# Patient Record
Sex: Male | Born: 1950 | Race: White | Hispanic: No | Smoking: Current some day smoker
Health system: Southern US, Community
[De-identification: ages and names within clinical notes are randomized; demographics above are authoritative.]

## PROBLEM LIST (undated history)

## (undated) DIAGNOSIS — Z8719 Personal history of other diseases of the digestive system: Secondary | ICD-10-CM

## (undated) DIAGNOSIS — F419 Anxiety disorder, unspecified: Secondary | ICD-10-CM

## (undated) DIAGNOSIS — I428 Other cardiomyopathies: Secondary | ICD-10-CM

## (undated) DIAGNOSIS — I5022 Chronic systolic (congestive) heart failure: Secondary | ICD-10-CM

## (undated) DIAGNOSIS — I10 Essential (primary) hypertension: Secondary | ICD-10-CM

## (undated) DIAGNOSIS — Z91199 Patient's noncompliance with other medical treatment and regimen due to unspecified reason: Secondary | ICD-10-CM

## (undated) DIAGNOSIS — I729 Aneurysm of unspecified site: Secondary | ICD-10-CM

## (undated) DIAGNOSIS — R748 Abnormal levels of other serum enzymes: Secondary | ICD-10-CM

## (undated) DIAGNOSIS — E785 Hyperlipidemia, unspecified: Secondary | ICD-10-CM

## (undated) DIAGNOSIS — M1990 Unspecified osteoarthritis, unspecified site: Secondary | ICD-10-CM

## (undated) DIAGNOSIS — Z9119 Patient's noncompliance with other medical treatment and regimen: Secondary | ICD-10-CM

## (undated) DIAGNOSIS — I4892 Unspecified atrial flutter: Secondary | ICD-10-CM

## (undated) DIAGNOSIS — R001 Bradycardia, unspecified: Secondary | ICD-10-CM

## (undated) DIAGNOSIS — Z720 Tobacco use: Secondary | ICD-10-CM

## (undated) DIAGNOSIS — I2510 Atherosclerotic heart disease of native coronary artery without angina pectoris: Secondary | ICD-10-CM

## (undated) DIAGNOSIS — Z87448 Personal history of other diseases of urinary system: Secondary | ICD-10-CM

## (undated) DIAGNOSIS — I480 Paroxysmal atrial fibrillation: Secondary | ICD-10-CM

## (undated) DIAGNOSIS — T81718A Complication of other artery following a procedure, not elsewhere classified, initial encounter: Secondary | ICD-10-CM

## (undated) HISTORY — DX: Anxiety disorder, unspecified: F41.9

## (undated) HISTORY — DX: Personal history of other diseases of the digestive system: Z87.19

## (undated) HISTORY — DX: Personal history of other diseases of urinary system: Z87.448

## (undated) HISTORY — PX: HERNIA REPAIR: SHX51

## (undated) HISTORY — PX: CHOLECYSTECTOMY: SHX55

## (undated) HISTORY — DX: Unspecified osteoarthritis, unspecified site: M19.90

## (undated) HISTORY — DX: Other cardiomyopathies: I42.8

---
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
## 2003-02-24 ENCOUNTER — Emergency Department (HOSPITAL_COMMUNITY): Admission: EM | Admit: 2003-02-24 | Discharge: 2003-02-24 | Payer: Self-pay | Admitting: Emergency Medicine

## 2003-02-24 ENCOUNTER — Encounter: Payer: Self-pay | Admitting: Emergency Medicine

## 2003-03-23 ENCOUNTER — Encounter (INDEPENDENT_AMBULATORY_CARE_PROVIDER_SITE_OTHER): Payer: Self-pay | Admitting: Specialist

## 2003-03-23 ENCOUNTER — Ambulatory Visit (HOSPITAL_BASED_OUTPATIENT_CLINIC_OR_DEPARTMENT_OTHER): Admission: RE | Admit: 2003-03-23 | Discharge: 2003-03-23 | Payer: Self-pay | Admitting: Urology

## 2003-03-26 ENCOUNTER — Encounter: Payer: Self-pay | Admitting: Emergency Medicine

## 2003-03-26 ENCOUNTER — Emergency Department (HOSPITAL_COMMUNITY): Admission: EM | Admit: 2003-03-26 | Discharge: 2003-03-26 | Payer: Self-pay | Admitting: Emergency Medicine

## 2003-03-27 ENCOUNTER — Observation Stay (HOSPITAL_COMMUNITY): Admission: RE | Admit: 2003-03-27 | Discharge: 2003-03-28 | Payer: Self-pay | Admitting: Urology

## 2003-03-27 ENCOUNTER — Encounter: Payer: Self-pay | Admitting: Urology

## 2009-05-22 ENCOUNTER — Ambulatory Visit (HOSPITAL_COMMUNITY): Admission: RE | Admit: 2009-05-22 | Discharge: 2009-05-22 | Payer: Self-pay | Admitting: Surgery

## 2009-05-22 ENCOUNTER — Encounter: Payer: Self-pay | Admitting: Internal Medicine

## 2009-05-23 DIAGNOSIS — M129 Arthropathy, unspecified: Secondary | ICD-10-CM | POA: Insufficient documentation

## 2009-05-23 DIAGNOSIS — F411 Generalized anxiety disorder: Secondary | ICD-10-CM | POA: Insufficient documentation

## 2009-05-23 DIAGNOSIS — Z87448 Personal history of other diseases of urinary system: Secondary | ICD-10-CM | POA: Insufficient documentation

## 2009-05-23 DIAGNOSIS — Z87898 Personal history of other specified conditions: Secondary | ICD-10-CM | POA: Insufficient documentation

## 2009-05-24 ENCOUNTER — Ambulatory Visit: Payer: Self-pay | Admitting: Internal Medicine

## 2009-05-24 DIAGNOSIS — I4892 Unspecified atrial flutter: Secondary | ICD-10-CM | POA: Insufficient documentation

## 2009-05-24 LAB — CONVERTED CEMR LAB
Free T4: 1 ng/dL (ref 0.6–1.6)
TSH: 0.98 microintl units/mL (ref 0.35–5.50)

## 2009-05-25 ENCOUNTER — Encounter: Payer: Self-pay | Admitting: Internal Medicine

## 2009-05-25 ENCOUNTER — Ambulatory Visit: Payer: Self-pay

## 2009-05-31 ENCOUNTER — Telehealth: Payer: Self-pay | Admitting: Internal Medicine

## 2009-06-06 ENCOUNTER — Encounter (INDEPENDENT_AMBULATORY_CARE_PROVIDER_SITE_OTHER): Payer: Self-pay | Admitting: *Deleted

## 2009-07-05 ENCOUNTER — Ambulatory Visit (HOSPITAL_COMMUNITY): Admission: RE | Admit: 2009-07-05 | Discharge: 2009-07-05 | Payer: Self-pay | Admitting: Surgery

## 2009-07-27 ENCOUNTER — Encounter: Payer: Self-pay | Admitting: Internal Medicine

## 2009-07-30 ENCOUNTER — Ambulatory Visit: Payer: Self-pay | Admitting: Internal Medicine

## 2010-10-15 NOTE — Assessment & Plan Note (Signed)
Summary: per check out/sf   Visit Type:  Follow-up Referring Deniah Saia:  Harriette Bouillon, MD Primary Analeya Luallen:  Dr.Fred Andrey Campanile   History of Present Illness: The patient presents today for routine electrophysiology followup. He reports doing very well since last being seen in our clinic. He underwent R inguinal hernia repair recently, without cardiac events.  The patient denies symptoms of palpitations, chest pain, shortness of breath, orthopnea, PND, lower extremity edema, dizziness, presyncope, syncope, or neurologic sequela. The patient is tolerating medications without difficulties and is otherwise without complaint today.   Current Medications (verified): 1)  Xanax 1 Mg Tabs (Alprazolam) .Marland Kitchen.. 1 By Mouth At Bedtime As Needed 2)  Cardizem Cd 180 Mg Xr24h-Cap (Diltiazem Hcl Coated Beads) .... One By Mouth Once Daily  Allergies (verified): No Known Drug Allergies  Past History:  Past Medical History: Reviewed history from 05/24/2009 and no changes required. ANXIETY (ICD-300.00) ARTHRITIS (ICD-716.90) INGUINAL HERNIA, HX OF (ICD-V13.8) ATRIAL ARRHYTHMIAS (ICD-427.89)  Past Surgical History: L inguinal hernia repair 92 R inguinal hernia repair 2004, 2010 Appendectomy  Family History: Reviewed history from 05/24/2009 and no changes required. Father died in a car wreck.  Mother is alive at age 26.  Brother is alive at age 20.  Denies FH of early CAD or sudden death.  Mother has atrial fibrillation.   Social History: Reviewed history from 05/24/2009 and no changes required. Lives in [place] with wife.  4 children (all healthy) Works at [employer] in [place]. Tobacco Use - Yes.  1/2 PPD, trying to quit Alcohol Use - 1-2 beers every few days Drug Use - no  Review of Systems       All systems are reviewed and negative except as listed in the HPI.   Vital Signs:  Patient profile:   60 year old male Height:      67 inches Weight:      146 pounds BMI:      22.95 Pulse rate:   59 / minute BP sitting:   126 / 72  (left arm) Cuff size:   regular  Vitals Entered By: Hardin Negus, RMA (July 30, 2009 3:32 PM)  Physical Exam  General:  Well developed, well nourished, in no acute distress. Head:  normocephalic and atraumatic Eyes:  PERRLA/EOM intact; conjunctiva and lids normal. Nose:  no deformity, discharge, inflammation, or lesions Mouth:  Teeth, gums and palate normal. Oral mucosa normal. Neck:  Neck supple, no JVD. No masses, thyromegaly or abnormal cervical nodes. Lungs:  Clear bilaterally to auscultation and percussion. Heart:  Non-displaced PMI, chest non-tender; regular rate and rhythm, S1, S2 without murmurs, rubs or gallops. Carotid upstroke normal, no bruit. Normal abdominal aortic size, no bruits. Femorals normal pulses, no bruits. Pedals normal pulses. No edema, no varicosities. Abdomen:  Bowel sounds positive; abdomen soft and non-tender without masses, organomegaly, or hernias noted. No hepatosplenomegaly.  Healing surgical scar Msk:  Back normal, normal gait. Muscle strength and tone normal. Pulses:  pulses normal in all 4 extremities Extremities:  No clubbing or cyanosis. Neurologic:  Alert and oriented x 3. Skin:  Intact without lesions or rashes. Cervical Nodes:  no significant adenopathy Psych:  Normal affect.   EKG  Procedure date:  07/30/2009  Findings:      sinus rhythm 60 bpm, incomplete RBBB  Impression & Recommendations:  Problem # 1:  ATRIAL FLUTTER (ICD-427.32) The patient presents for follow-up of his atrial arrhythmias.  He has done well without symptoms of further arrhythmias. His recent echo was normal.  He was previously discovered to have atrial arrhythmias including PACs, atrial flutter, and afib documented on a preoperative EKG 05/22/09.  He presents today in sinus rhythm.  We will continue rate control with diltiazem longterm.  His CHADS 2 score is 0.  I would therefore recommend ASA 325mg  daily  for stroke prevention longterm.  He will return if he develops sypmtoms of arrhythmia.  Otherwise he will follow closely with his PCP.  Patient Instructions: 1)  Return as needed

## 2010-10-15 NOTE — Letter (Signed)
Summary: Appointment - Reschedule  [facility], Main Office  [address]   [place], [place] [address]   Phone: [phone]  Fax: [phone]     June 06, 2009 MRN: [id number]   Peter Mclean [address] [place], [place]  [address]   Dear [name],   Due to a change in our office schedule, your appointment on June 29, 2009 at  4:00 pm  must be changed.  It is very important that we reach you to reschedule this appointment.   We look forward to participating in your health care needs. Please contact us at the number listed above at your earliest convenience to reschedule this appointment.  Sincerely,  Burnard Leigh [facility] Scheduling Team

## 2010-10-15 NOTE — Miscellaneous (Signed)
  Clinical Lists Changes  Observations: Added new observation of CXR RESULTS: The lungs are clear.  The heart is normal in size.  There   is no evidence for adenopathy.    IMPRESSION:   No evidence for active chest disease. (07/02/2009 11:07) Added new observation of ECHOINTERP: Left ventricle: The cavity size was normal. Wall thickness was     normal. Systolic function was normal. The estimated ejection     fraction was in the range of 55% to 60%.   (05/25/2009 11:06)      Echocardiogram  Procedure date:  05/25/2009  Findings:      Left ventricle: The cavity size was normal. Wall thickness was     normal. Systolic function was normal. The estimated ejection     fraction was in the range of 55% to 60%.    CXR  Procedure date:  07/02/2009  Findings:      The lungs are clear.  The heart is normal in size.  There   is no evidence for adenopathy.    IMPRESSION:   No evidence for active chest disease.

## 2010-10-15 NOTE — Assessment & Plan Note (Signed)
Summary: nep/afib/aflutter/jml   Visit Type:  Initial Consult Referring Zaakirah Kistner:  Harriette Bouillon, MD Primary Treg Diemer:  Dr.Fred Andrey Campanile  CC:  atrial arrhythmias.  History of Present Illness: Peter Mclean is a pleasant 60 yo WM without significant cardiac history who presents today for preoperative clearance after being discovered to have atrial arrhythmias.  He reports being in good health recently.  He walks 1/2 mile per day without symptoms of ischemia.  He plays with grandchildren and uses a Actuary without difficulty.  The patient denies any symptoms of palpitations, chest pain, shortness of breath, orthopnea, PND, lower extremity edema, dizziness, presyncope, syncope, or neurologic sequela.  He was recently seen by anesthesia in preoperative evaluation for hernia repair.  He was observed to have very frequency PACs, nonsustained atrial flutter, and atrial fibrillation on EKG.  The patient was asymptomatic with these.  He now presents for cardiology consultation.  Current Medications (verified): 1)  Xanax 1 Mg Tabs (Alprazolam) .Marland Kitchen.. 1 By Mouth Qhs  Allergies (verified): No Known Drug Allergies  Past History:  Past Medical History: ANXIETY (ICD-300.00) ARTHRITIS (ICD-716.90) INGUINAL HERNIA, HX OF (ICD-V13.8) ATRIAL ARRHYTHMIAS (ICD-427.89)  Past Surgical History: L inguinal hernia repair 92 R inguinal hernia repair 2004 Appendectomy o9  Family History: Father died in a car wreck.  Mother is alive at age 97.  Brother is alive at age 21.  Denies FH of early CAD or sudden death.  Mother has atrial fibrillation.  Social History: Lives in [place] with wife.  4 children (all healthy) Works at [employer] in [place]. Tobacco Use - Yes.  1/2 PPD, trying to quit Alcohol Use - 1-2 beers every few days Drug Use - no  Review of Systems       All systems are reviewed and negative except as listed in the HPI.   Vital Signs:  Patient profile:   60 year old  male Height:      67 inches Weight:      142 pounds BMI:     22.32 Pulse rate:   68 / minute Pulse rhythm:   regular BP sitting:   132 / 92  (left arm) Cuff size:   large  Vitals Entered By: Flonnie Overman (May 24, 2009 9:59 AM)  Physical Exam  General:  Well developed, well nourished, in no acute distress. Head:  normocephalic and atraumatic Eyes:  PERRLA/EOM intact; conjunctiva and lids normal. Nose:  no deformity, discharge, inflammation, or lesions Mouth:  Teeth, gums and palate normal. Oral mucosa normal. Neck:  Neck supple, no JVD. No masses, thyromegaly or abnormal cervical nodes. Lungs:  Clear bilaterally to auscultation and percussion. Heart:  Non-displaced PMI, chest non-tender; regular rate and rhythm, S1, S2 without murmurs, rubs or gallops. Carotid upstroke normal, no bruit. Normal abdominal aortic size, no bruits. Femorals normal pulses, no bruits. Pedals normal pulses. No edema, no varicosities. Abdomen:  Bowel sounds positive; abdomen soft and non-tender without masses, organomegaly,. No hepatosplenomegaly. Msk:  Back normal, normal gait. Muscle strength and tone normal. Pulses:  pulses normal in all 4 extremities Extremities:  No clubbing or cyanosis. Neurologic:  Alert and oriented x 3.  CNII-XII intact, strength/sensation are intact Skin:  Intact without lesions or rashes. Cervical Nodes:  no significant adenopathy Psych:  Normal affect.   EKG  Procedure date:  05/22/2009  Findings:      Multiple EKGs were obtained between 13:32 and 13:46 EKGs document sinus rhythm 67 bpm, no ischemic changes. Frequent PACs are noted as  well as nonsustained atrial flutter and atrial fibrillation  Impression & Recommendations:  Problem # 1:  ATRIAL FLUTTER (ICD-427.32) The patient presents for preoperative clearance after recently being discovered to have atrial arrhythmias including PACs, atrial flutter, and afib documented on a preoperative EKG 05/22/09.  He presents  today in sinus rhythm.  He has been asymptomatic with these atrial arrhythmias.  At this point, I would recommend rate control with diltiazem.  We will start diltiazem 180mg  daily.  We will also obtain TFTs and an echo to evaluate for reversible causes of his atrial arrhythmias.   His CHADS 2 score is 0.  I would therefore recommend ASA 325mg  daily after his inguinal hernia repair for stroke prevention longterm.  Perioperative beta blockade may be necessary. As the patient is low risk for CAD and has no symtoms of ischemia, I would not recommend further ischemic workup as long as his her EF is preserved.  Other Orders: TLB-T4 (Thyrox), Free [phone]) TLB-TSH (Thyroid Stimulating Hormone) (84443-TSH) Echocardiogram (Echo)  Patient Instructions: 1)  Your physician recommends that you schedule a follow-up appointment in: 4 weeks with Dr Johney Frame 2)  Your physician has recommended you make the following change in your medication: start Cardizem 180mg  one daily and start an Aspirin 325mg  one daily after your procedure 3)  Your physician has requested that you have an echocardiogram.  Echocardiography is a painless test that uses sound waves to create images of your heart. It provides your doctor with information about the size and shape of your heart and how well your heart's chambers and valves are working.  This procedure takes approximately one hour. There are no restrictions for this procedure. Prescriptions: CARDIZEM CD 180 MG XR24H-CAP (DILTIAZEM HCL COATED BEADS) one by mouth once daily  #30 x 11   Entered by:   Dennis Bast, RN, BSN   Authorized by:   Hillis Range, MD   Signed by:   Dennis Bast, RN, BSN on 05/24/2009   Method used:   Electronically to        [facility]* (retail)       [address] [place], [place]  [address]       Ph: [phone] or [phone]       Fax: [phone]   RxID:   [id number]

## 2010-10-15 NOTE — Progress Notes (Signed)
Summary: surgical clearance  Phone Note From Other Clinic   Caller: Nurse Summary of Call: Per Germaine with Dr Luisa Hart please fax over letter that pt is cleared for hernia surgery. fax [phone] phone. Initial call taken by: Edman Circle,  May 31, 2009 3:51 PM  Follow-up for Phone Call        Will dictate clearance letter and fax today. Follow-up by: Hillis Range, MD,  June 04, 2009 9:07 AM

## 2010-10-30 ENCOUNTER — Other Ambulatory Visit: Payer: Self-pay | Admitting: Gastroenterology

## 2010-10-30 DIAGNOSIS — R7989 Other specified abnormal findings of blood chemistry: Secondary | ICD-10-CM

## 2010-11-06 ENCOUNTER — Ambulatory Visit
Admission: RE | Admit: 2010-11-06 | Discharge: 2010-11-06 | Disposition: A | Discharge status: Home / Self Care | Payer: PRIVATE HEALTH INSURANCE | Source: Ambulatory Visit | Attending: Gastroenterology | Admitting: Gastroenterology

## 2010-11-06 DIAGNOSIS — R7989 Other specified abnormal findings of blood chemistry: Secondary | ICD-10-CM

## 2010-12-03 ENCOUNTER — Other Ambulatory Visit: Payer: Self-pay | Admitting: General Surgery

## 2010-12-03 ENCOUNTER — Encounter (HOSPITAL_COMMUNITY): Payer: PRIVATE HEALTH INSURANCE

## 2010-12-03 ENCOUNTER — Other Ambulatory Visit (HOSPITAL_COMMUNITY): Payer: Self-pay | Admitting: General Surgery

## 2010-12-03 ENCOUNTER — Ambulatory Visit (HOSPITAL_COMMUNITY)
Admission: RE | Admit: 2010-12-03 | Discharge: 2010-12-03 | Disposition: A | Discharge status: Home / Self Care | Payer: PRIVATE HEALTH INSURANCE | Source: Ambulatory Visit | Attending: General Surgery | Admitting: General Surgery

## 2010-12-03 DIAGNOSIS — Z01818 Encounter for other preprocedural examination: Secondary | ICD-10-CM

## 2010-12-03 DIAGNOSIS — Z01810 Encounter for preprocedural cardiovascular examination: Secondary | ICD-10-CM | POA: Insufficient documentation

## 2010-12-03 DIAGNOSIS — Z01812 Encounter for preprocedural laboratory examination: Secondary | ICD-10-CM | POA: Insufficient documentation

## 2010-12-03 DIAGNOSIS — I498 Other specified cardiac arrhythmias: Secondary | ICD-10-CM | POA: Insufficient documentation

## 2010-12-03 LAB — DIFFERENTIAL
Basophils Absolute: 0 10*3/uL (ref 0.0–0.1)
Basophils Relative: 0 % (ref 0–1)
Eosinophils Absolute: 0.3 10*3/uL (ref 0.0–0.7)
Eosinophils Relative: 3 % (ref 0–5)
Lymphocytes Relative: 34 % (ref 12–46)
Lymphs Abs: 3.5 10*3/uL (ref 0.7–4.0)
Monocytes Absolute: 1 10*3/uL (ref 0.1–1.0)
Monocytes Relative: 9 % (ref 3–12)
Neutro Abs: 5.6 10*3/uL (ref 1.7–7.7)
Neutrophils Relative %: 54 % (ref 43–77)

## 2010-12-03 LAB — COMPREHENSIVE METABOLIC PANEL
ALT: 24 U/L (ref 0–53)
AST: 24 U/L (ref 0–37)
Albumin: 3.9 g/dL (ref 3.5–5.2)
Alkaline Phosphatase: 89 U/L (ref 39–117)
BUN: 18 mg/dL (ref 6–23)
CO2: 28 mEq/L (ref 19–32)
Calcium: 9.4 mg/dL (ref 8.4–10.5)
Chloride: 106 mEq/L (ref 96–112)
Creatinine, Ser: 1.01 mg/dL (ref 0.4–1.5)
GFR calc Af Amer: >60 mL/min (ref >60)
GFR calc non Af Amer: >60 mL/min (ref >60)
Glucose, Bld: 90 mg/dL (ref 70–99)
Potassium: 4.1 mEq/L (ref 3.5–5.1)
Sodium: 141 mEq/L (ref 135–145)
Total Bilirubin: 0.9 mg/dL (ref 0.3–1.2)
Total Protein: 7.1 g/dL (ref 6.0–8.3)

## 2010-12-03 LAB — CBC
HCT: 46.1 % (ref 39.0–52.0)
Hemoglobin: 15.3 g/dL (ref 13.0–17.0)
MCH: 30.4 pg (ref 26.0–34.0)
MCHC: 33.2 g/dL (ref 30.0–36.0)
MCV: 91.5 fL (ref 78.0–100.0)
Platelets: 203 10*3/uL (ref 150–400)
RBC: 5.04 MIL/uL (ref 4.22–5.81)
RDW: 13.2 % (ref 11.5–15.5)
WBC: 10.4 10*3/uL (ref 4.0–10.5)

## 2010-12-03 LAB — SURGICAL PCR SCREEN
MRSA, PCR: NEGATIVE
Staphylococcus aureus: NEGATIVE

## 2010-12-11 ENCOUNTER — Other Ambulatory Visit: Payer: Self-pay | Admitting: General Surgery

## 2010-12-11 ENCOUNTER — Ambulatory Visit (HOSPITAL_COMMUNITY)
Admission: RE | Admit: 2010-12-11 | Discharge: 2010-12-11 | Disposition: A | Discharge status: Home / Self Care | Payer: PRIVATE HEALTH INSURANCE | Source: Ambulatory Visit | Attending: General Surgery | Admitting: General Surgery

## 2010-12-11 ENCOUNTER — Ambulatory Visit (HOSPITAL_COMMUNITY): Payer: PRIVATE HEALTH INSURANCE

## 2010-12-11 DIAGNOSIS — K219 Gastro-esophageal reflux disease without esophagitis: Secondary | ICD-10-CM | POA: Insufficient documentation

## 2010-12-11 DIAGNOSIS — K8010 Calculus of gallbladder with chronic cholecystitis without obstruction: Secondary | ICD-10-CM | POA: Insufficient documentation

## 2010-12-11 DIAGNOSIS — Z79899 Other long term (current) drug therapy: Secondary | ICD-10-CM | POA: Insufficient documentation

## 2010-12-11 DIAGNOSIS — I10 Essential (primary) hypertension: Secondary | ICD-10-CM | POA: Insufficient documentation

## 2010-12-12 NOTE — Op Note (Signed)
NAME:  Peter Mclean, Peter Mclean                ACCOUNT NO.:  [id number]  MEDICAL RECORD NO.:  [id number]           PATIENT TYPE:  O  LOCATION:  DAYL                         FACILITY[facility]  PHYSICIAN:  Juanetta Gosling, MDDATE OF BIRTH:  May 09, 1951  DATE OF PROCEDURE: DATE OF DISCHARGE:                              OPERATIVE REPORT   PREOPERATIVE DIAGNOSES: 1. Symptomatic cholelithiasis. 2. History of choledocholithiasis.  POSTOPERATIVE DIAGNOSES: 1. Symptomatic cholelithiasis. 2. History of choledocholithiasis. 3. Chronic cholecystitis.  PROCEDURE:  Laparoscopic cholecystectomy with intraoperative cholangiogram.  SURGEON:  Juanetta Gosling, MD.  ASSISTANT:  Angelia Mould. Derrell Lolling, M.D.  ANESTHESIA:  General.  SPECIMENS:  Gallbladder to pathology.  DISPOSITION OF SPECIMEN:  Pathology.  ESTIMATED BLOOD LOSS:  Minimal.  COMPLICATIONS:  None.  DRAINS:  None.  DISPOSITION:  The patient to recovery room in stable condition.  INDICATIONS:  The patient is a 60 year old male who in February had some epigastric and right upper quadrant pain.  He was noted to have an elevated bilirubin and alkaline phosphatase at that time.  His symptoms resolved and in the middle of February he was evaluated by Dr. Elnoria Howard and he had normal liver function tests.  When I saw him at the end of February, he had occasional right upper quadrant pain and right back pain.  He underwent evaluation including ultrasound and that showed a mixture of stones and shadowing debris with the largest measuring 9 mm, a wall of 2 mm, no sonographic Murphy sign, and common duct measuring 8 mm.  On his exam, he was soft and nontender at that point.  His LFTs were normal and were normal preoperatively.  He continued to have pain before we did his operation.  We discussed the risks and benefits of a laparoscopic cholecystectomy.  DESCRIPTION OF PROCEDURE:  After informed consent was obtained, the patient was taken to the  operating room.  He was administered 1 g of intravenous cefoxitin.  Sequential compression devices were placed on lower extremities prior to induction of anesthesia.  He was then placed under general anesthesia without complications.  His abdomen was then prepped and draped in standard sterile surgical fashion.  A surgical time-out was then performed.  I infiltrated 0.25% Marcaine below his umbilicus.  I made an incision with a #11 blade.  This was carried out down to level of his fascia.  I made an incision in his fascia, then I entered the peritoneum bluntly. A 0 Vicryl pursestring suture was placed through the fascia.  I then introduced a Hassan trocar and the abdomen was insufflated to 15 mmHg pressure.  Three further 5-mm trocars were inserted in the epigastrium and right upper quadrant under direct vision after infiltration of local anesthetic without complication.  His gallbladder was noted to be very adherent to his omentum.  This was taken down with blunt dissection and cutting scissors to remove the adhesions to his duodenum.  Eventually we were able to retract the gallbladder cephalad and lateral.  The gallbladder cone down to a small area, then appeared very bulbous again and it took about 45 minutes to dissect this area to  obtain the critical view of safety where eventually I was able to clip the artery and divide it.  It appeared that his cystic duct was fused with his common duct and had a fairly wide mouth at that area and there really was no cystic duct, it looked like the gallbladder was essentially fused down there. I dissected this very free.  I then placed a clip distally.  I made an incision into the gallbladder itself and I milked the fair amount of stones back at that point.  I cleared this to the best of my ability.  I tried to do a cholangiogram with a Cook catheter but was then able to do this.  I then placed a Reddick catheter in place and did a cholangiogram.   This cholangiogram showed that this was indeed the cystic duct, showed no filling defects in the common duct at all and flow into the duodenum and filling of both sides of the liver, indicating that there were no stones present.  I explored this distal portion of the gallbladder and I removed all of the stones that I could from this area.  It appeared to me that there were no more stones present.  I did not want to take this down any farther as where this was adherent to the common duct, so I divided this and then placed 2 Endoloops as well as a couple of clips across this area.  The Endoloops were of the PDS variety.  This was all closed upon completion.  I then removed the gallbladder from the liver bed without difficulty.  This was placed in an EndoCatch bag.  I think there was spillage of some small amount of stones as well as some white bile during this portion of the procedure as well.  These were all evacuated and copious irrigation was performed.  Hemostasis was obtained on the gallbladder.  I did place a piece of Surgicel snow on the gallbladder bed.  The gallbladder was then removed from the umbilicus and the pursestring was closed down.  There was no evidence of any entry injury.  I then evacuated all the fluid, I removed the remainder of the trocars.  I had sized the epigastric up to 10 mm during the procedure as well in order to remove the stones.  The incisions were then all closed with a 4-0 Monocryl in subcuticular fashion.  Dermabond was placed on all the incisions except for the epigastric incision which was closed with Steri-Strips and a sterile dressing.  He tolerated this well, was extubated in the operating room, and transferred to recovery room in stable condition.     Juanetta Gosling, MD     MCW/MEDQ  D:  12/11/2010  T:  12/11/2010  Job:  [id number]  cc:   Gloriajean Dell. Andrey Campanile, M.D. Fax: [phone]  Jordan Hawks. Elnoria Howard, MD Fax: [phone]  Electronically Signed by  Emelia Loron MD on 12/12/2010 07:18:20 AM

## 2010-12-19 LAB — COMPREHENSIVE METABOLIC PANEL
ALT: 30 U/L (ref 0–53)
AST: 28 U/L (ref 0–37)
Albumin: 4.2 g/dL (ref 3.5–5.2)
Alkaline Phosphatase: 89 U/L (ref 39–117)
BUN: 15 mg/dL (ref 6–23)
CO2: 28 mEq/L (ref 19–32)
Calcium: 9.4 mg/dL (ref 8.4–10.5)
Chloride: 107 mEq/L (ref 96–112)
Creatinine, Ser: 1.04 mg/dL (ref 0.4–1.5)
GFR calc Af Amer: >60 mL/min (ref >60)
GFR calc non Af Amer: >60 mL/min (ref >60)
Glucose, Bld: 92 mg/dL (ref 70–99)
Potassium: 4.3 mEq/L (ref 3.5–5.1)
Sodium: 141 mEq/L (ref 135–145)
Total Bilirubin: 1.6 mg/dL — ABNORMAL HIGH (ref 0.3–1.2)
Total Protein: 6.5 g/dL (ref 6.0–8.3)

## 2010-12-19 LAB — CBC
HCT: 47.1 % (ref 39.0–52.0)
Hemoglobin: 16.3 g/dL (ref 13.0–17.0)
MCHC: 34.5 g/dL (ref 30.0–36.0)
MCV: 92.7 fL (ref 78.0–100.0)
Platelets: 170 10*3/uL (ref 150–400)
RBC: 5.08 MIL/uL (ref 4.22–5.81)
RDW: 13.5 % (ref 11.5–15.5)
WBC: 8.7 10*3/uL (ref 4.0–10.5)

## 2010-12-19 LAB — DIFFERENTIAL
Basophils Absolute: 0 10*3/uL (ref 0.0–0.1)
Basophils Relative: 1 % (ref 0–1)
Eosinophils Absolute: 0.2 10*3/uL (ref 0.0–0.7)
Eosinophils Relative: 2 % (ref 0–5)
Lymphocytes Relative: 29 % (ref 12–46)
Lymphs Abs: 2.5 10*3/uL (ref 0.7–4.0)
Monocytes Absolute: 0.7 10*3/uL (ref 0.1–1.0)
Monocytes Relative: 9 % (ref 3–12)
Neutro Abs: 5.2 10*3/uL (ref 1.7–7.7)
Neutrophils Relative %: 60 % (ref 43–77)

## 2011-01-13 ENCOUNTER — Encounter (INDEPENDENT_AMBULATORY_CARE_PROVIDER_SITE_OTHER): Payer: Self-pay | Admitting: General Surgery

## 2011-01-31 NOTE — Discharge Summary (Signed)
NAME:  Peter Mclean, Peter Mclean                          ACCOUNT NO.:  [id number]   MEDICAL RECORD NO.:  [id number]                   PATIENT TYPE:  OBV   LOCATION:  0378                                 FACILITY:  [facility]   PHYSICIAN:  Claudette Laws, M.D.               DATE OF BIRTH:  20-Dec-1950   DATE OF ADMISSION:  03/27/2003  DATE OF DISCHARGE:  03/28/2003                                 DISCHARGE SUMMARY   HISTORY OF PRESENT ILLNESS:  This is a 60 year old man who is about four  days out from diagnostic ureteroscopy of his right kidney for lateralizing  gross hematuria.  He had a double-J stent put in for 24 hours.  It was  removed the next day.  However, over the next two to three days he had  persistent renal colic, difficulty voiding, not feeling well in general.  He  had a CT scan in the ER over the weekend the day before we saw him showing a  mild right hydronephrosis and some perinephric fluid.  He was seen by me in  the office on Monday, March 27, 2003, confirming a right hydronephrosis,  persistent perinephric fluid.  He was still in a fair amount of pain, and I  recommended that we take him back to surgery and re-insert a double J stent.  He understands and agrees with the proposed treatment.   LABORATORY DATA:  His white cell count was 15,900, while in the emergency  room.  Hemoglobin 13.8, hematocrit 40.  Electrolytes showed a creatinine of  1.3.   HOSPITAL COURSE:  The patient was seen by me in the afternoon on March 27, 2003.  He was taken right to surgery later on that afternoon.  He had a  blood clot right at the right ureteral orifice of which we removed.  We then  performed a retrograde pyelogram showing dilated calyces and what appeared  to be a blood clot in the right renal pelvis.  There was no obvious  extravasation.  I then was able to pass up a glide wire and then a 6 [place]  26 cm double J stent was placed under fluoroscopic control, also a Foley  catheter was  placed postoperatively.  The patient woke up feeling fine, much  better.  He was observed overnight.  By the next morning he had a  temperature of 99.2, no pain.  We removed the Foley catheter, and sent him  home with plans now to keep the double J stent in for at least two to three  weeks if possible.  I am anticipating that the apparent right renal pelvic  blood clot will lyse on its own.   IMPRESSION:  1. Right hydronephrosis.  2. Lateralizing hematuria, right kidney, unknown etiology.  3. Status post diagnostic ureteroscopy and double-J stent.  4. Renal colic.   OPERATION:  Cystoscopy, right retrograde pyelogram, and insertion of  a 6  French 26 cm double-J stent on March 27, 2003.   COMPLICATIONS:  None.   CONDITION ON DISCHARGE:  Improved.   DISCHARGE MEDICATIONS:  1. Cipro XR 500 mg one a day #7 for antibiotic coverage.  2. Tylox #25 p.r.n. pain.  3. Peridium 200 mg p.r.n. burning or bladder spasm.   DIET:  Regular diet, force fluids.   ACTIVITY:  As tolerated.  He would like to return to work tomorrow.   FOLLOWUP:  Otherwise, return to see me in two weeks for followup.                                               Claudette Laws, M.D.    RFS/MEDQ  D:  03/28/2003  T:  03/28/2003  Job:  [id number]

## 2011-01-31 NOTE — Op Note (Signed)
NAME:  Peter Mclean, Peter Mclean                          ACCOUNT NO.:  [id number]   MEDICAL RECORD NO.:  [id number]                   PATIENT TYPE:  OBV   LOCATION:  0378                                 FACILITY:  [facility]   PHYSICIAN:  Claudette Laws, M.D.               DATE OF BIRTH:  Jan 01, 1951   DATE OF PROCEDURE:  03/27/2003  DATE OF DISCHARGE:                                 OPERATIVE REPORT   PREOPERATIVE DIAGNOSES:  1. Right hydronephrosis with renal colic.  2. Status post ureteroscopy with a double-J stent for 24 hours.   POSTOPERATIVE DIAGNOSES:  1. Right hydronephrosis with renal colic.  2. Status post ureteroscopy with a double-J stent for 24 hours.   OPERATION:  1. Cystoscopy.  2. Right retrograde pyelogram.  3. Insertion of a 6 French 26 cm double-J stent.   SURGEON:  Claudette Laws, M.D.   HISTORY:  This is a 60 year old man with lateralizing hematuria from the  right kidney.  He underwent diagnostic ureteroscopy four days ago with  insertion of a 6 French 26 cm double-J stent with the string attached.  He  could not tolerate the stent very well and 24 hours later, I removed it.  Since then, he has been having right flank pain, nausea, an inability to  take in any fluids, some difficulty urinating.  He was in the ER over the  weekend.  A CAT scan showed a mild right hydronephrosis and some perinephric  fluid.  At the time of his ureteroscopy, he did have some  pyelovenous/pyelolymphatic back flow.  He was having a lot of pain.  I  repeated the ultrasound today.  Again, he had a mild hydronephrosis and some  perinephric fluid again.  We discussed treatment options, and I thought we  ought to go right ahead and put up a double-J stent, retrograde him.  He  understands and agrees to the proposed surgery.  He also understands that if  for some reason I am unable to put in a double-J stent, we may need to  consider a percutaneous nephrostomy.   DESCRIPTION OF PROCEDURE:   The patient was prepped and draped in the dorsal  lithotomy position under general intubated anesthesia.  Cystoscopy was  performed with a 22 French cystoscope.  He had a normal anterior urethra,  hemorrhagic prostate.  The bladder itself showed a blood clot right over the  right ureteral orifice.  This was removed with grasping forceps.  He had  some edema, erythema of the right ureteral orifice which was somewhat  patulous.   I then placed up a 6 [place] open-ended ureteral catheter over a Glidewire  without any trouble.  This seemed to go up to the kidney nicely.  We then  obtained a retrograde pyelogram which showed dilated calices.  It was  difficult to outline the renal pelvis.  Conceivably, he may have a blood  clot in the renal pelvis.   We then backed out the open-ended catheter and placed a 6 French 26 cm  double-J stent under fluoroscopic control.  The distal end seemed to curl up  in the renal pelvis.  The proximal end was curled up in the pelvis.  The  distal end  was curled up in the bladder.  The bladder was emptied.  A B&O suppository  was placed per rectum for anesthetic purposes.  A #18 French Foley catheter  was then placed to straight drainage.  He was taken back to the recovery  room in satisfactory condition.                                               Claudette Laws, M.D.    RFS/MEDQ  D:  03/27/2003  T:  03/27/2003  Job:  [id number]

## 2011-01-31 NOTE — Letter (Signed)
June 04, 2009    Maisie Fus A. Cornett, M.D.  [address] [place], [address]  [place], [place] [address]   RE:  Peter Mclean, Peter Mclean  MRN:  [id number]  /  DOB:  07-Jun-1951   Dear Elijah Birk,   It was my pleasure to see your patient, Peter Mclean, in cardiology  consultation.  As you recall, Peter Mclean is a pleasant 60 year old  gentleman without significant cardiac history.  He is noted to have an  inguinal hernia and was evaluated by you with plans for surgical repair.  Upon preoperative consultation by Anesthesia, the patient was noted to  have very frequent premature atrial contractions, nonsustained atrial  flutter, and atrial fibrillation documented on EKG.  He was completely  asymptomatic with these.  He lives a very active lifestyle without any  symptoms of ischemia.  He has a relatively low risk profile for coronary  artery disease.  Upon evaluation in my office.  The patient was found to  be in sinus rhythm.  I did review prior electrocardiograms which  documented frequent premature atrial contractions as well as  nonsustained atrial flutter and atrial fibrillation.  As he is low risk  for stroke, I would recommend anticoagulation with aspirin 325 mg daily  long-term after his inguinal hernia repair.  I have taken the liberty of  starting the patient on diltiazem 180 mg daily.  He may require  perioperative beta blockade should he have perioperative atrial  arrhythmias.  I have obtained a thyroid profile which was normal as well  as an echocardiogram on May 25, 2009, which revealed no evidence  of structural heart disease.   At this point, I would recommend that he proceed to inguinal hernia  repair if surgically necessary without further cardiac intervention.   Please feel free to contact me if I can be of further assistance.    Sincerely,      Hillis Range, MD  Electronically Signed    JA/MedQ  DD: 06/04/2009  DT: 06/04/2009  Job #: [id number]   CC:    Gloriajean Dell. Andrey Campanile, M.D.

## 2011-01-31 NOTE — Op Note (Signed)
NAME:  Peter Mclean, CODISPOTI                          ACCOUNT NO.:  [id number]   MEDICAL RECORD NO.:  [id number]                   PATIENT TYPE:  AMB   LOCATION:  [facility]                                 FACILITY:  [facility]   PHYSICIAN:  Claudette Laws, M.D.               DATE OF BIRTH:  13-Apr-1951   DATE OF PROCEDURE:  03/23/2003  DATE OF DISCHARGE:                                 OPERATIVE REPORT   PREOPERATIVE DIAGNOSIS:  Lateralizing hematuria, right renal collecting  system.   POSTOPERATIVE DIAGNOSIS:  Lateralizing hematuria, right renal collecting  system.   OPERATION/PROCEDURE:  1. Cystoscopy with right retrograde pyelogram.  2. Diagnostic flexible ureteroscopy.  3. Right kidney.   SURGEON:  Claudette Laws, M.D.   DESCRIPTION OF PROCEDURE:  The patient was prepped and draped in the dorsal  lithotomy position under LMA anesthesia.  Rigid cystoscopy was performed.  He had a normal anterior urethra, normal small prostate.  The bladder itself  looked grossly normal.  No tumors, no calculi.  Normal ureteral orifices.  After several minutes of careful inspection, again I could see blood efflux  from the right ureteral orifice.  Nothing obvious from the left ureteral  orifice.   Initially I passed up a double-lumen ureteral catheter and we passed up  2.038 guide wires under fluoroscopic control.  Then backed out the  cystoscope and used the short rigid ureteroscope and rigid ureteroscopy was  performed of  a distal ureter to about the level of iliac vessels but the  ureter itself looked fine.  No obvious tumors, no calculi.   We then backloaded the Olympus flexible ureteroscope over one of the working  wires and under fluoroscopic control, advanced the ureteroscope up into the  renal pelvis.  At this point, for orientation purposes I did a gentle  retrograde pyelogram.  He did have some pyelolymphatic backflow.  I then  attempted to endoscope him.  We did not see anything obvious in  the renal  pelvis.  I was able to see into the upper pole calyx but there was nothing  obvious that I could see.  However, I never was able to see into the lower  infundibulum.  After several minutes, I thought I had done as much as I  could so we backed out the flexible cystoscope and then under direct vision,  visualized the rest of the ureter which looked fine.   I then put up a double-J stent for overnight purposes.  A 6-French, 26 cm  double-J stent with a string was then passed up over the wire working wire  and again it was positioned in the kidney.  Distal end was curled up in the  bladder and the string was brought out through the urethra.   The patient was then given a B&O suppository per rectum for anesthetic  purposes as well as 10 mL of Xylocaine jelly per  urethra.  He was then taken  back to the recovery room in satisfactory condition.   The plan now is to keep his double-J stent in overnight and remove it in the  morning.   At this point I am not sure exactly why he has persistent gross hematuria  from the right upper collecting system.  He may have a hemangioma which I  could not identify or possibly a small papillary tumor in one of the  calyceal systems which I did not visualize.                                               Claudette Laws, M.D.    RFS/MEDQ  D:  03/23/2003  T:  03/23/2003  Job:  [id number]

## 2011-02-23 ENCOUNTER — Emergency Department (HOSPITAL_COMMUNITY): Payer: PRIVATE HEALTH INSURANCE

## 2011-02-23 ENCOUNTER — Emergency Department (HOSPITAL_COMMUNITY)
Admission: EM | Admit: 2011-02-23 | Discharge: 2011-02-24 | Disposition: A | Discharge status: Home / Self Care | Payer: PRIVATE HEALTH INSURANCE | Attending: Emergency Medicine | Admitting: Emergency Medicine

## 2011-02-23 DIAGNOSIS — R079 Chest pain, unspecified: Secondary | ICD-10-CM | POA: Insufficient documentation

## 2011-02-23 DIAGNOSIS — Z9089 Acquired absence of other organs: Secondary | ICD-10-CM | POA: Insufficient documentation

## 2011-02-23 DIAGNOSIS — F17200 Nicotine dependence, unspecified, uncomplicated: Secondary | ICD-10-CM | POA: Insufficient documentation

## 2011-02-23 DIAGNOSIS — R0609 Other forms of dyspnea: Secondary | ICD-10-CM | POA: Insufficient documentation

## 2011-02-23 DIAGNOSIS — R0989 Other specified symptoms and signs involving the circulatory and respiratory systems: Secondary | ICD-10-CM | POA: Insufficient documentation

## 2011-02-23 LAB — TROPONIN I: Troponin I: <0.3 ng/mL (ref <0.30)

## 2011-02-23 LAB — BASIC METABOLIC PANEL
BUN: 17 mg/dL (ref 6–23)
CO2: 24 mEq/L (ref 19–32)
Calcium: 9.2 mg/dL (ref 8.4–10.5)
Chloride: 101 mEq/L (ref 96–112)
Creatinine, Ser: 0.72 mg/dL (ref 0.4–1.5)
GFR calc Af Amer: >60 mL/min (ref >60)
GFR calc non Af Amer: >60 mL/min (ref >60)
Glucose, Bld: 96 mg/dL (ref 70–99)
Potassium: 3.3 mEq/L — ABNORMAL LOW (ref 3.5–5.1)
Sodium: 136 mEq/L (ref 135–145)

## 2011-02-23 LAB — CK TOTAL AND CKMB (NOT AT ARMC)
CK, MB: 1.7 ng/mL (ref 0.3–4.0)
Relative Index: INVALID (ref 0.0–2.5)
Total CK: 75 U/L (ref 7–232)

## 2011-02-23 LAB — PRO B NATRIURETIC PEPTIDE: Pro B Natriuretic peptide (BNP): 48.7 pg/mL (ref 0–125)

## 2011-02-23 LAB — D-DIMER, QUANTITATIVE: D-Dimer, Quant: 0.3 ug/mL-FEU (ref 0.00–0.48)

## 2011-04-09 ENCOUNTER — Emergency Department: Payer: Self-pay | Admitting: Internal Medicine

## 2013-10-12 ENCOUNTER — Emergency Department (HOSPITAL_COMMUNITY): Payer: PRIVATE HEALTH INSURANCE

## 2013-10-12 ENCOUNTER — Encounter (HOSPITAL_COMMUNITY): Payer: Self-pay | Admitting: Emergency Medicine

## 2013-10-12 ENCOUNTER — Inpatient Hospital Stay (HOSPITAL_COMMUNITY)
Admission: EM | Admit: 2013-10-12 | Discharge: 2013-10-19 | DRG: 286 | Disposition: A | Discharge status: Home / Self Care | Payer: PRIVATE HEALTH INSURANCE | Attending: Cardiovascular Disease | Admitting: Cardiovascular Disease

## 2013-10-12 DIAGNOSIS — F411 Generalized anxiety disorder: Secondary | ICD-10-CM | POA: Diagnosis present

## 2013-10-12 DIAGNOSIS — I2510 Atherosclerotic heart disease of native coronary artery without angina pectoris: Secondary | ICD-10-CM | POA: Diagnosis present

## 2013-10-12 DIAGNOSIS — R0781 Pleurodynia: Secondary | ICD-10-CM

## 2013-10-12 DIAGNOSIS — G43909 Migraine, unspecified, not intractable, without status migrainosus: Secondary | ICD-10-CM | POA: Diagnosis present

## 2013-10-12 DIAGNOSIS — I10 Essential (primary) hypertension: Secondary | ICD-10-CM

## 2013-10-12 DIAGNOSIS — I509 Heart failure, unspecified: Secondary | ICD-10-CM

## 2013-10-12 DIAGNOSIS — I4819 Other persistent atrial fibrillation: Secondary | ICD-10-CM | POA: Diagnosis present

## 2013-10-12 DIAGNOSIS — F17200 Nicotine dependence, unspecified, uncomplicated: Secondary | ICD-10-CM | POA: Diagnosis present

## 2013-10-12 DIAGNOSIS — I4892 Unspecified atrial flutter: Secondary | ICD-10-CM

## 2013-10-12 DIAGNOSIS — Z720 Tobacco use: Secondary | ICD-10-CM | POA: Diagnosis present

## 2013-10-12 DIAGNOSIS — I5021 Acute systolic (congestive) heart failure: Secondary | ICD-10-CM

## 2013-10-12 DIAGNOSIS — I5023 Acute on chronic systolic (congestive) heart failure: Secondary | ICD-10-CM | POA: Diagnosis present

## 2013-10-12 DIAGNOSIS — E785 Hyperlipidemia, unspecified: Secondary | ICD-10-CM | POA: Diagnosis present

## 2013-10-12 DIAGNOSIS — IMO0002 Reserved for concepts with insufficient information to code with codable children: Secondary | ICD-10-CM | POA: Diagnosis present

## 2013-10-12 DIAGNOSIS — I4891 Unspecified atrial fibrillation: Principal | ICD-10-CM

## 2013-10-12 DIAGNOSIS — Z9119 Patient's noncompliance with other medical treatment and regimen: Secondary | ICD-10-CM

## 2013-10-12 DIAGNOSIS — I428 Other cardiomyopathies: Secondary | ICD-10-CM

## 2013-10-12 DIAGNOSIS — Z91199 Patient's noncompliance with other medical treatment and regimen due to unspecified reason: Secondary | ICD-10-CM

## 2013-10-12 HISTORY — DX: Hyperlipidemia, unspecified: E78.5

## 2013-10-12 HISTORY — DX: Patient's noncompliance with other medical treatment and regimen due to unspecified reason: Z91.199

## 2013-10-12 HISTORY — DX: Patient's noncompliance with other medical treatment and regimen: Z91.19

## 2013-10-12 HISTORY — DX: Tobacco use: Z72.0

## 2013-10-12 HISTORY — DX: Essential (primary) hypertension: I10

## 2013-10-12 LAB — TROPONIN I
Troponin I: <0.3 ng/mL (ref <0.30)
Troponin I: <0.3 ng/mL (ref <0.30)

## 2013-10-12 LAB — CBC WITH DIFFERENTIAL/PLATELET
Basophils Absolute: 0 10*3/uL (ref 0.0–0.1)
Basophils Relative: 0 % (ref 0–1)
Eosinophils Absolute: 0.1 10*3/uL (ref 0.0–0.7)
Eosinophils Relative: 1 % (ref 0–5)
HCT: 46.4 % (ref 39.0–52.0)
Hemoglobin: 15.9 g/dL (ref 13.0–17.0)
Lymphocytes Relative: 27 % (ref 12–46)
Lymphs Abs: 2.8 10*3/uL (ref 0.7–4.0)
MCH: 31 pg (ref 26.0–34.0)
MCHC: 34.3 g/dL (ref 30.0–36.0)
MCV: 90.4 fL (ref 78.0–100.0)
Monocytes Absolute: 0.8 10*3/uL (ref 0.1–1.0)
Monocytes Relative: 8 % (ref 3–12)
Neutro Abs: 6.5 10*3/uL (ref 1.7–7.7)
Neutrophils Relative %: 64 % (ref 43–77)
Platelets: 180 10*3/uL (ref 150–400)
RBC: 5.13 MIL/uL (ref 4.22–5.81)
RDW: 13.6 % (ref 11.5–15.5)
WBC: 10.2 10*3/uL (ref 4.0–10.5)

## 2013-10-12 LAB — PROTIME-INR
INR: 1.1 (ref 0.00–1.49)
Prothrombin Time: 14 seconds (ref 11.6–15.2)

## 2013-10-12 LAB — BASIC METABOLIC PANEL
BUN: 16 mg/dL (ref 6–23)
CO2: 23 mEq/L (ref 19–32)
Calcium: 9.2 mg/dL (ref 8.4–10.5)
Chloride: 106 mEq/L (ref 96–112)
Creatinine, Ser: 0.92 mg/dL (ref 0.50–1.35)
GFR calc Af Amer: >90 mL/min (ref >90)
GFR calc non Af Amer: 89 mL/min — ABNORMAL LOW (ref >90)
Glucose, Bld: 114 mg/dL — ABNORMAL HIGH (ref 70–99)
Potassium: 4.1 mEq/L (ref 3.7–5.3)
Sodium: 143 mEq/L (ref 137–147)

## 2013-10-12 LAB — APTT: aPTT: 34 seconds (ref 24–37)

## 2013-10-12 LAB — PRO B NATRIURETIC PEPTIDE: Pro B Natriuretic peptide (BNP): 3172 pg/mL — ABNORMAL HIGH (ref 0–125)

## 2013-10-12 LAB — POCT I-STAT TROPONIN I: Troponin i, poc: 0.02 ng/mL (ref 0.00–0.08)

## 2013-10-12 LAB — MRSA PCR SCREENING: MRSA by PCR: NEGATIVE

## 2013-10-12 MED ORDER — ALPRAZOLAM 0.25 MG PO TABS
1.0000 mg | ORAL_TABLET | Freq: Once | ORAL | Status: AC
  Administered 2013-10-12: 1 mg via ORAL
  Filled 2013-10-12: qty 4

## 2013-10-12 MED ORDER — IPRATROPIUM BROMIDE 0.02 % IN SOLN
0.5000 mg | Freq: Four times a day (QID) | RESPIRATORY_TRACT | Status: DC | PRN
  Filled 2013-10-12: qty 2.5

## 2013-10-12 MED ORDER — LEVALBUTEROL HCL 0.63 MG/3ML IN NEBU
0.6300 mg | INHALATION_SOLUTION | Freq: Four times a day (QID) | RESPIRATORY_TRACT | Status: DC | PRN
  Filled 2013-10-12: qty 3

## 2013-10-12 MED ORDER — SODIUM CHLORIDE 0.9 % IJ SOLN
3.0000 mL | Freq: Two times a day (BID) | INTRAMUSCULAR | Status: DC
  Administered 2013-10-12: 3 mL via INTRAVENOUS

## 2013-10-12 MED ORDER — SODIUM CHLORIDE 0.9 % IJ SOLN: 3.0000 mL | INTRAMUSCULAR | Status: DC | PRN

## 2013-10-12 MED ORDER — SODIUM CHLORIDE 0.9 % IV BOLUS (SEPSIS)
500.0000 mL | Freq: Once | INTRAVENOUS | Status: AC
  Administered 2013-10-12: 500 mL via INTRAVENOUS

## 2013-10-12 MED ORDER — APIXABAN 5 MG PO TABS
5.0000 mg | ORAL_TABLET | Freq: Two times a day (BID) | ORAL | Status: DC
  Administered 2013-10-13 – 2013-10-19 (×12): 5 mg via ORAL
  Filled 2013-10-12 (×16): qty 1

## 2013-10-12 MED ORDER — ZOLPIDEM TARTRATE 5 MG PO TABS
5.0000 mg | ORAL_TABLET | Freq: Every evening | ORAL | Status: DC | PRN
  Administered 2013-10-12: 5 mg via ORAL
  Filled 2013-10-12: qty 1

## 2013-10-12 MED ORDER — APIXABAN 5 MG PO TABS
5.0000 mg | ORAL_TABLET | Freq: Two times a day (BID) | ORAL | Status: DC
  Administered 2013-10-12: 5 mg via ORAL
  Filled 2013-10-12 (×3): qty 1

## 2013-10-12 MED ORDER — ACETAMINOPHEN 325 MG PO TABS
650.0000 mg | ORAL_TABLET | ORAL | Status: DC | PRN
Start: 2013-10-12 — End: 2013-10-19
  Administered 2013-10-12 – 2013-10-13 (×2): 650 mg via ORAL
  Filled 2013-10-12 (×2): qty 2

## 2013-10-12 MED ORDER — IPRATROPIUM-ALBUTEROL 0.5-2.5 (3) MG/3ML IN SOLN
3.0000 mL | RESPIRATORY_TRACT | Status: DC | PRN
  Filled 2013-10-12: qty 3

## 2013-10-12 MED ORDER — FUROSEMIDE 10 MG/ML IJ SOLN
40.0000 mg | Freq: Two times a day (BID) | INTRAMUSCULAR | Status: DC
  Administered 2013-10-12 – 2013-10-13 (×2): 40 mg via INTRAVENOUS
  Filled 2013-10-12 (×6): qty 4

## 2013-10-12 MED ORDER — IOHEXOL 350 MG/ML SOLN
100.0000 mL | Freq: Once | INTRAVENOUS | Status: AC | PRN
  Administered 2013-10-12: 100 mL via INTRAVENOUS

## 2013-10-12 MED ORDER — SODIUM CHLORIDE 0.9 % IV SOLN: 250.0000 mL | INTRAVENOUS | Status: DC | PRN

## 2013-10-12 MED ORDER — ALPRAZOLAM 0.25 MG PO TABS
1.0000 mg | ORAL_TABLET | Freq: Three times a day (TID) | ORAL | Status: DC | PRN
  Administered 2013-10-13 – 2013-10-17 (×2): 1 mg via ORAL
  Filled 2013-10-12: qty 2
  Filled 2013-10-12: qty 4

## 2013-10-12 MED ORDER — DILTIAZEM HCL 100 MG IV SOLR
5.0000 mg/h | INTRAVENOUS | Status: DC
  Administered 2013-10-12: 10 mg/h via INTRAVENOUS
  Administered 2013-10-13: 5 mg/h via INTRAVENOUS
  Filled 2013-10-12 (×2): qty 100

## 2013-10-12 MED ORDER — ASPIRIN EC 81 MG PO TBEC
81.0000 mg | DELAYED_RELEASE_TABLET | Freq: Every day | ORAL | Status: DC
  Administered 2013-10-12 – 2013-10-14 (×3): 81 mg via ORAL
  Filled 2013-10-12 (×4): qty 1

## 2013-10-12 MED ORDER — DILTIAZEM HCL 100 MG IV SOLR
5.0000 mg/h | INTRAVENOUS | Status: DC
  Administered 2013-10-12: 5 mg/h via INTRAVENOUS

## 2013-10-12 MED ORDER — CYCLOBENZAPRINE HCL 5 MG PO TABS
5.0000 mg | ORAL_TABLET | Freq: Every evening | ORAL | Status: DC
  Administered 2013-10-14 – 2013-10-18 (×5): 5 mg via ORAL
  Filled 2013-10-12 (×8): qty 1

## 2013-10-12 MED ORDER — ALBUTEROL SULFATE HFA 108 (90 BASE) MCG/ACT IN AERS: 2.0000 | INHALATION_SPRAY | Freq: Four times a day (QID) | RESPIRATORY_TRACT | Status: DC | PRN

## 2013-10-12 MED ORDER — HYDROCOD POLST-CHLORPHEN POLST 10-8 MG/5ML PO LQCR: 5.0000 mL | Freq: Two times a day (BID) | ORAL | Status: DC | PRN

## 2013-10-12 MED ORDER — TRIAMCINOLONE ACETONIDE 55 MCG/ACT NA AERO
2.0000 | INHALATION_SPRAY | Freq: Every day | NASAL | Status: DC
  Administered 2013-10-16 – 2013-10-18 (×3): 2 via NASAL
  Filled 2013-10-12: qty 16.5
  Filled 2013-10-12: qty 0

## 2013-10-12 MED ORDER — ONDANSETRON HCL 4 MG/2ML IJ SOLN: 4.0000 mg | Freq: Four times a day (QID) | INTRAMUSCULAR | Status: DC | PRN

## 2013-10-12 MED ORDER — DILTIAZEM HCL 25 MG/5ML IV SOLN
20.0000 mg | Freq: Once | INTRAVENOUS | Status: AC
  Administered 2013-10-12: 20 mg via INTRAVENOUS
  Filled 2013-10-12: qty 5

## 2013-10-12 MED ORDER — DILTIAZEM HCL 25 MG/5ML IV SOLN
20.0000 mg | Freq: Once | INTRAVENOUS | Status: AC
  Administered 2013-10-12: 20 mg via INTRAVENOUS

## 2013-10-12 NOTE — ED Notes (Signed)
Pt c/o increased sob, O2 sats at 90-92% on room air. Pt RR >30. Pt placed on 1.5 liters/min of oxygen with an nasal canula.

## 2013-10-12 NOTE — ED Notes (Signed)
HR 110's, changed rate to cardizem 72mL/hr. Will continue to monitor

## 2013-10-12 NOTE — ED Notes (Signed)
Pt ambulated to restroom. 

## 2013-10-12 NOTE — ED Provider Notes (Signed)
CSN: [id number]     Arrival date & time 10/12/13  1003 History   First MD Initiated Contact with Patient 10/12/13 1010     Chief Complaint  Patient presents with  . Chest Pain   (Consider location/radiation/quality/duration/timing/severity/associated sxs/prior Treatment) Patient is a 63 y.o. male presenting with palpitations. The history is provided by the patient. No language interpreter was used.  Palpitations Palpitations quality:  Fast Onset quality:  Sudden Duration:  3 days Timing:  Constant Progression:  Unchanged Chronicity:  New Context comment:  5 weeks of URI symptoms Relieved by:  Nothing Worsened by:  Nothing tried Ineffective treatments:  None tried Associated symptoms: chest pain, cough, malaise/fatigue, orthopnea and shortness of breath   Associated symptoms: no back pain, no diaphoresis, no dizziness, no hemoptysis, no lower extremity edema, no nausea, no near-syncope, no numbness, no syncope, no vomiting and no weakness   Chest pain:    Quality:  Dull (pleuritic)   Severity:  Mild   Duration:  3 days   Timing:  Intermittent   Progression:  Waxing and waning Cough:    Cough characteristics:  Productive   Sputum characteristics:  Clear   Severity:  Moderate   Duration:  5 weeks   Timing:  Constant   Progression:  Unchanged   Chronicity:  New Risk factors: heart disease   Risk factors: no hx of atrial fibrillation (denies) and no hx of PE   Risk factors comment:  HTN   Past Medical History  Diagnosis Date  . Hypertension   . Coronary artery disease    Past Surgical History  Procedure Laterality Date  . Hernia repair  1992 & 2011  . Cholecystectomy     Family History  Problem Relation Age of Onset  . Hyperlipidemia Mother   . Hypertension Mother    History  Substance Use Topics  . Smoking status: Current Every Day Smoker -- 0.50 packs/day  . Smokeless tobacco: Not on file  . Alcohol Use: Yes     Comment: 1-2 A DAY    Review of Systems   Constitutional: Positive for malaise/fatigue. Negative for fever, diaphoresis, activity change, appetite change and fatigue.  HENT: Negative for congestion, facial swelling, rhinorrhea and trouble swallowing.   Eyes: Negative for photophobia and pain.  Respiratory: Positive for cough and shortness of breath. Negative for hemoptysis and chest tightness.   Cardiovascular: Positive for chest pain, palpitations and orthopnea. Negative for leg swelling, syncope and near-syncope.  Gastrointestinal: Negative for nausea, vomiting, abdominal pain, diarrhea and constipation.  Endocrine: Negative for polydipsia and polyuria.  Genitourinary: Negative for dysuria, urgency, decreased urine volume and difficulty urinating.  Musculoskeletal: Negative for back pain and gait problem.  Skin: Negative for color change, rash and wound.  Allergic/Immunologic: Negative for immunocompromised state.  Neurological: Negative for dizziness, facial asymmetry, speech difficulty, weakness, numbness and headaches.  Psychiatric/Behavioral: Negative for confusion, decreased concentration and agitation.    Allergies  Review of patient's allergies indicates no known allergies.  Home Medications   Current Outpatient Rx  Name  Route  Sig  Dispense  Refill  . albuterol (PROVENTIL HFA;VENTOLIN HFA) 108 (90 BASE) MCG/ACT inhaler   Inhalation   Inhale 2 puffs into the lungs every 6 (six) hours as needed for wheezing or shortness of breath.         . ALPRAZolam (XANAX) 1 MG tablet   Oral   Take 1 mg by mouth See admin instructions. One tablet at lunch, then one tablet in the evening.  May take an extra tab in the evening as needed for anxiety.         Marland Kitchen aspirin 325 MG tablet   Oral   Take 325 mg by mouth daily.         . cyclobenzaprine (FLEXERIL) 5 MG tablet   Oral   Take 5 mg by mouth every evening.         . Pseudoeph-Doxylamine-DM-APAP (NYQUIL PO)   Oral   Take 2 tablets by mouth at bedtime as needed (for  cough).         . triamcinolone (NASACORT) 55 MCG/ACT AERO nasal inhaler   Each Nare   Place 2 sprays into both nostrils daily.          BP 121/99  Pulse 47  Temp(Src) 97.6 F (36.4 C) (Oral)  Resp 23  SpO2 98% Physical Exam  Constitutional: He is oriented to person, place, and time. He appears well-developed and well-nourished. No distress.  HENT:  Head: Normocephalic and atraumatic.  Mouth/Throat: No oropharyngeal exudate.  Eyes: Pupils are equal, round, and reactive to light.  Neck: Normal range of motion. Neck supple.  Cardiovascular: An irregularly irregular rhythm present. Tachycardia present.  Exam reveals no gallop and no friction rub.   No murmur heard. Pulmonary/Chest: Effort normal and breath sounds normal. No respiratory distress. He has no wheezes. He has no rales.  Abdominal: Soft. Bowel sounds are normal. He exhibits no distension and no mass. There is no tenderness. There is no rebound and no guarding.  Musculoskeletal: Normal range of motion. He exhibits no edema and no tenderness.  Neurological: He is alert and oriented to person, place, and time.  Skin: Skin is warm and dry.  Psychiatric: He has a normal mood and affect.    ED Course  Procedures (including critical care time) Labs Review Labs Reviewed  BASIC METABOLIC PANEL - Abnormal; Notable for the following:    Glucose, Bld 114 (*)    GFR calc non Af Amer 89 (*)    All other components within normal limits  PRO B NATRIURETIC PEPTIDE - Abnormal; Notable for the following:    Pro B Natriuretic peptide (BNP) 3172.0 (*)    All other components within normal limits  CBC WITH DIFFERENTIAL  TROPONIN I  POCT I-STAT TROPONIN I   Imaging Review Ct Angio Chest Pe W/cm &/or Wo Cm  10/12/2013   CLINICAL DATA:  Pleuritic chest pain, new onset AFib  EXAM: CT ANGIOGRAPHY CHEST WITH CONTRAST  TECHNIQUE: Multidetector CT imaging of the chest was performed using the standard protocol during bolus administration  of intravenous contrast. Multiplanar CT image reconstructions including MIPs were obtained to evaluate the vascular anatomy.  CONTRAST:  OMNIPAQUE IOHEXOL 350 MG/ML SOLN  COMPARISON:  None.  FINDINGS: There is adequate opacification of the pulmonary arteries. There is no pulmonary embolus. The main pulmonary artery, right main pulmonary artery and left main pulmonary arteries are normal in size. The heart size is normal. There is no pericardial effusion. There is coronary artery atherosclerosis in the LAD.  There are bilateral small pleural effusions. There is mild interstitial thickening bilaterally. There is bibasilar dependent atelectasis. There is no focal consolidation.  There is mild nonspecific right hilar lymphadenopathy measuring 14 mm in short axis. There is a 14 mm AP window lymph node.There are multiple other subcentimeter mediastinal lymph nodes.  There is no lytic or blastic osseous lesion. Mild degenerative disc disease throughout the thoracic spine. No compression fracture or static  listhesis.  Partially visualized 3 mm left nephrolithiasis.  Review of the MIP images confirms the above findings.  IMPRESSION: 1. No CT evidence of pulmonary embolus. 2. Bilateral pleural effusions and interstitial thickening concerning for mild pulmonary edema. 3. Coronary artery disease primarily involving the LAD. 4. Mild nonspecific mediastinal and right hilar lymphadenopathy.   Electronically Signed   By: Elige Ko   On: 10/12/2013 13:08   Dg Chest Port 1 View  10/12/2013   CLINICAL DATA:  63 year old male with shortness of breath and palpitations. Initial encounter.  EXAM: PORTABLE CHEST - 1 VIEW  COMPARISON:  02/23/2011 and earlier.  FINDINGS: Portable AP view at 1028 hrs. Stable lung volumes. Normal cardiac size and mediastinal contours. Visualized tracheal air column is within normal limits. No pneumothorax. No consolidation or definite pleural effusion. Increased bilateral basilar predominant  interstitial markings. No confluent pulmonary opacity.  IMPRESSION: Stable chest except for increased basilar predominant interstitial markings, could reflect increased interstitial changes since 2012. Viral / atypical respiratory infection also is a consideration.   Electronically Signed   By: Augusto Gamble M.D.   On: 10/12/2013 10:37    EKG Interpretation    Date/Time:  Wednesday October 12 2013 10:08:47 EST Ventricular Rate:  152 PR Interval:    QRS Duration: 98 QT Interval:  304 QTC Calculation: 483 R Axis:   125 Text Interpretation:  Atrial fibrillation Left posterior fascicular block ST depression, probably rate related Borderline prolonged QT interval Confirmed by DOCHERTY  MD, MEGAN ([id number]) on 10/12/2013 10:16:08 AM            MDM   1. Atrial fibrillation with rapid ventricular response   2. CHF (congestive heart failure)   3. Pleuritic chest pain    Pt is a 63 y.o. male with Pmhx as above who presents with about 5 weeks of URI symptoms not improved with prednisone, z-pack & amox, tussin, now w/ 3 days of palpitations, pleuritic chest pain.  On arrival, pt in afib w/ RVR, rate 140-160.  Denies hx of same.  Has rx for diltiazem which he has not taken for about 1 month.  Nml mentation, stable BP.  No LE pain/eema other than occasional chronic burning of anterior R thigh.  Pt given IVF bolus, 20mg  IV dilt, with improvement of HR to 80-90's.  CBC, BMP unremarkable.  BNP >3K.  Trop nml.  CXR inc interstitial markings.  CT PE study ordered to r/o PE given pleuritic CP and new onset afib which was negative for PE.  Rate improved for about 1 hr after 20mg  IV dilt, before returning to 120's-140's.  Second dose 20mg  IV dilt given and gtt started.  Cardiology consulted for admission.         Shanna Cisco, MD 10/12/13 2031

## 2013-10-12 NOTE — H&P (Signed)
History and Physical   Patient ID: Peter Mclean MRN: [id number], DOB/AGE: [birth year] 63 y.o. Date of Encounter: 10/12/2013  Primary Physician: Peter Hoit, MD Primary Cardiologist: Peter Mclean  Chief Complaint:  SOB, rapid atrial fib  HPI: Peter Mclean is a 63 y.o. male with a history of atrial arrhythmias including PACs, nonsustained atrial flutter, and atrial fibrillation. He was seen by Dr. Johney Mclean in 2010 prior to hernia surgery and was placed on Cardizem CD 180 mg daily and an aspirin.  Peter Mclean admits to being hard-headed. He states he has been off his medications for while, just decided to stop taking them.  Approximately 5 weeks ago, he developed an upper respiratory infection. This was evidenced by cough with clear/white sputum, malaise and generalized aching. He also had some dyspnea on exertion. He had no fevers. He did a Z-Pak and amoxicillin as well as 2 rounds of steroids and an inhaler. He has continued to cough and states that he wakes up coughing in the night. He also describes orthopnea. He denies lower extremity edema. He has never had chest pain.  4 days ago, he had sudden onset of severe dyspnea on exertion. Heme UVJ did not feel well but did not have any palpitations and no true awareness that his heart was irregular or out of rhythm. 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 She told him his heart rate was 170. Apparently his blood pressure was also elevated but he does not remember how high. He did not seek help as requested.   He had some episodes of abdominal bloating and bowel movement changes but denies diarrhea. This kept him up most of the night. This a.m., he finally came to the emergency room. In the emergency room, he was in rapid atrial fibrillation with a heart rate greater than 150. His chest x-ray and chest CT were concerning for CHF. He is significantly hypertensive as well. His blood pressure and heart rate have improved on IV  Cardizem but he is still very short of breath.   Past Medical History  Diagnosis Date  . Hypertension   . Atrial flutter     paroxysmal    Surgical History:  Past Surgical History  Procedure Laterality Date  . Hernia repair  1992 & 2011  . Cholecystectomy      I have reviewed the patient's current medications. Medication Sig  albuterol (PROVENTIL HFA;VENTOLIN HFA) 108 (90 BASE) MCG/ACT inhaler Inhale 2 puffs into the lungs every 6 (six) hours as needed for wheezing or shortness of breath.  ALPRAZolam (XANAX) 1 MG tablet Take 1 mg by mouth See admin instructions. One tablet at lunch, then one tablet in the evening. May take an extra tab in the evening as needed for anxiety.  aspirin 325 MG tablet Take 325 mg by mouth daily.  cyclobenzaprine (FLEXERIL) 5 MG tablet Take 5 mg by mouth every evening.  Pseudoeph-Doxylamine-DM-APAP (NYQUIL PO) Take 2 tablets by mouth at bedtime as needed (for cough).  triamcinolone (NASACORT) 55 MCG/ACT AERO nasal inhaler Place 2 sprays into both nostrils daily.   Scheduled Meds:  Continuous Infusions: . diltiazem (CARDIZEM) infusion 15 mg/hr (10/12/13 1538)   Allergies: No Known Allergies  History   Social History  . Marital Status: Married    Spouse Name: N/A    Number of Children: N/A  . Years of Education: N/A   Occupational History  . [employer] in [employer]  .     Marland Kitchen  Social History Main Topics  . Smoking status: Current Every Day Smoker -- 0.50 packs/day for 40 years  . Smokeless tobacco: Not on file     Comment: Has cut back to 2-3 cigarettes per day.  . Alcohol Use: Yes     Comment: 1-2 A DAY  . Drug Use: Not on file  . Sexual Activity: Not on file   Other Topics Concern  . Not on file   Social History Narrative   Lives in [place] with wife.     Family History  Problem Relation Age of Onset  . Hyperlipidemia Mother   . Hypertension Mother   . Heart attack Father    Family Status    Relation Status Death Age  . Mother Alive     Born [phone]  . Father Deceased 66    HEART ATTACK  . Brother Alive     No Hx CAD    Review of Systems:   Full 14-point review of systems otherwise negative except as noted above.  Physical Exam: Blood pressure 148/110, pulse 56, temperature 97.6 F (36.4 C), temperature source Oral, resp. rate 28, SpO2 97.00%. General: Well developed, well nourished,male in moderate respiratory distress. Head: Normocephalic, atraumatic, sclera non-icteric, no xanthomas, nares are without discharge. Dentition: Poor Neck: No carotid bruits. JVD elevated. No thyromegally Lungs: Good expansion bilaterally. without wheezes or rhonchi. Rales bilaterally Heart: Rapid and IRRegular rate and rhythm with S1 S2.  No S3 or S4.  No murmur, no rubs, or gallops appreciated. Abdomen: Soft, non-tender, non-distended with normoactive bowel sounds. No hepatomegaly. No rebound/guarding. No obvious abdominal masses. Msk:  Strength and tone appear normal for age. No joint deformities or effusions, no spine or costo-vertebral angle tenderness. Extremities: No clubbing or cyanosis. No edema.  Distal pedal pulses are 2+ in 4 extrem Neuro: Alert and oriented X 3. Moves all extremities spontaneously. No focal deficits noted. Psych:  Responds to questions appropriately with a normal affect. Skin: No rashes or lesions noted  Labs:   Lab Results  Component Value Date   WBC 10.2 10/12/2013   HGB 15.9 10/12/2013   HCT 46.4 10/12/2013   MCV 90.4 10/12/2013   PLT 180 10/12/2013     Recent Labs Lab 10/12/13 1025  NA 143  K 4.1  CL 106  CO2 23  BUN 16  CREATININE 0.92  CALCIUM 9.2  GLUCOSE 114*    Recent Labs  10/12/13 1024  TROPONINI <0.30    Recent Labs  10/12/13 1030  TROPIPOC 0.02   Pro B Natriuretic peptide (BNP)  Date/Time Value Range Status  10/12/2013 10:24 AM 3172.0* 0 - 125 pg/mL Final  02/23/2011 10:32 PM 48.7  0 - 125 pg/mL Final   Lab Results   Component Value Date   DDIMER 0.30 02/23/2011    Radiology/Studies: Ct Angio Chest Pe W/cm &/or Wo Cm 10/12/2013   CLINICAL DATA:  Pleuritic chest pain, new onset AFib  EXAM: CT ANGIOGRAPHY CHEST WITH CONTRAST  TECHNIQUE: Multidetector CT imaging of the chest was performed using the standard protocol during bolus administration of intravenous contrast. Multiplanar CT image reconstructions including MIPs were obtained to evaluate the vascular anatomy.  CONTRAST:  OMNIPAQUE IOHEXOL 350 MG/ML SOLN  COMPARISON:  None.  FINDINGS: There is adequate opacification of the pulmonary arteries. There is no pulmonary embolus. The main pulmonary artery, right main pulmonary artery and left main pulmonary arteries are normal in size. The heart size is normal. There is no pericardial effusion. There is  coronary artery atherosclerosis in the LAD.  There are bilateral small pleural effusions. There is mild interstitial thickening bilaterally. There is bibasilar dependent atelectasis. There is no focal consolidation.  There is mild nonspecific right hilar lymphadenopathy measuring 14 mm in short axis. There is a 14 mm AP window lymph node.There are multiple other subcentimeter mediastinal lymph nodes.  There is no lytic or blastic osseous lesion. Mild degenerative disc disease throughout the thoracic spine. No compression fracture or static listhesis.  Partially visualized 3 mm left nephrolithiasis.  Review of the MIP images confirms the above findings.  IMPRESSION: 1. No CT evidence of pulmonary embolus. 2. Bilateral pleural effusions and interstitial thickening concerning for mild pulmonary edema. 3. Coronary artery disease primarily involving the LAD. 4. Mild nonspecific mediastinal and right hilar lymphadenopathy.   Electronically Signed   By: Elige KoHetal  Patel   On: 10/12/2013 13:08   Dg Chest Port 1 View 10/12/2013   CLINICAL DATA:  63 year old male with shortness of breath and palpitations. Initial encounter.  EXAM:  PORTABLE CHEST - 1 VIEW  COMPARISON:  02/23/2011 and earlier.  FINDINGS: Portable AP view at 1028 hrs. Stable lung volumes. Normal cardiac size and mediastinal contours. Visualized tracheal air column is within normal limits. No pneumothorax. No consolidation or definite pleural effusion. Increased bilateral basilar predominant interstitial markings. No confluent pulmonary opacity.  IMPRESSION: Stable chest except for increased basilar predominant interstitial markings, could reflect increased interstitial changes since 2012. Viral / atypical respiratory infection also is a consideration.   Electronically Signed   By: Augusto GambleLee  Hall M.D.   On: 10/12/2013 10:37   Echo: 05/25/2009 Study Conclusions Left ventricle: The cavity size was normal. Wall thickness was normal. Systolic function was normal. The estimated ejection fraction was in the range of 55% to 60%. Atrial size is at the upper limits of normal.  ECG: 10/12/2013 Atrial fibrillation, rapid ventricular response, rate 152  ASSESSMENT AND PLAN:  Principal Problem:   CHF (congestive heart failure) - diureses, ck echo, if EF decreased, will need cath. Follow I/O, symptoms, weights. Cycle enzymes, ck A1c  Active Problems:   ANXIETY - continue home Rx    Atrial fibrillation with rapid ventricular response - IV Cardizem and titrate for rate control. Ck TSH    HTN (hypertension) - believe BP will tolerate Rx.     Anticoagulation - Eliquis  Signed, Theodore DemarkRhonda Barrett, PA-C 10/12/2013 4:21 PM Beeper [phone]    I have seen and examined the patient along with Theodore Demarkhonda Barrett, PA-C.  I have reviewed the chart, notes and new data.  I agree with PA's note.  Key new complaints: Continues to have dyspnea at rest and orthopnea but feels better than when he was admitted Key examination changes: Elevated jugular venous pulsations 8-10 cm above the sternal angle; bibasilar moist rales Key new findings / data: CT of the chest shows mild dependent  congestion of the lungs, Pro BNP elevated at 3172 (baseline in June 2012 was only 49), normal cardiac enzymes. Note prominent calcification of the proximal LAD artery by CT of the chest. No cardiomegaly  PLAN: Rate control with intravenous diltiazem Anticoagulation with apixaban Diuresis Echocardiography Plan for TEE guided cardioversion on Friday, if his breathing has improved so that he can tolerate sedation. By then he should have received at least 4 doses of anticoagulant. Strongly consider for coronary angiography if there is any evidence of left ventricular dysfunction. Otherwise risk stratify with a nuclear perfusion study as an outpatient. Smoking cessation discussed  Rachelle HoraMihai  Ernestina Joe, MD, [facility] [phone] 10/12/2013, 5:27 PM

## 2013-10-12 NOTE — ED Notes (Signed)
Pt undressed, in gown, on monitor, bp cuff and continuous pulse oximtry, family at bedside, MD at bedside

## 2013-10-12 NOTE — ED Notes (Addendum)
Changed rate of cardizem to 49mL/hr per order from Theodore Demark, NP.

## 2013-10-12 NOTE — ED Notes (Signed)
Ordered pt dinner tray. Brought pt [place] sandwich and drink

## 2013-10-12 NOTE — ED Notes (Signed)
Pt's HR now 90's Afib. after cardizem.

## 2013-10-12 NOTE — Progress Notes (Signed)
ANTICOAGULATION CONSULT NOTE - Initial Consult  Pharmacy Consult for Apixaban Indication: atrial fibrillation  No Known Allergies    Vital Signs: Temp: 97.6 F (36.4 C) (01/28 1012) Temp src: Oral (01/28 1012) BP: 137/97 mmHg (01/28 1630) Pulse Rate: 102 (01/28 1630)  Labs:  Recent Labs  10/12/13 1024 10/12/13 1025  HGB  --  15.9  HCT  --  46.4  PLT  --  180  CREATININE  --  0.92  TROPONINI <0.30  --     The CrCl is unknown because both a height and weight (above a minimum accepted value) are required for this calculation.   Medical History: Past Medical History  Diagnosis Date  . Hypertension   . Atrial flutter     paroxysmal    Medications:  See home med list  Assessment: 63 year old man presented to the ED with SOB, found to be in AFib.  Apixaban to start. Goal of Therapy:  Therapeutic anticoagulation   Plan:  Apixaban 5mg  PO BID  Mickeal Skinner 10/12/2013,4:57 PM

## 2013-10-12 NOTE — ED Notes (Signed)
Pt reports sob has gotten better but is feeling anxious and usually takes xanax at home. Requesting some anti-anxiety medicine.

## 2013-10-12 NOTE — ED Notes (Signed)
Pt maintaining HR 80-90's on cardizem 62mL/hr

## 2013-10-12 NOTE — ED Notes (Addendum)
Palpitations started on Sunday diarrhea and h/a and has hx of cold x 5 weeks has been on multiple meds for cold is suppose to be on meds for heart rate but has not taken it

## 2013-10-12 NOTE — ED Notes (Signed)
Patient transported to CT 

## 2013-10-13 ENCOUNTER — Encounter (HOSPITAL_COMMUNITY)
Admission: EM | Disposition: A | Discharge status: Home / Self Care | Payer: Self-pay | Source: Home / Self Care | Attending: Cardiovascular Disease

## 2013-10-13 ENCOUNTER — Encounter (HOSPITAL_COMMUNITY): Payer: Self-pay | Admitting: Physician Assistant

## 2013-10-13 DIAGNOSIS — E785 Hyperlipidemia, unspecified: Secondary | ICD-10-CM | POA: Diagnosis present

## 2013-10-13 DIAGNOSIS — I517 Cardiomegaly: Secondary | ICD-10-CM

## 2013-10-13 DIAGNOSIS — I428 Other cardiomyopathies: Secondary | ICD-10-CM

## 2013-10-13 HISTORY — PX: LEFT AND RIGHT HEART CATHETERIZATION WITH CORONARY ANGIOGRAM: SHX5449

## 2013-10-13 LAB — COMPREHENSIVE METABOLIC PANEL
ALT: 24 U/L (ref 0–53)
AST: 19 U/L (ref 0–37)
Albumin: 3.4 g/dL — ABNORMAL LOW (ref 3.5–5.2)
Alkaline Phosphatase: 87 U/L (ref 39–117)
BUN: 14 mg/dL (ref 6–23)
CO2: 23 mEq/L (ref 19–32)
Calcium: 9.1 mg/dL (ref 8.4–10.5)
Chloride: 103 mEq/L (ref 96–112)
Creatinine, Ser: 1.03 mg/dL (ref 0.50–1.35)
GFR calc Af Amer: 88 mL/min — ABNORMAL LOW (ref >90)
GFR calc non Af Amer: 76 mL/min — ABNORMAL LOW (ref >90)
Glucose, Bld: 91 mg/dL (ref 70–99)
Potassium: 3.7 mEq/L (ref 3.7–5.3)
Sodium: 142 mEq/L (ref 137–147)
Total Bilirubin: 2.3 mg/dL — ABNORMAL HIGH (ref 0.3–1.2)
Total Protein: 6.3 g/dL (ref 6.0–8.3)

## 2013-10-13 LAB — POCT I-STAT 3, VENOUS BLOOD GAS (G3P V)
Acid-base deficit: 1 mmol/L (ref 0.0–2.0)
Bicarbonate: 25 mEq/L — ABNORMAL HIGH (ref 20.0–24.0)
O2 Saturation: 55 %
TCO2: 26 mmol/L (ref 0–100)
pCO2, Ven: 46.3 mmHg (ref 45.0–50.0)
pH, Ven: 7.341 — ABNORMAL HIGH (ref 7.250–7.300)
pO2, Ven: 31 mmHg (ref 30.0–45.0)

## 2013-10-13 LAB — POCT I-STAT 3, ART BLOOD GAS (G3+)
Acid-Base Excess: 2 mmol/L (ref 0.0–2.0)
Bicarbonate: 25.5 mEq/L — ABNORMAL HIGH (ref 20.0–24.0)
O2 Saturation: 91 %
TCO2: 27 mmol/L (ref 0–100)
pCO2 arterial: 37.6 mmHg (ref 35.0–45.0)
pH, Arterial: 7.44 (ref 7.350–7.450)
pO2, Arterial: 58 mmHg — ABNORMAL LOW (ref 80.0–100.0)

## 2013-10-13 LAB — LIPID PANEL
Cholesterol: 162 mg/dL (ref 0–200)
HDL: 34 mg/dL — ABNORMAL LOW (ref >39)
LDL Cholesterol: 113 mg/dL — ABNORMAL HIGH (ref 0–99)
Total CHOL/HDL Ratio: 4.8 RATIO
Triglycerides: 74 mg/dL (ref <150)
VLDL: 15 mg/dL (ref 0–40)

## 2013-10-13 LAB — TSH: TSH: 1.812 u[IU]/mL (ref 0.350–4.500)

## 2013-10-13 LAB — TROPONIN I
Troponin I: <0.3 ng/mL (ref <0.30)
Troponin I: <0.3 ng/mL (ref <0.30)

## 2013-10-13 LAB — HEMOGLOBIN A1C
Hgb A1c MFr Bld: 5.8 % — ABNORMAL HIGH (ref <5.7)
Mean Plasma Glucose: 120 mg/dL — ABNORMAL HIGH (ref <117)

## 2013-10-13 SURGERY — LEFT AND RIGHT HEART CATHETERIZATION WITH CORONARY ANGIOGRAM
Anesthesia: LOCAL

## 2013-10-13 MED ORDER — LIVING WELL WITH DIABETES BOOK
Freq: Once | Status: DC
  Filled 2013-10-13: qty 1

## 2013-10-13 MED ORDER — SODIUM CHLORIDE 0.9 % IJ SOLN: 3.0000 mL | INTRAMUSCULAR | Status: DC | PRN

## 2013-10-13 MED ORDER — CARVEDILOL 3.125 MG PO TABS
3.1250 mg | ORAL_TABLET | Freq: Two times a day (BID) | ORAL | Status: DC
  Administered 2013-10-13 – 2013-10-14 (×3): 3.125 mg via ORAL
  Filled 2013-10-13 (×7): qty 1

## 2013-10-13 MED ORDER — ASPIRIN 81 MG PO CHEW: 81.0000 mg | CHEWABLE_TABLET | ORAL | Status: DC

## 2013-10-13 MED ORDER — OFF THE BEAT BOOK
Freq: Once | Status: AC
  Administered 2013-10-13: 23:00:00
  Filled 2013-10-13: qty 1

## 2013-10-13 MED ORDER — SODIUM CHLORIDE 0.9 % IV SOLN: 250.0000 mL | INTRAVENOUS | Status: DC | PRN

## 2013-10-13 MED ORDER — HEPARIN SODIUM (PORCINE) 1000 UNIT/ML IJ SOLN
INTRAMUSCULAR | Status: AC
  Filled 2013-10-13: qty 1

## 2013-10-13 MED ORDER — HEPARIN (PORCINE) IN NACL 2-0.9 UNIT/ML-% IJ SOLN
INTRAMUSCULAR | Status: AC
  Filled 2013-10-13: qty 1000

## 2013-10-13 MED ORDER — VERAPAMIL HCL 2.5 MG/ML IV SOLN
INTRAVENOUS | Status: AC
  Filled 2013-10-13: qty 2

## 2013-10-13 MED ORDER — MIDAZOLAM HCL 2 MG/2ML IJ SOLN
INTRAMUSCULAR | Status: AC
  Filled 2013-10-13: qty 2

## 2013-10-13 MED ORDER — SODIUM CHLORIDE 0.9 % IV SOLN: INTRAVENOUS | Status: AC

## 2013-10-13 MED ORDER — FENTANYL CITRATE 0.05 MG/ML IJ SOLN
INTRAMUSCULAR | Status: AC
  Filled 2013-10-13: qty 2

## 2013-10-13 MED ORDER — NITROGLYCERIN 0.2 MG/ML ON CALL CATH LAB
INTRAVENOUS | Status: AC
  Filled 2013-10-13: qty 1

## 2013-10-13 MED ORDER — MORPHINE SULFATE 2 MG/ML IJ SOLN
2.0000 mg | INTRAMUSCULAR | Status: DC | PRN
  Administered 2013-10-17: 2 mg via INTRAVENOUS
  Filled 2013-10-13: qty 1

## 2013-10-13 MED ORDER — HYDROCODONE-ACETAMINOPHEN 5-325 MG PO TABS
1.0000 | ORAL_TABLET | ORAL | Status: DC | PRN
  Administered 2013-10-13 – 2013-10-19 (×22): 2 via ORAL
  Filled 2013-10-13: qty 1
  Filled 2013-10-13 (×15): qty 2
  Filled 2013-10-13: qty 1
  Filled 2013-10-13 (×6): qty 2

## 2013-10-13 MED ORDER — SODIUM CHLORIDE 0.9 % IJ SOLN: 3.0000 mL | Freq: Two times a day (BID) | INTRAMUSCULAR | Status: DC

## 2013-10-13 MED ORDER — LIDOCAINE HCL (PF) 1 % IJ SOLN
INTRAMUSCULAR | Status: AC
  Filled 2013-10-13: qty 30

## 2013-10-13 MED ORDER — SODIUM CHLORIDE 0.9 % IV SOLN
INTRAVENOUS | Status: DC
  Administered 2013-10-13: 20 mL/h via INTRAVENOUS

## 2013-10-13 MED ORDER — SODIUM CHLORIDE 0.9 % IJ SOLN
3.0000 mL | Freq: Two times a day (BID) | INTRAMUSCULAR | Status: DC
  Administered 2013-10-14 – 2013-10-18 (×8): 3 mL via INTRAVENOUS

## 2013-10-13 MED ORDER — DILTIAZEM HCL ER COATED BEADS 180 MG PO CP24
180.0000 mg | ORAL_CAPSULE | Freq: Every day | ORAL | Status: DC
  Administered 2013-10-13 – 2013-10-14 (×2): 180 mg via ORAL
  Filled 2013-10-13 (×3): qty 1

## 2013-10-13 NOTE — Progress Notes (Signed)
Utilization review completed. Hezakiah Champeau, RN, BSN. 

## 2013-10-13 NOTE — Progress Notes (Addendum)
Patient Name: Peter Mclean Date of Encounter: 10/13/2013  Principal Problem:   CHF (congestive heart failure) Active Problems:   Atrial fibrillation with rapid ventricular response   ANXIETY   HTN (hypertension)   Dyslipidemia (high LDL; low HDL)    SUBJECTIVE: Breathing much better today, not aware of irreg HR. No chest pain. Marland Kitchen Has migraine. Drinks lg tea and coffee x 1 daily, occasional Goody powders and Excedrin. Took 2-Nyquil capsules last 2 nights before admission. 2-D echo has now been done. There is significant left ventricular dysfunction. Prior notes outline the fact that we will try to proceed with catheterization if we can if there is left ventricular dysfunction.  OBJECTIVE Filed Vitals:   10/13/13 0400 10/13/13 0424 10/13/13 0600 10/13/13 0756  BP: 112/75 112/75 119/77 133/75  Pulse: 66  77 86  Temp:  97.6 F (36.4 C)  97.7 F (36.5 C)  TempSrc:  Oral  Oral  Resp: 14  24 23   Height:      Weight:  138 lb 3.7 oz (62.7 kg)    SpO2: 97% 97% 96% 97%    Intake/Output Summary (Last 24 hours) at 10/13/13 0828 Last data filed at 10/13/13 0757  Gross per 24 hour  Intake  87.17 ml  Output   1050 ml  Net -962.83 ml   Filed Weights   10/12/13 1920 10/13/13 0424  Weight: 138 lb 3.7 oz (62.7 kg) 138 lb 3.7 oz (62.7 kg)    PHYSICAL EXAM General: Well developed, well nourished, male in no acute distress. Head: Normocephalic, atraumatic.  Neck: Supple without bruits, JVD at 10 cm. Lungs:  Resp regular and unlabored, bilateral crackles, slight wheeze.Marland Kitchen Heart: Irregular R&R, S1, S2, ? S3, no S4, or murmur; no rub. Abdomen: Soft, non-tender, non-distended, BS + x 4.  Extremities: No clubbing, cyanosis, no edema.  Neuro: Alert and oriented X 3. Moves all extremities spontaneously. Psych: Normal affect.  LABS: CBC:  Recent Labs  10/12/13 1025  WBC 10.2  NEUTROABS 6.5  HGB 15.9  HCT 46.4  MCV 90.4  PLT 180   INR:  Recent Labs  10/12/13 2150  INR  1.10   Basic Metabolic Panel:  Recent Labs  40/98/11 1025 10/13/13 0317  NA 143 142  K 4.1 3.7  CL 106 103  CO2 23 23  GLUCOSE 114* 91  BUN 16 14  CREATININE 0.92 1.03  CALCIUM 9.2 9.1   Liver Function Tests:  Recent Labs  10/13/13 0317  AST 19  ALT 24  ALKPHOS 87  BILITOT 2.3*  PROT 6.3  ALBUMIN 3.4*   Cardiac Enzymes:  Recent Labs  10/12/13 1024 10/12/13 2150 10/13/13 0110  TROPONINI <0.30 <0.30 <0.30    Recent Labs  10/12/13 1030  TROPIPOC 0.02   BNP: Pro B Natriuretic peptide (BNP)  Date/Time Value Range Status  10/12/2013 10:24 AM 3172.0* 0 - 125 pg/mL Final  02/23/2011 10:32 PM 48.7  0 - 125 pg/mL Final   Hemoglobin A1C:  Recent Labs  10/12/13 2150  HGBA1C 5.8*   Fasting Lipid Panel:  Recent Labs  10/13/13 0317  CHOL 162  HDL 34*  LDLCALC 113*  TRIG 74  CHOLHDL 4.8   Thyroid Function Tests:  Recent Labs  10/12/13 2150  TSH 1.812   TELE:  Atrial fib, RVR at times      Radiology/Studies: Ct Angio Chest Pe W/cm &/or Wo Cm 10/12/2013   CLINICAL DATA:  Pleuritic chest pain, new onset AFib  EXAM:  CT ANGIOGRAPHY CHEST WITH CONTRAST  TECHNIQUE: Multidetector CT imaging of the chest was performed using the standard protocol during bolus administration of intravenous contrast. Multiplanar CT image reconstructions including MIPs were obtained to evaluate the vascular anatomy.  CONTRAST:  OMNIPAQUE IOHEXOL 350 MG/ML SOLN  COMPARISON:  None.  FINDINGS: There is adequate opacification of the pulmonary arteries. There is no pulmonary embolus. The main pulmonary artery, right main pulmonary artery and left main pulmonary arteries are normal in size. The heart size is normal. There is no pericardial effusion. There is coronary artery atherosclerosis in the LAD.  There are bilateral small pleural effusions. There is mild interstitial thickening bilaterally. There is bibasilar dependent atelectasis. There is no focal consolidation.  There is mild  nonspecific right hilar lymphadenopathy measuring 14 mm in short axis. There is a 14 mm AP window lymph node.There are multiple other subcentimeter mediastinal lymph nodes.  There is no lytic or blastic osseous lesion. Mild degenerative disc disease throughout the thoracic spine. No compression fracture or static listhesis.  Partially visualized 3 mm left nephrolithiasis.  Review of the MIP images confirms the above findings.  IMPRESSION: 1. No CT evidence of pulmonary embolus. 2. Bilateral pleural effusions and interstitial thickening concerning for mild pulmonary edema. 3. Coronary artery disease primarily involving the LAD. 4. Mild nonspecific mediastinal and right hilar lymphadenopathy.   Electronically Signed   By: Elige Ko   On: 10/12/2013 13:08   Dg Chest Port 1 View 10/12/2013   CLINICAL DATA:  63 year old male with shortness of breath and palpitations. Initial encounter.  EXAM: PORTABLE CHEST - 1 VIEW  COMPARISON:  02/23/2011 and earlier.  FINDINGS: Portable AP view at 1028 hrs. Stable lung volumes. Normal cardiac size and mediastinal contours. Visualized tracheal air column is within normal limits. No pneumothorax. No consolidation or definite pleural effusion. Increased bilateral basilar predominant interstitial markings. No confluent pulmonary opacity.  IMPRESSION: Stable chest except for increased basilar predominant interstitial markings, could reflect increased interstitial changes since 2012. Viral / atypical respiratory infection also is a consideration.   Electronically Signed   By: Augusto Gamble M.D.   On: 10/12/2013 10:37     Current Medications:  . apixaban  5 mg Oral BID  . apixaban  5 mg Oral BID  . aspirin EC  81 mg Oral Daily  . cyclobenzaprine  5 mg Oral QPM  . furosemide  40 mg Intravenous BID  . sodium chloride  3 mL Intravenous Q12H  . triamcinolone  2 spray Each Nare Daily   . diltiazem (CARDIZEM) infusion 5 mg/hr (10/13/13 0326)    ASSESSMENT AND PLAN: Principal  Problem:   CHF (congestive heart failure) - greatly improved with diuresis. Continue today, re-assess in am, echo ordered. Will add Coreg 3.125 bid.  Active Problems:   Atrial fibrillation with rapid ventricular response - change back to previous Cardizem CD 180 mg. D/c gtt once PO given.    ANXIETY - Xanax at home dose    HTN (hypertension) - good control on current Rx.    Dyslipidemia (high LDL; low HDL) - diet PTA was POOR. Encourage modification, add statin if EF low.  Plan - f/u echo,  I have now followed up the echo. There is significant left ventricular dysfunction. I will be finalizing plans for cardiac catheterization with the patient. Signed, Theodore Demark , PA-C 8:28 AM 10/13/2013 Patient seen and examined. I agree with the assessment and plan as detailed above. See also my additional thoughts below.  I have added my input to the note above. We will plan to proceed with cardiac catheterization.  I discussed this fully with the patient.. After catheterization, decision can be made about possibly proceeding with TEE cardioversion tomorrow.  Willa RoughJeffrey Landis Cassaro, MD, [facility] 10/13/2013 10:54 AM

## 2013-10-13 NOTE — H&P (View-Only) (Signed)
Patient Name: Peter Mclean Date of Encounter: 10/13/2013  Principal Problem:   CHF (congestive heart failure) Active Problems:   Atrial fibrillation with rapid ventricular response   ANXIETY   HTN (hypertension)   Dyslipidemia (high LDL; low HDL)    SUBJECTIVE: Breathing much better today, not aware of irreg HR. No chest pain. Marland Kitchen Has migraine. Drinks lg tea and coffee x 1 daily, occasional Goody powders and Excedrin. Took 2-Nyquil capsules last 2 nights before admission. 2-D echo has now been done. There is significant left ventricular dysfunction. Prior notes outline the fact that we will try to proceed with catheterization if we can if there is left ventricular dysfunction.  OBJECTIVE Filed Vitals:   10/13/13 0400 10/13/13 0424 10/13/13 0600 10/13/13 0756  BP: 112/75 112/75 119/77 133/75  Pulse: 66  77 86  Temp:  97.6 F (36.4 C)  97.7 F (36.5 C)  TempSrc:  Oral  Oral  Resp: 14  24 23   Height:      Weight:  138 lb 3.7 oz (62.7 kg)    SpO2: 97% 97% 96% 97%    Intake/Output Summary (Last 24 hours) at 10/13/13 0828 Last data filed at 10/13/13 0757  Gross per 24 hour  Intake  87.17 ml  Output   1050 ml  Net -962.83 ml   Filed Weights   10/12/13 1920 10/13/13 0424  Weight: 138 lb 3.7 oz (62.7 kg) 138 lb 3.7 oz (62.7 kg)    PHYSICAL EXAM General: Well developed, well nourished, male in no acute distress. Head: Normocephalic, atraumatic.  Neck: Supple without bruits, JVD at 10 cm. Lungs:  Resp regular and unlabored, bilateral crackles, slight wheeze.Marland Kitchen Heart: Irregular R&R, S1, S2, ? S3, no S4, or murmur; no rub. Abdomen: Soft, non-tender, non-distended, BS + x 4.  Extremities: No clubbing, cyanosis, no edema.  Neuro: Alert and oriented X 3. Moves all extremities spontaneously. Psych: Normal affect.  LABS: CBC:  Recent Labs  10/12/13 1025  WBC 10.2  NEUTROABS 6.5  HGB 15.9  HCT 46.4  MCV 90.4  PLT 180   INR:  Recent Labs  10/12/13 2150  INR  1.10   Basic Metabolic Panel:  Recent Labs  40/98/11 1025 10/13/13 0317  NA 143 142  K 4.1 3.7  CL 106 103  CO2 23 23  GLUCOSE 114* 91  BUN 16 14  CREATININE 0.92 1.03  CALCIUM 9.2 9.1   Liver Function Tests:  Recent Labs  10/13/13 0317  AST 19  ALT 24  ALKPHOS 87  BILITOT 2.3*  PROT 6.3  ALBUMIN 3.4*   Cardiac Enzymes:  Recent Labs  10/12/13 1024 10/12/13 2150 10/13/13 0110  TROPONINI <0.30 <0.30 <0.30    Recent Labs  10/12/13 1030  TROPIPOC 0.02   BNP: Pro B Natriuretic peptide (BNP)  Date/Time Value Range Status  10/12/2013 10:24 AM 3172.0* 0 - 125 pg/mL Final  02/23/2011 10:32 PM 48.7  0 - 125 pg/mL Final   Hemoglobin A1C:  Recent Labs  10/12/13 2150  HGBA1C 5.8*   Fasting Lipid Panel:  Recent Labs  10/13/13 0317  CHOL 162  HDL 34*  LDLCALC 113*  TRIG 74  CHOLHDL 4.8   Thyroid Function Tests:  Recent Labs  10/12/13 2150  TSH 1.812   TELE:  Atrial fib, RVR at times      Radiology/Studies: Ct Angio Chest Pe W/cm &/or Wo Cm 10/12/2013   CLINICAL DATA:  Pleuritic chest pain, new onset AFib  EXAM:  CT ANGIOGRAPHY CHEST WITH CONTRAST  TECHNIQUE: Multidetector CT imaging of the chest was performed using the standard protocol during bolus administration of intravenous contrast. Multiplanar CT image reconstructions including MIPs were obtained to evaluate the vascular anatomy.  CONTRAST:  OMNIPAQUE IOHEXOL 350 MG/ML SOLN  COMPARISON:  None.  FINDINGS: There is adequate opacification of the pulmonary arteries. There is no pulmonary embolus. The main pulmonary artery, right main pulmonary artery and left main pulmonary arteries are normal in size. The heart size is normal. There is no pericardial effusion. There is coronary artery atherosclerosis in the LAD.  There are bilateral small pleural effusions. There is mild interstitial thickening bilaterally. There is bibasilar dependent atelectasis. There is no focal consolidation.  There is mild  nonspecific right hilar lymphadenopathy measuring 14 mm in short axis. There is a 14 mm AP window lymph node.There are multiple other subcentimeter mediastinal lymph nodes.  There is no lytic or blastic osseous lesion. Mild degenerative disc disease throughout the thoracic spine. No compression fracture or static listhesis.  Partially visualized 3 mm left nephrolithiasis.  Review of the MIP images confirms the above findings.  IMPRESSION: 1. No CT evidence of pulmonary embolus. 2. Bilateral pleural effusions and interstitial thickening concerning for mild pulmonary edema. 3. Coronary artery disease primarily involving the LAD. 4. Mild nonspecific mediastinal and right hilar lymphadenopathy.   Electronically Signed   By: Elige Ko   On: 10/12/2013 13:08   Dg Chest Port 1 View 10/12/2013   CLINICAL DATA:  63 year old male with shortness of breath and palpitations. Initial encounter.  EXAM: PORTABLE CHEST - 1 VIEW  COMPARISON:  02/23/2011 and earlier.  FINDINGS: Portable AP view at 1028 hrs. Stable lung volumes. Normal cardiac size and mediastinal contours. Visualized tracheal air column is within normal limits. No pneumothorax. No consolidation or definite pleural effusion. Increased bilateral basilar predominant interstitial markings. No confluent pulmonary opacity.  IMPRESSION: Stable chest except for increased basilar predominant interstitial markings, could reflect increased interstitial changes since 2012. Viral / atypical respiratory infection also is a consideration.   Electronically Signed   By: Augusto Gamble M.D.   On: 10/12/2013 10:37     Current Medications:  . apixaban  5 mg Oral BID  . apixaban  5 mg Oral BID  . aspirin EC  81 mg Oral Daily  . cyclobenzaprine  5 mg Oral QPM  . furosemide  40 mg Intravenous BID  . sodium chloride  3 mL Intravenous Q12H  . triamcinolone  2 spray Each Nare Daily   . diltiazem (CARDIZEM) infusion 5 mg/hr (10/13/13 0326)    ASSESSMENT AND PLAN: Principal  Problem:   CHF (congestive heart failure) - greatly improved with diuresis. Continue today, re-assess in am, echo ordered. Will add Coreg 3.125 bid.  Active Problems:   Atrial fibrillation with rapid ventricular response - change back to previous Cardizem CD 180 mg. D/c gtt once PO given.    ANXIETY - Xanax at home dose    HTN (hypertension) - good control on current Rx.    Dyslipidemia (high LDL; low HDL) - diet PTA was POOR. Encourage modification, add statin if EF low.  Plan - f/u echo,  I have now followed up the echo. There is significant left ventricular dysfunction. I will be finalizing plans for cardiac catheterization with the patient. Signed, Theodore Demark , PA-C 8:28 AM 10/13/2013 Patient seen and examined. I agree with the assessment and plan as detailed above. See also my additional thoughts below.  I have added my input to the note above. We will plan to proceed with cardiac catheterization.  I discussed this fully with the patient.. After catheterization, decision can be made about possibly proceeding with TEE cardioversion tomorrow.  Willa RoughJeffrey Madalen Gavin, MD, [facility] CenterFACC 10/13/2013 10:54 AM

## 2013-10-13 NOTE — CV Procedure (Signed)
CARDIAC CATHETERIZATION REPORT  NAME:  Peter Mclean   MRN: [id number] DOB:  06/12/1951   ADMIT DATE: 10/12/2013 Procedure Date: 10/13/2013  INTERVENTIONAL CARDIOLOGIST: Peter Mclean, M.D., MS PRIMARY CARE PROVIDER: Woody Seller, MD PRIMARY CARDIOLOGIST: Peter Na MD  PATIENT:  Peter Mclean is a 63 y.o. male with a history of atrial arrhythmias with PACs, nonsustained atrial flutter as well as atrial fibrillation. He is a patient of Dr. Rayann Mclean. He admits to being off his medications for a while, just not taking them. He also has had a five-week history of upper respiratory tract infection. He was admitted on January 28 with a four-day history of significant exertional dyspnea with PND and orthopnea. On admission his atrial fibrillation rate was in the 170s. His blood pressure is also elevated. He has been diuresed and rate controlled, his echocardiogram however showed a significantly reduced ejection fraction of roughly 15%. He was seen today by Dr. Ron Mclean on rounds, who is referred for right and left heart catheterization to rule out ischemic cardiac myopathy.  PRE-OPERATIVE DIAGNOSIS:    Severe Cardiomyopathy, etiology unknown  Atrial fibrillation/flutter with intermittent rate control.  PROCEDURES PERFORMED:    Right and Left Heart Catheterization with Native Coronary Angiography.  Ultrasound-guided access Of Right Common Femoral Vein  PROCEDURE:Consent:  Risks of procedure as well as the alternatives and risks of each were explained to the (patient/caregiver).  Consent for procedure obtained. Consent for signed by MD and patient with RN witness -- placed on chart.   PROCEDURE: The patient was brought to the [facility]nd [facility] in the fasting state and prepped and draped in the usual sterile fashion for Right groin or radial access. A modified Allen's test with plethysmography was performed, revealing excellent Ulnar artery collateral flow.   Sterile technique was used including antiseptics, cap, gloves, gown, hand hygiene, mask and sheet.  Skin prep: Chlorhexidine.  Time Out: Verified patient identification, verified procedure, site/side was marked, verified correct patient position, special equipment/implants available, medications/allergies/relevent history reviewed, required imaging and test results available.  Performed  Access:   Right Radial Artery  6 Fr Sheath --  Seldinger technique (Angiocath Micropuncture Kit)  IA Radial Cocktail, IV Heparin   Right Common Femoral Vein: 7 Fr Sheath - direct ultrasound-guided Seldinger Technique.  Right Heart Catheterization: 7Fr Gordy Councilman catheter advanced under fluoroscopy with balloon inflated to the RA, RV, then PCWP-PA for hemodynamic measurement.  Simultaneous FA & PA blood gases checked for SaO2% to calculate FICK CO/CI  Thermodilution Injections performed to calculate CO/CI  Simultaneous PCWP/LV & RV/LV pressures monitored with Angled Pigtail in LV.  Catheter removed completely out of the body with balloon deflated.  Left Heart Catheterization: 5 Fr Catheters advanced or exchanged over a long exchange J-wire; LV Hemodynamics (LV Gram): Initially TIG 4.0 followed by JR 4 Left Coronary Artery Cineangiography: TIG 4.0 Catheter  Right Coronary Artery Cineangiography: TIG 4.0 Catheter   TR Band:  1700 Hours, 112 mL air Venous Sheath: Removed in Cath Lab.  FINDINGS:  Hemodynamics:   SaO2%  Pressures mmHg  Mean P  mmHg  EDP  mmHg   Right Atrium    4/4    2   Right Ventricle    22/0    2   Pulmonary Artery   55%   22/10   14    PCWP    8/7   7    Central Aortic   91%   101/71  83    Left Ventricle    101/6    13          Cardiac Output:   Cardiac Index:    Fick   2.96    1.7    Thermodilution   3.12    1.8     Coronary Anatomy: Angiographically normal coronary arteries, with slow flow do to poor EF  Left Main: Large-caliber vessel that appears to trifurcates into the  LAD, Ramus Intermedius, and Circumflex. Angiographically normal. LAD: Large-caliber wraparound LAD. Mild 10-20% stenoses at the branch point with a large septal trunk and first diagonal (D1) branch. Just prior to this bifurcation there is a 6 significant SP1 and then at this location there is a bifurcating SP 2.  This large-caliber vessel that wraps around the apex and perfuses the distal inferoapex  D1: Large-caliber bifurcating vessel with several other small branches. Angiographically normal.  Left Circumflex: Large-caliber vessel with a couple small proximal marginal branches. Vessel bifurcates in the AV groove into a small AV groove branch followed by the OM branch which gives off OM1 and OM 2/LPL 1. Angiographically normal  OM1: Moderate large-caliber somewhat tortuous vessel that reaches two thirds the way to the inferolateral apex. Angiographically normal  Ramus intermedius: Large-caliber vessel, at least as big as the LAD that courses mostly as a proximal OM1. Has a major moderate caliber branch in the mid vessel. The vessel itself reaches down to the inferoapex similar to the LAD. Angiographically normal   RCA: Large caliber, dominant vessel with a proximal conus branch followed by a mid-vessel moderate caliber RV marginal branch. The vessel bifurcates distally into the Right Posterior Descending Artery  (RPDA) and the  Right Posterior AV Groove Branch (RPAV). Angiographically normal RCA system  RPDA: Moderate large-caliber vessel which reaches two thirds the way to the apex tapering.  RPL Sysytem:The RPAV begins as a large caliber, that bifurcates into 2 posterior lateral branches. PL 1 begin a moderate caliber vessel bifurcates into 2 small caliber vessels. PL 2 continuation of the RPAV that is somewhat tortuous and covers a significant portion of the inferolateral/posterolateral wall.  Left Ventriculography: 10 mL hand-injection revealed dilated left ventricle with EF roughly  15-20%  MEDICATIONS:  Anesthesia:  Local Lidocaine 10 ml  Sedation:  1 mg IV Versed, 75 mcg IV fentanyl ;   Omnipaque Contrast: 70 ml  Anticoagulation:  IV Heparin 3000 Units  Radial Cocktail: 5 mg Verapamil, 400 mcg NTG, 2 ml 2% Lidocaine in 10 ml NS  PATIENT DISPOSITION:    The patient was transferred to the PACU holding area in a hemodynamicaly stable, chest pain free condition.  The patient tolerated the procedure well, and there were no complications.  EBL:   < 10 ml  The patient was stable before, during, and after the procedure.  POST-OPERATIVE DIAGNOSIS:    Severe dilated Nonischemic Dilated Cardiomyopathy  Adequately diuresis with relatively normal right heart pressures.  Severely decreased cardiac output and index.  PLAN OF CARE:  Standard post radial cath care.   Continue to optimize medical therapy for Nonischemic Cardiomyopathy and Atrial Fibrillation.  Restart Eliquis tonight   Peter Mclean, M.D., M.S. [facility] [address] [place], [place]  [address]  [phone]  10/13/2013 5:23 PM

## 2013-10-13 NOTE — Interval H&P Note (Signed)
History and Physical Interval Note:  10/13/2013 3:40 PM  Peter Mclean  has presented today for surgery, with the diagnosis of Acute onset CHF with severely reduced EF & new Dx of Afib.  The various methods of treatment have been discussed with the patient and family. After consideration of risks, benefits and other options for treatment, the patient has consented to  Procedure(s): LEFT AND RIGHT HEART CATHETERIZATION WITH CORONARY ANGIOGRAM (N/A) +/- PCI as a surgical intervention .  The patient's history has been reviewed, patient examined, no change in status, stable for surgery.  I have reviewed the patient's chart and labs.  Questions were answered to the patient's satisfaction.     Peter Mclean,Peter Mclean  Cath Lab Visit (complete for each Cath Lab visit)  Clinical Evaluation Leading to the Procedure:   ACS: no  Non-ACS:    Anginal Classification: No Symptoms; Presented with CHF  Anti-ischemic medical therapy: Maximal Therapy (2 or more classes of medications)  Non-Invasive Test Results: Equivocal test results  Prior CABG: No previous CABG

## 2013-10-13 NOTE — Progress Notes (Signed)
  Echocardiogram 2D Echocardiogram has been performed.  Peter Mclean 10/13/2013, 9:20 AM

## 2013-10-13 NOTE — Care Management Note (Signed)
    Page 1 of 2   10/19/2013     2:50:33 PM   CARE MANAGEMENT NOTE 10/19/2013  Patient:  Peter Mclean, Peter Mclean   Account Number:  [id number]  Date Initiated:  10/13/2013  Documentation initiated by:  Junius Creamer  Subjective/Objective Assessment:   adm w chf     Action/Plan:   lives w wife, pcp dr Benedetto Goad   Anticipated DC Date:  10/19/2013   Anticipated DC Plan:  HOME W HOME HEALTH SERVICES      DC Planning Services  CM consult  Medication Assistance      [facility] Choice  HOME HEALTH   Choice offered to / List presented to:  C-1 Patient        HH arranged  HH-1 RN  HH-10 DISEASE MANAGEMENT      HH agency  [facility]   Status of service:  Completed, signed off Medicare Important Message given?   (If response is "NO", the following Medicare IM given date fields will be blank) Date Medicare IM given:   Date Additional Medicare IM given:    Discharge Disposition:  HOME W HOME HEALTH SERVICES  Per UR Regulation:  Reviewed for med. necessity/level of care/duration of stay  If discussed at Long Length of Stay Meetings, dates discussed:   10/18/2013    Comments:  10/19/13 Zaidee Rion,RN,BSN [phone] PT FOR DC HOME TODAY WITH FAMILY.  ORDER OBTAINED FOR HHRN FOR CHF FOLLOW UP; REFERRAL TO [facility], PER PT/FAM CHOICE. START OF CARE 24-48H POST DC DATE.  10/17/13 Carrin Vannostrand,RN,BSN [phone] CARDIOVERSION TODAY UNSUCCESSFUL.  START AMIO LOAD.  1/29 1203 debbie dowell rn,bsn gave pt eliquis 30day free and copay asssit card. pt has 75.00 per month copay s copay card.

## 2013-10-14 ENCOUNTER — Encounter (HOSPITAL_COMMUNITY)
Admission: EM | Disposition: A | Discharge status: Home / Self Care | Payer: Self-pay | Source: Home / Self Care | Attending: Cardiovascular Disease

## 2013-10-14 DIAGNOSIS — I10 Essential (primary) hypertension: Secondary | ICD-10-CM

## 2013-10-14 DIAGNOSIS — Z9119 Patient's noncompliance with other medical treatment and regimen: Secondary | ICD-10-CM

## 2013-10-14 DIAGNOSIS — Z91199 Patient's noncompliance with other medical treatment and regimen due to unspecified reason: Secondary | ICD-10-CM

## 2013-10-14 DIAGNOSIS — I428 Other cardiomyopathies: Secondary | ICD-10-CM | POA: Diagnosis present

## 2013-10-14 SURGERY — ECHOCARDIOGRAM, TRANSESOPHAGEAL
Anesthesia: Moderate Sedation

## 2013-10-14 MED ORDER — DIGOXIN 125 MCG PO TABS
0.1250 mg | ORAL_TABLET | Freq: Every day | ORAL | Status: DC
  Administered 2013-10-15 – 2013-10-18 (×4): 0.125 mg via ORAL
  Filled 2013-10-14 (×5): qty 1

## 2013-10-14 MED ORDER — LEVALBUTEROL HCL 0.63 MG/3ML IN NEBU
0.6300 mg | INHALATION_SOLUTION | Freq: Four times a day (QID) | RESPIRATORY_TRACT | Status: DC | PRN
  Filled 2013-10-14: qty 3

## 2013-10-14 MED ORDER — DIGOXIN 250 MCG PO TABS
0.2500 mg | ORAL_TABLET | Freq: Four times a day (QID) | ORAL | Status: AC
  Administered 2013-10-14 – 2013-10-15 (×3): 0.25 mg via ORAL
  Filled 2013-10-14 (×3): qty 1

## 2013-10-14 NOTE — Progress Notes (Signed)
Subjective:  Looks a little weak but wants to go home. He still has a cough. Right groin remains quite sore  Objective:  Vital Signs in the last 24 hours: Temp:  [97.3 F (36.3 C)-97.7 F (36.5 C)] 97.4 F (36.3 C) (01/30 0346) Pulse Rate:  [69-109] 86 (01/30 0500) Resp:  [18-24] 18 (01/30 0346) BP: (90-133)/(63-90) 98/77 mmHg (01/30 0500) SpO2:  [92 %-97 %] 92 % (01/30 0500) Weight:  [134 lb 7.7 oz (61 kg)] 134 lb 7.7 oz (61 kg) (01/30 0346)  Intake/Output from previous day:  Intake/Output Summary (Last 24 hours) at 10/14/13 0720 Last data filed at 10/13/13 2200  Gross per 24 hour  Intake 888.75 ml  Output   1150 ml  Net -261.25 ml    Physical Exam: General appearance: alert, cooperative, cachectic and no distress Lungs: expiratory wheezing Heart: irregularly irregular rhythm Extremities: Rt groin without hematoma, Rt wrist without hematoma   Rate: 90  Rhythm: atrial fibrillation  Lab Results:  Recent Labs  10/12/13 1025  WBC 10.2  HGB 15.9  PLT 180    Recent Labs  10/12/13 1025 10/13/13 0317  NA 143 142  K 4.1 3.7  CL 106 103  CO2 23 23  GLUCOSE 114* 91  BUN 16 14  CREATININE 0.92 1.03    Recent Labs  10/13/13 0110 10/13/13 0728  TROPONINI <0.30 <0.30    Recent Labs  10/12/13 2150  INR 1.10    Imaging: Imaging results have been reviewed  Cardiac Studies:  Assessment/Plan:   Principal Problem:   CHF (congestive heart failure) Active Problems:   Atrial fibrillation with rapid ventricular response   NICM (nonischemic cardiomyopathy)- EF 15%, cath 10/13/13   HTN (hypertension)   ANXIETY   Dyslipidemia (high LDL; low HDL)   Non compliance with medical treatment- stopped meds prior to adm   Smoker- he says he has quit   PLAN: Stop IV Lasix, he may need po diuretics at discharge. Xopenex HHN.  B/P is soft, not sure he can tolerate ACE/ARB. Consider addition of Lanoxin for LVD/AF and possibly decreasing Diltiazem.  ? Is he a candidate  for a Life Vest  Quintella Baton [phone] 10/14/2013, 7:20 AM  I have seen and evaluated the patient today. Also discussed this plan with Dr. Sherryl Manges due to and the atrial fibrillation. Significantly reduced ejection fraction with nonischemic cardiomyopathy. It would be reasonable to suggest that rapid A. fib is the etiology as this was only been going on for the past week or so to. He still remains borderline tachycardic with heart rates in the 110 to 115 range. Additional rate control it prudent, and I'm reluctant to use to much delayed diltiazem for additional rate control.  I think that the best plan for him would be to get him out of atrial fibrillation. Dr. Sherryl Manges agrees.    Plan:  Continue anticoagulation with Eliquis..  Continue with rate control, and monitor for bradycardia. Will add digoxin to avoid further hypotension  Plan TEE cardioversion on Monday  Could probably be Lasix for one day. We will need to restart by mouth Lasix before the weekend is over  Icepack for groin that is sore post catheterization.  I do think that he would be reasonable to consider for life vest placement of on discharge, as he has a high likelihood of some recovery of function.   Marykay Lex, M.D., M.S. [facility] [address] [place], [place]  [id number]  [phone] Pager # [phone] 10/14/2013 2:22 PM

## 2013-10-15 MED ORDER — CARVEDILOL 6.25 MG PO TABS
6.2500 mg | ORAL_TABLET | Freq: Two times a day (BID) | ORAL | Status: DC
  Administered 2013-10-15 – 2013-10-16 (×3): 6.25 mg via ORAL
  Filled 2013-10-15 (×5): qty 1

## 2013-10-15 MED ORDER — DILTIAZEM HCL ER COATED BEADS 120 MG PO CP24
120.0000 mg | ORAL_CAPSULE | Freq: Every day | ORAL | Status: DC
  Administered 2013-10-15 – 2013-10-16 (×2): 120 mg via ORAL
  Filled 2013-10-15 (×2): qty 1

## 2013-10-15 MED ORDER — LOSARTAN POTASSIUM 25 MG PO TABS
25.0000 mg | ORAL_TABLET | Freq: Every day | ORAL | Status: DC
  Administered 2013-10-15 – 2013-10-19 (×5): 25 mg via ORAL
  Filled 2013-10-15 (×5): qty 1

## 2013-10-15 NOTE — Discharge Instructions (Addendum)
Information on my medicine - ELIQUIS (apixaban)  This medication education was reviewed with me or my healthcare representative as part of my discharge preparation.  The pharmacist that spoke with me during my hospital stay was:  Harland GermanAndrew Meyer, PharmD  Why was Eliquis prescribed for you? Eliquis was prescribed for you to reduce the risk of a blood clot forming that can cause a stroke if you have a medical condition called atrial fibrillation (a type of irregular heartbeat).  What do You need to know about Eliquis ? Take your Eliquis TWICE DAILY - one tablet in the morning and one tablet in the evening with or without food. If you have difficulty swallowing the tablet whole please discuss with your pharmacist how to take the medication safely.  Take Eliquis exactly as prescribed by your doctor and DO NOT stop taking Eliquis without talking to the doctor who prescribed the medication.  Stopping may increase your risk of developing a stroke.  Refill your prescription before you run out.  After discharge, you should have regular check-up appointments with your healthcare provider that is prescribing your Eliquis.  In the future your dose may need to be changed if your kidney function or weight changes by a significant amount or as you get older.  What do you do if you miss a dose? If you miss a dose, take it as soon as you remember on the same day and resume taking twice daily.  Do not take more than one dose of ELIQUIS at the same time to make up a missed dose.  Important Safety Information A possible side effect of Eliquis is bleeding. You should call your healthcare provider right away if you experience any of the following:   Bleeding from an injury or your nose that does not stop.   Unusual colored urine (red or dark brown) or unusual colored stools (red or black).   Unusual bruising for unknown reasons.   A serious fall or if you hit your head (even if there is no bleeding).  Some  medicines may interact with Eliquis and might increase your risk of bleeding or clotting while on Eliquis. To help avoid this, consult your healthcare provider or pharmacist prior to using any new prescription or non-prescription medications, including herbals, vitamins, non-steroidal anti-inflammatory drugs (NSAIDs) and supplements.  This website has more information on Eliquis (apixaban): [email or web address].   POST CARDIAC CATH  Refer to this sheet in the next few weeks. These instructions provide you with information on caring for yourself after your procedure. Your caregiver may also give you more specific instructions. Your treatment has been planned according to current medical practices, but problems sometimes occur. Call your caregiver if you have any problems or questions after your procedure. HOME CARE INSTRUCTIONS  You may shower the day after the procedure.Remove the bandage (dressing) and gently wash the site with plain soap and water.Gently pat the site dry.   Do not apply powder or lotion to the site.   Do not submerge the affected site in water for 3 to 5 days.   Inspect the site at least twice daily.   Do not flex or bend the affected arm for 24 hours.   No lifting over 5 pounds (2.3 kg) for 5 days after your procedure.   Do not drive home if you are discharged the same day of the procedure. Have someone else drive you.   You may drive 24 hours after the procedure unless otherwise instructed by your caregiver.  What to expect:  Any bruising will usually fade within 1 to 2 weeks.   Blood that collects in the tissue (hematoma) may be painful to the touch. It should usually decrease in size and tenderness within 1 to 2 weeks.  SEEK IMMEDIATE MEDICAL CARE IF:  You have unusual pain at the radial site.   You have redness, warmth, swelling, or pain at the radial site.   You have drainage (other than a small amount of blood on the dressing).   You have chills.    You have a fever or persistent symptoms for more than 72 hours.   You have a fever and your symptoms suddenly get worse.   Your arm becomes pale, cool, tingly, or numb.   You have heavy bleeding from the site. Hold pressure on the site.

## 2013-10-15 NOTE — Progress Notes (Signed)
ANTICOAGULATION CONSULT NOTE - Follow-up Consult  Pharmacy Consult for Apixaban Indication: atrial fibrillation  No Known Allergies    Vital Signs: Temp: 97.9 F (36.6 C) (01/31 0939) Temp src: Oral (01/31 0939) BP: 143/92 mmHg (01/31 0939) Pulse Rate: 60 (01/31 0939)  Labs:  Recent Labs  10/12/13 2150 10/13/13 0110 10/13/13 0317 10/13/13 0728  APTT 34  --   --   --   LABPROT 14.0  --   --   --   INR 1.10  --   --   --   CREATININE  --   --  1.03  --   TROPONINI <0.30 <0.30  --  <0.30    Estimated Creatinine Clearance: 64.8 ml/min (by C-G formula based on Cr of 1.03).  Assessment: 63 year old male on apixaban for afib. Noted cardiology is adjusting medications. Renal function remains stable. No bleeding noted.  Goal of Therapy:  Therapeutic anticoagulation   Plan:  Apixaban 5mg  PO BID Will f/u for any bleeding  Christoper Fabian, PharmD, BCPS Clinical pharmacist, pager [phone] 10/15/2013,11:26 AM

## 2013-10-15 NOTE — Progress Notes (Signed)
Primary cardiologist: Dr. Hillis RangeJames Allred  Subjective:    Some soreness in right femoral venous access site. No chest pain or palpitations.  Objective:   Temp:  [97.6 F (36.4 C)] 97.6 F (36.4 C) (01/31 0354) Pulse Rate:  [33-112] 112 (01/31 0800) Resp:  [16-18] 16 (01/31 0800) BP: (94-150)/(69-108) 147/108 mmHg (01/31 0800) SpO2:  [94 %-98 %] 95 % (01/31 0800) Weight:  [135 lb 12.9 oz (61.6 kg)] 135 lb 12.9 oz (61.6 kg) (01/31 0354) Last BM Date: 10/13/13  Filed Weights   10/13/13 0424 10/14/13 0346 10/15/13 0354  Weight: 138 lb 3.7 oz (62.7 kg) 134 lb 7.7 oz (61 kg) 135 lb 12.9 oz (61.6 kg)    Intake/Output Summary (Last 24 hours) at 10/15/13 0818 Last data filed at 10/14/13 1912  Gross per 24 hour  Intake    820 ml  Output    625 ml  Net    195 ml    Telemetry: Atrial fibrillation, mildly increased rates.  Exam:  General: No distress.  Lungs: Clear, diminished breath sounds.  Cardiac: Indistinct PMI, irregularly irregular.  Abdomen: NABS.  Extremities: No pitting. Right radial access site stable.   Lab Results:  Basic Metabolic Panel:  Recent Labs Lab 10/12/13 1025 10/13/13 0317  NA 143 142  K 4.1 3.7  CL 106 103  CO2 23 23  GLUCOSE 114* 91  BUN 16 14  CREATININE 0.92 1.03  CALCIUM 9.2 9.1    Liver Function Tests:  Recent Labs Lab 10/13/13 0317  AST 19  ALT 24  ALKPHOS 87  BILITOT 2.3*  PROT 6.3  ALBUMIN 3.4*    CBC:  Recent Labs Lab 10/12/13 1025  WBC 10.2  HGB 15.9  HCT 46.4  MCV 90.4  PLT 180    Echocardiogram (1/29): Study Conclusions  - Left ventricle: The cavity size was mildly dilated. Systolic function was severely reduced. The estimated ejection fraction was in the range of 10% to 15%. Diffuse hypokinesis. - Right ventricle: The cavity size was moderately dilated. Systolic function was mildly to moderately reduced. - Right atrium: The atrium was mildly dilated.    Medications:   Scheduled  Medications: . apixaban  5 mg Oral BID  . aspirin EC  81 mg Oral Daily  . carvedilol  3.125 mg Oral BID WC  . cyclobenzaprine  5 mg Oral QPM  . digoxin  0.125 mg Oral Daily  . diltiazem  180 mg Oral Daily  . sodium chloride  3 mL Intravenous Q12H  . triamcinolone  2 spray Each Nare Daily      PRN Medications:  sodium chloride, acetaminophen, ALPRAZolam, chlorpheniramine-HYDROcodone, HYDROcodone-acetaminophen, ipratropium, levalbuterol, morphine injection, ondansetron (ZOFRAN) IV, sodium chloride, zolpidem   Assessment:   1. Nonischemic cardiomyopathy, LVEF approximately 15%. Mean wedge pressure 7 mm mercury, PASP 22 mmHg, low cardiac output.  2. Minor coronary atherosclerosis at cardiac catheterization in 10/14/13.  3. Paroxysmal to persistent atrial fibrillation, also history of atrial flutter. Rates not adequately controlled.  4. Noncompliance with medications.  5. Recent URI.  6. Hypertension.   Plan/Discussion:    Not ready for discharge. Medications need significant adjustment. Would continue on Eliquis, stop aspirin. Coreg will be increased to 6.25 mg twice daily, reduce Cardizem CD 120 mg daily. Continue digoxin. Add ARB. Would ultimately try and get him off calcium channel blocker due to severe cardiomyopathy. Hopefully heart rate can be adequate control by increasing beta blocker and continuing digoxin. Does not have any significant fluid overload  at this time. No diuretic for now. Whether he needs cardioversion as an inpatient is not entirely clear. This will depend on how he does clinically and control of his atrial fibrillation. He would need a TEE DCCV. Will keep him over the weekend.   Jonelle Sidle, M.D., F.A.C.C.

## 2013-10-16 LAB — CBC
HCT: 43.5 % (ref 39.0–52.0)
Hemoglobin: 14.7 g/dL (ref 13.0–17.0)
MCH: 30.6 pg (ref 26.0–34.0)
MCHC: 33.8 g/dL (ref 30.0–36.0)
MCV: 90.6 fL (ref 78.0–100.0)
Platelets: 159 10*3/uL (ref 150–400)
RBC: 4.8 MIL/uL (ref 4.22–5.81)
RDW: 13.5 % (ref 11.5–15.5)
WBC: 9.3 10*3/uL (ref 4.0–10.5)

## 2013-10-16 LAB — BASIC METABOLIC PANEL
BUN: 21 mg/dL (ref 6–23)
CO2: 28 mEq/L (ref 19–32)
Calcium: 8.8 mg/dL (ref 8.4–10.5)
Chloride: 102 mEq/L (ref 96–112)
Creatinine, Ser: 1.24 mg/dL (ref 0.50–1.35)
GFR calc Af Amer: 70 mL/min — ABNORMAL LOW (ref >90)
GFR calc non Af Amer: 61 mL/min — ABNORMAL LOW (ref >90)
Glucose, Bld: 81 mg/dL (ref 70–99)
Potassium: 4.2 mEq/L (ref 3.7–5.3)
Sodium: 141 mEq/L (ref 137–147)

## 2013-10-16 MED ORDER — CARVEDILOL 12.5 MG PO TABS
12.5000 mg | ORAL_TABLET | Freq: Two times a day (BID) | ORAL | Status: DC
  Administered 2013-10-16 – 2013-10-19 (×6): 12.5 mg via ORAL
  Filled 2013-10-16 (×8): qty 1

## 2013-10-16 NOTE — Progress Notes (Signed)
Pt tolerated oob ambulation in room, has declined ambulation in hallway at this time, states he will walk after daughter visits Peter Mclean

## 2013-10-16 NOTE — Progress Notes (Signed)
Patient Name: Peter Mclean Date of Encounter: 10/16/2013  Principal Problem:   CHF (congestive heart failure) Active Problems:   ANXIETY   Atrial fibrillation with rapid ventricular response   HTN (hypertension)   Dyslipidemia (high LDL; low HDL)   NICM (nonischemic cardiomyopathy)- EF 15%, cath 10/13/13   Non compliance with medical treatment- stopped meds prior to adm   Smoker   Length of Stay: 4  SUBJECTIVE  Substantially better - no dyspnea with walking HR consistently 60-80  CURRENT MEDS . apixaban  5 mg Oral BID  . carvedilol  6.25 mg Oral BID WC  . cyclobenzaprine  5 mg Oral QPM  . digoxin  0.125 mg Oral Daily  . diltiazem  120 mg Oral Daily  . losartan  25 mg Oral Daily  . sodium chloride  3 mL Intravenous Q12H  . triamcinolone  2 spray Each Nare Daily    OBJECTIVE  Filed Vitals:   10/15/13 0939 10/15/13 1700 10/15/13 1950 10/16/13 0343  BP: 143/92 110/83 98/60 111/68  Pulse: 60 75 78 65  Temp: 97.9 F (36.6 C) 97.8 F (36.6 C) 97.9 F (36.6 C) 97.3 F (36.3 C)  TempSrc: Oral Oral Oral Oral  Resp: 16 16 18 18   Height:      Weight:    60 kg (132 lb 4.4 oz)  SpO2: 98% 95% 96% 96%    Intake/Output Summary (Last 24 hours) at 10/16/13 0956 Last data filed at 10/15/13 1700  Gross per 24 hour  Intake    240 ml  Output      0 ml  Net    240 ml   Filed Weights   10/14/13 0346 10/15/13 0354 10/16/13 0343  Weight: 61 kg (134 lb 7.7 oz) 61.6 kg (135 lb 12.9 oz) 60 kg (132 lb 4.4 oz)    PHYSICAL EXAM  General: Alert, oriented x3, no distress Head: no evidence of trauma, PERRL, EOMI, no exophtalmos or lid lag, no myxedema, no xanthelasma; normal ears, nose and oropharynx Neck: normal jugular venous pulsations and no hepatojugular reflux; brisk carotid pulses without delay and no carotid bruits Chest: clear to auscultation, no signs of consolidation by percussion or palpation, normal fremitus, symmetrical and full respiratory excursions Cardiovascular:  laterally displaced apical impulse, irregular rhythm, normal first and second heart sounds, no murmurs, rubs or gallops Abdomen: no tenderness or distention, no masses by palpation, no abnormal pulsatility or arterial bruits, normal bowel sounds, no hepatosplenomegaly Extremities: no clubbing, cyanosis or edema; 2+ radial, ulnar and brachial pulses bilaterally; 2+ right femoral, posterior tibial and dorsalis pedis pulses; 2+ left femoral, posterior tibial and dorsalis pedis pulses; no subclavian or femoral bruits Neurological: grossly nonfocal   Accessory Clinical Findings  CBC  Recent Labs  10/16/13 0404  WBC 9.3  HGB 14.7  HCT 43.5  MCV 90.6  PLT 159   Basic Metabolic Panel  Recent Labs  10/16/13 0404  NA 141  K 4.2  CL 102  CO2 28  GLUCOSE 81  BUN 21  CREATININE 1.24  CALCIUM 8.8    Radiology/Studies  Ct Angio Chest Pe W/cm &/or Wo Cm  10/12/2013   CLINICAL DATA:  Pleuritic chest pain, new onset AFib  EXAM: CT ANGIOGRAPHY CHEST WITH CONTRAST  TECHNIQUE: Multidetector CT imaging of the chest was performed using the standard protocol during bolus administration of intravenous contrast. Multiplanar CT image reconstructions including MIPs were obtained to evaluate the vascular anatomy.  CONTRAST:  <MEASUREMChasebMerLorin PicPuD iaLinwood RSarMerLorin PicketAdventist Medical Center-SePuD iaLinwood RMelbourne VillMerLorin PicketGood Samaritan Hospital-Los AngePuD ia[place] RYaMerLorin PicketLehigh Valley Hospital SchuylkPuD ia[place] RSan Juan CapistrMerLorin PicketHillside Endoscopy Center PuD iaLin[place] RGang MiMerLorin PicketFresno Surgical HospiPuD ia[place] RKingslMerLorin PicketBaptist Health - Heber SpriPuD ia[place] RLincoln VillMerLorin PicketMedical Center Surgery AssociatesPuD ia[place] ROakfiMerLorin PicketTexas Endoscopy Centers PuD ia[place] RMount CarMerLorin PicketSaint Thomas River Park HospiPuDalia Headingck IOHEXOL 350 MG/ML SOLN  COMPARISON:  None.  FINDINGS: There is adequate opacification of the pulmonary arteries. There is no pulmonary embolus. The main pulmonary artery, right main pulmonary artery and left main pulmonary arteries are normal in size. The heart size is normal. There is no pericardial effusion. There is coronary artery atherosclerosis in the LAD.  There are bilateral small pleural effusions. There is mild interstitial thickening bilaterally. There is bibasilar dependent atelectasis. There is no focal consolidation.  There is mild nonspecific right hilar lymphadenopathy measuring 14 mm in short axis. There is a 14 mm AP window lymph node.There are multiple  other subcentimeter mediastinal lymph nodes.  There is no lytic or blastic osseous lesion. Mild degenerative disc disease throughout the thoracic spine. No compression fracture or static listhesis.  Partially visualized 3 mm left nephrolithiasis.  Review of the MIP images confirms the above findings.  IMPRESSION: 1. No CT evidence of pulmonary embolus. 2. Bilateral pleural effusions and interstitial thickening concerning for mild pulmonary edema. 3. Coronary artery disease primarily involving the LAD. 4. Mild nonspecific mediastinal and right hilar lymphadenopathy.   Electronically Signed   By: Elige Ko   On: 10/12/2013 13:08   Dg Chest Port 1 View  10/12/2013   CLINICAL DATA:  63 year old male with shortness of breath and palpitations. Initial encounter.  EXAM: PORTABLE CHEST - 1 VIEW  COMPARISON:  02/23/2011 and earlier.  FINDINGS: Portable AP view at 1028 hrs. Stable lung volumes. Normal cardiac size and mediastinal contours. Visualized tracheal air column is within normal limits. No pneumothorax. No consolidation or definite pleural effusion. Increased bilateral basilar predominant interstitial markings. No confluent pulmonary opacity.  IMPRESSION: Stable chest except for increased basilar predominant interstitial markings, could reflect increased interstitial changes since 2012. Viral / atypical respiratory infection also is a consideration.   Electronically Signed   By: Augusto Gamble M.D.   On: 10/12/2013 10:37    TELE AF with controlled rate  CARDIAC CATH Minor coronary atherosclerosis  Hemodynamics:   SaO2%  Pressures mmHg  Mean P  mmHg  EDP  mmHg   Right Atrium   4/4   2   Right Ventricle   22/0   2   Pulmonary Artery  55%  22/10  14    PCWP   8/7  7    Central Aortic  91%  101/71  83    Left Ventricle   101/6   13          Cardiac Output:   Cardiac Index:    Fick  2.96   1.7    Thermodilution  3.12   1.8       ASSESSMENT AND PLAN  1. Acute systolic heart failure - markedly  improved, appears clinically euvolemic and had normal filling pressures at [facility] 2. 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 Only time will tell the difference. 3. Persistent AFib - plan for TEE-guided cardioversion tomorrow. This procedure has been fully reviewed with the patient and informed consent has been obtained. Continue Eliquis for at least 30 days after successful cardioversion, indefinitely if EF remains low. Will ask Dr. Jenel Lucks recommendation regarding short/long term antiarrhythmics.   Thurmon Fair, MD, [facility] [phone] office [phone] pager 10/16/2013 9:56 AM

## 2013-10-17 ENCOUNTER — Encounter (HOSPITAL_COMMUNITY): Payer: PRIVATE HEALTH INSURANCE | Admitting: Anesthesiology

## 2013-10-17 ENCOUNTER — Encounter (HOSPITAL_COMMUNITY): Payer: Self-pay | Admitting: Anesthesiology

## 2013-10-17 ENCOUNTER — Inpatient Hospital Stay (HOSPITAL_COMMUNITY): Payer: PRIVATE HEALTH INSURANCE | Admitting: Anesthesiology

## 2013-10-17 ENCOUNTER — Encounter (HOSPITAL_COMMUNITY)
Admission: EM | Disposition: A | Discharge status: Home / Self Care | Payer: Self-pay | Source: Home / Self Care | Attending: Cardiovascular Disease

## 2013-10-17 DIAGNOSIS — I4892 Unspecified atrial flutter: Secondary | ICD-10-CM

## 2013-10-17 HISTORY — PX: TEE WITHOUT CARDIOVERSION: SHX5443

## 2013-10-17 HISTORY — PX: CARDIOVERSION: SHX1299

## 2013-10-17 SURGERY — ECHOCARDIOGRAM, TRANSESOPHAGEAL
Anesthesia: Moderate Sedation

## 2013-10-17 MED ORDER — BUTAMBEN-TETRACAINE-BENZOCAINE 2-2-14 % EX AERO
INHALATION_SPRAY | CUTANEOUS | Status: DC | PRN
  Administered 2013-10-17: 2 via TOPICAL

## 2013-10-17 MED ORDER — SODIUM CHLORIDE 0.9 % IV SOLN
INTRAVENOUS | Status: DC | PRN
  Administered 2013-10-17: 13:00:00 via INTRAVENOUS

## 2013-10-17 MED ORDER — FENTANYL CITRATE 0.05 MG/ML IJ SOLN
INTRAMUSCULAR | Status: DC | PRN
Start: 2013-10-17 — End: 2013-10-17
  Administered 2013-10-17: 25 ug via INTRAVENOUS

## 2013-10-17 MED ORDER — AMIODARONE HCL 200 MG PO TABS
400.0000 mg | ORAL_TABLET | Freq: Two times a day (BID) | ORAL | Status: DC
  Administered 2013-10-17 – 2013-10-18 (×4): 400 mg via ORAL
  Filled 2013-10-17 (×6): qty 2

## 2013-10-17 MED ORDER — MIDAZOLAM HCL 5 MG/ML IJ SOLN
INTRAMUSCULAR | Status: AC
  Filled 2013-10-17: qty 2

## 2013-10-17 MED ORDER — SODIUM CHLORIDE 0.9 % IV SOLN
INTRAVENOUS | Status: DC
  Administered 2013-10-17: 12:00:00 via INTRAVENOUS

## 2013-10-17 MED ORDER — FENTANYL CITRATE 0.05 MG/ML IJ SOLN
INTRAMUSCULAR | Status: AC
  Filled 2013-10-17: qty 2

## 2013-10-17 MED ORDER — ETOMIDATE 2 MG/ML IV SOLN
INTRAVENOUS | Status: DC | PRN
  Administered 2013-10-17: 14 mg via INTRAVENOUS

## 2013-10-17 MED ORDER — MIDAZOLAM HCL 10 MG/2ML IJ SOLN
INTRAMUSCULAR | Status: DC | PRN
  Administered 2013-10-17: 2 mg via INTRAVENOUS

## 2013-10-17 MED FILL — Triamcinolone Acetonide Nasal Aerosol Suspension 55 MCG/ACT: NASAL | Qty: 10.8 | Status: AC

## 2013-10-17 NOTE — Progress Notes (Signed)
         Subjective:   Objective: Vital signs in last 24 hours: Temp:  [97.3 F (36.3 C)-97.9 F (36.6 C)] 97.3 F (36.3 C) (02/02 0510) Pulse Rate:  [67-85] 67 (02/02 0510) Resp:  [16-18] 18 (02/02 0510) BP: (110-125)/(61-83) 125/83 mmHg (02/02 0510) SpO2:  [95 %] 95 % (02/02 0510) Weight:  [132 lb 15 oz (60.3 kg)] 132 lb 15 oz (60.3 kg) (02/02 0510) Weight change: 10.6 oz (0.3 kg) Last BM Date: 10/14/13 Intake/Output from previous day: +240 02/01 0701 - 02/02 0700 In: 120 [P.O.:120] Out: -  Intake/Output this shift:    PE: General:Pleasant affect, NAD Skin:Warm and dry, brisk capillary refill HEENT:normocephalic, sclera clear, mucus membranes moist Neck:supple, no JVD, no bruits  Heart:irreg irreg without murmur, gallup, rub or click Lungs: with fine rales in bases, no rhonchi or wheezes NUU:VOZD, non tender, + BS, do not palpate liver spleen or masses Ext:no lower ext edema, 2+ pedal pulses, 2+ radial pulses Neuro:alert and oriented, MAE, follows commands, + facial symmetry  tele:  A flutter slower this AM in the 50s  Lab Results:  Recent Labs  10/16/13 0404  WBC 9.3  HGB 14.7  HCT 43.5  PLT 159   BMET  Recent Labs  10/16/13 0404  NA 141  K 4.2  CL 102  CO2 28  GLUCOSE 81  BUN 21  CREATININE 1.24  CALCIUM 8.8   No results found for this basename: TROPONINI, CK, MB,  in the last 72 hours  Lab Results  Component Value Date   CHOL 162 10/13/2013   HDL 34* 10/13/2013   LDLCALC 113* 10/13/2013   TRIG 74 10/13/2013   CHOLHDL 4.8 10/13/2013   Lab Results  Component Value Date   HGBA1C 5.8* 10/12/2013     Lab Results  Component Value Date   TSH 1.812 10/12/2013       Studies/Results: No results found.  Medications: I have reviewed the patient's current medications. Scheduled Meds: . apixaban  5 mg Oral BID  . carvedilol  12.5 mg Oral BID WC  . cyclobenzaprine  5 mg Oral QPM  . digoxin  0.125 mg Oral Daily  . losartan  25 mg Oral Daily   . sodium chloride  3 mL Intravenous Q12H  . triamcinolone  2 spray Each Nare Daily   Continuous Infusions: . sodium chloride     PRN Meds:.sodium chloride, acetaminophen, ALPRAZolam, chlorpheniramine-HYDROcodone, HYDROcodone-acetaminophen, ipratropium, levalbuterol, morphine injection, ondansetron (ZOFRAN) IV, sodium chloride, zolpidem  Assessment/Plan: Principal Problem:   CHF (congestive heart failure) Active Problems:   ANXIETY   Atrial fibrillation with rapid ventricular response   HTN (hypertension)   Dyslipidemia (high LDL; low HDL)   NICM (nonischemic cardiomyopathy)- EF 15%, cath 10/13/13   Non compliance with medical treatment- stopped meds prior to adm   Smoker  PLAN: for TEE DCCV today on Eliquis since 10/12/13.  Also on dig. cardizem cd has been stopped, coreg 12.5 BID.  Plan to wean off cardizem secondary to CM with EF 15%.   HR down in the 50s this AM otherwise in 60s.  ? D/c today if DCCV stable?   LOS: 5 days   Time spent with pt. :15  minutes. [facility] R  Nurse Practitioner Certified Pager [phone] or after 5pm and on weekends call [phone] 10/17/2013, 9:07 AM

## 2013-10-17 NOTE — Preoperative (Signed)
Beta Blockers   Reason not to administer Beta Blockers:Not applicable 

## 2013-10-17 NOTE — Anesthesia Postprocedure Evaluation (Signed)
  Anesthesia Post-op Note  Patient: Peter Mclean  Procedure(s) Performed: Procedure(s): TRANSESOPHAGEAL ECHOCARDIOGRAM (TEE) (N/A) CARDIOVERSION (N/A)  Patient Location: PACU  Anesthesia Type:General  Level of Consciousness: awake  Airway and Oxygen Therapy: Patient Spontanous Breathing  Post-op Pain: none  Post-op Assessment: Post-op Vital signs reviewed  Post-op Vital Signs: stable  Complications: No apparent anesthesia complications

## 2013-10-17 NOTE — CV Procedure (Signed)
INDICATIONS: Precardioversion  PROCEDURE:   Informed consent was obtained prior to the procedure. The risks, benefits and alternatives for the procedure were discussed and the patient comprehended these risks.  Risks include, but are not limited to, cough, sore throat, vomiting, nausea, somnolence, esophageal and stomach trauma or perforation, bleeding, low blood pressure, aspiration, pneumonia, infection, trauma to the teeth and death.    After a procedural time-out, the oropharynx was anesthetized with 20% benzocaine spray. The patient was given 2 mg versed and 25 mcg fentanyl for moderate sedation.   The transesophageal probe was inserted in the esophagus and stomach without difficulty and multiple views were obtained.  The patient was kept under observation until the patient left the procedure room.  The patient left the procedure room in stable condition.   Agitated microbubble saline contrast was not administered.  COMPLICATIONS:    There were no immediate complications.  FINDINGS:  No LA thrombus. Normal LA emptying velocities. LA is not significantly dilated. LV systolic function is still severely depressed - global hypokinesis. EF around 25%  RECOMMENDATIONS:   Proceed with cardioversion  Time Spent Directly with the Patient:   Rafael Salway 10/17/2013, 12:28 PM

## 2013-10-17 NOTE — CV Procedure (Signed)
Procedure: Electrical Cardioversion Indications:  Atrial Fibrillation  Procedure Details:  Consent: Risks of procedure as well as the alternatives and risks of each were explained to the (patient/caregiver).  Consent for procedure obtained.  Time Out: Verified patient identification, verified procedure, site/side was marked, verified correct patient position, special equipment/implants available, medications/allergies/relevent history reviewed, required imaging and test results available.  Performed  Patient placed on cardiac monitor, pulse oximetry, supplemental oxygen as necessary.  Sedation given: Anesthesiology, Dr. Jacklynn Bue, 14 mg IV Pacer pads placed anterior and posterior chest.  Cardioverted 3 time(s).  Cardioverted at 120J. synchronized biphasic, two other attempts at 150J synchronized failed to convert rhythm.  Evaluation: Findings: Post procedure EKG shows: NSR for only a few beats. Then converted to atrial fibrillation.  Complications: None Patient did tolerate procedure well.  Will start amiodarone and plan to re attempt in a few weeks, unless EP offers alternative recommendations.  Time Spent Directly with the Patient:  30 minutes   Lexine Jaspers 10/17/2013, 12:45 PM

## 2013-10-17 NOTE — Anesthesia Preprocedure Evaluation (Addendum)
Anesthesia Evaluation  Patient identified by MRN, date of birth, ID band Patient awake    Reviewed: Allergy & Precautions, H&P , NPO status , Patient's Chart, lab work & pertinent test results  Airway Mallampati: II TM Distance: >3 FB Neck ROM: Full    Dental  (+) Edentulous Upper, Edentulous Lower and Dental Advisory Given   Pulmonary Current Smoker,          Cardiovascular hypertension, Pt. on medications +CHF + dysrhythmias Atrial Fibrillation     Neuro/Psych Anxiety    GI/Hepatic   Endo/Other    Renal/GU      Musculoskeletal   Abdominal   Peds  Hematology   Anesthesia Other Findings   Reproductive/Obstetrics                         Anesthesia Physical Anesthesia Plan  ASA: IV  Anesthesia Plan: MAC   Post-op Pain Management:    Induction: Intravenous  Airway Management Planned: Mask  Additional Equipment:   Intra-op Plan:   Post-operative Plan:   Informed Consent: I have reviewed the patients History and Physical, chart, labs and discussed the procedure including the risks, benefits and alternatives for the proposed anesthesia with the patient or authorized representative who has indicated his/her understanding and acceptance.   Dental advisory given  Plan Discussed with: CRNA, Anesthesiologist and Surgeon  Anesthesia Plan Comments:         Anesthesia Quick Evaluation

## 2013-10-17 NOTE — Transfer of Care (Signed)
Immediate Anesthesia Transfer of Care Note  Patient: Peter Mclean  Procedure(s) Performed: Procedure(s): TRANSESOPHAGEAL ECHOCARDIOGRAM (TEE) (N/A) CARDIOVERSION (N/A)  Patient Location: Short Stay  Anesthesia Type:General  Level of Consciousness: awake, alert  and oriented  Airway & Oxygen Therapy: Patient connected to face mask oxygen  Post-op Assessment: Report given to PACU RN  Post vital signs: stable  Complications: No apparent anesthesia complications

## 2013-10-17 NOTE — Anesthesia Procedure Notes (Signed)
Procedure Name: MAC Date/Time: 10/17/2013 12:43 PM Performed by: Ellin Goodie Pre-anesthesia Checklist: Patient identified, Emergency Drugs available, Suction available and Patient being monitored Patient Re-evaluated:Patient Re-evaluated prior to inductionOxygen Delivery Method: Ambu bag Preoxygenation: Pre-oxygenation with 100% oxygen Intubation Type: IV induction Ventilation: Oral airway inserted - appropriate to patient size Dental Injury: Teeth and Oropharynx as per pre-operative assessment

## 2013-10-17 NOTE — Progress Notes (Signed)
Patient seen, examined and notes reviewed.  He is asymptomatic but still in atrial flutter.  He has no evidence of edema on exam today and overall appears euvolemic.  Will plan TEE/DCCV today.  May be able to d/c home later after procedure.  Continue Eliquis/dig/Coreg.  Cardizem has been stopped due to LV dysfunction.  New DCM ? Tachycardia related vs. Primary.  Cath with essentially  Normal coronary arteries.  Would reassess LVF in 1-2 months after DCCV to assess for improvement after NSR established.

## 2013-10-17 NOTE — Consult Note (Signed)
ELECTROPHYSIOLOGY CONSULT NOTE   Patient ID: Peter Mclean MRN: [id number], DOB/AGE: 1951-06-04   Admit date: 10/12/2013 Date of Consult: 10/17/2013  Primary Physician: Pamelia Hoit, MD Primary Cardiologist: Hillis Range, MD Reason for Consultation: Atrial fibrillation  History of Present Illness Peter Mclean is a 63 y.o. male with history of atrial arrhythmias including atrial flutter and atrial fibrillation who has been admitted with SOB, found to have rapid atrial fibrillation and acute systolic HF with new, severe LV dysfunction. He saw Dr. Johney Frame in 2010. ECG at that time showed SR and his echo then showed normal LV function. He was continued on rate control with diltiazem and ASA. Peter Mclean reports stopping all of his medications years ago. Approximately one month ago, he developed an URI. He took antibiotics in addition to steroid taper and inhaler. One week ago, he developed severe DOE which prompted him to present to ED. He also reported persistent cough and orthopnea. In addition he reported abdominal fullness. He denied CP or palpitations. He has not had syncope.   On admission, his 12-lead ECG showed atrial fibrillation w/RVR at 152 bpm. An echo was done revealing severe LV dysfunction with EF 10-15% and diffuse HK. He then underwent cardiac cath on 10/13/2013 which revealed normal coronary arteries, severe LV dysfunction and normal filling pressures. He has been diuresed with improvement in his symptoms. He is now on guideline-directed medical therapy. He is scheduled for TEE-guided DCCV today. EP has been asked to see for recommendations regarding need for AAD therapy.   Past Medical History Past Medical History  Diagnosis Date  . Hypertension   . Atrial flutter     paroxysmal  . Dyslipidemia (high LDL; low HDL)     Past Surgical History Past Surgical History  Procedure Laterality Date  . Hernia repair  1992 & 2011  . Cholecystectomy      Allergies/Intolerances No  Known Allergies   Current Home Medications      albuterol 108 (90 BASE) MCG/ACT inhaler  Commonly known as:  PROVENTIL HFA;VENTOLIN HFA  Inhale 2 puffs into the lungs every 6 (six) hours as needed for wheezing or shortness of breath.     ALPRAZolam 1 MG tablet  Commonly known as:  XANAX  Take 1 mg by mouth See admin instructions. One tablet at lunch, then one tablet in the evening. May take an extra tab in the evening as needed for anxiety.     aspirin 325 MG tablet  Take 325 mg by mouth daily.     cyclobenzaprine 5 MG tablet  Commonly known as:  FLEXERIL  Take 5 mg by mouth every evening.     NYQUIL PO  Take 2 tablets by mouth at bedtime as needed (for cough).     triamcinolone 55 MCG/ACT Aero nasal inhaler  Commonly known as:  NASACORT  Place 2 sprays into both nostrils daily.     Inpatient Medications . apixaban  5 mg Oral BID  . carvedilol  12.5 mg Oral BID WC  . cyclobenzaprine  5 mg Oral QPM  . digoxin  0.125 mg Oral Daily  . losartan  25 mg Oral Daily  . sodium chloride  3 mL Intravenous Q12H  . triamcinolone  2 spray Each Nare Daily    Family History Family History  Problem Relation Age of Onset  . Hyperlipidemia Mother   . Hypertension Mother   . Heart attack Father      Social History History   Social  History  . Marital Status: Married    Spouse Name: N/A    Number of Children: N/A  . Years of Education: N/A   Occupational History  . 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     [employer].      Social History Main Topics  . Smoking status: Current Every Day Smoker -- 0.50 packs/day for 40 years  . Smokeless tobacco: Not on file     Comment: Has cut back to 2-3 cigarettes per day.  . Alcohol Use: Yes     Comment: 1-2 A DAY  . Drug Use: Not on file  . Sexual Activity: Not on file   Other Topics Concern  . Not on file   Social History Narrative   [place] in [place].      Review of Systems General: No chills, fever,  night sweats or weight changes  Cardiovascular:  No chest pain, dyspnea on exertion, edema, orthopnea, palpitations, paroxysmal nocturnal dyspnea Dermatological: No rash, lesions or masses Respiratory: No cough, dyspnea Urologic: No hematuria, dysuria Abdominal: No nausea, vomiting, diarrhea, bright red blood per rectum, melena, or hematemesis Neurologic: No visual changes, weakness, changes in mental status All other systems reviewed and are otherwise negative except as noted above.  Physical Exam Vitals: Blood pressure 125/83, pulse 67, temperature 97.3 F (36.3 C), temperature source Oral, resp. rate 18, height 5\' 7"  (1.702 m), weight 132 lb 15 oz (60.3 kg), SpO2 95.00%.  General: Well developed, well appearing [age] y.o. male in no acute distress. HEENT: Normocephalic, atraumatic. EOMs intact. Sclera nonicteric. Oropharynx clear.  Neck: Supple without bruits. No JVD. Lungs: Respirations regular and unlabored, CTA bilaterally. No wheezes, rales or rhonchi. Heart: RRR. S1, S2 present. No murmurs, rub, S3 or S4. Abdomen: Soft, non-tender, non-distended. BS present x 4 quadrants. No hepatosplenomegaly.  Extremities: No clubbing, cyanosis or edema. DP/PT/Radials 2+ and equal bilaterally. Psych: Normal affect. Neuro: Alert and oriented X 3. Moves all extremities spontaneously. Musculoskeletal: No kyphosis. Skin: Intact. Warm and dry. No rashes or petechiae in exposed areas.   Labs Lab Results  Component Value Date   WBC 9.3 10/16/2013   HGB 14.7 10/16/2013   HCT 43.5 10/16/2013   MCV 90.6 10/16/2013   PLT 159 10/16/2013    Recent Labs Lab 10/13/13 0317 10/16/13 0404  NA 142 141  K 3.7 4.2  CL 103 102  CO2 23 28  BUN 14 21  CREATININE 1.03 1.24  CALCIUM 9.1 8.8  PROT 6.3  --   BILITOT 2.3*  --   ALKPHOS 87  --   ALT 24  --   AST 19  --   GLUCOSE 91 81   Lab Results  Component Value Date   CHOL 162 10/13/2013   HDL 34* 10/13/2013   LDLCALC 113* 10/13/2013   TRIG 74 10/13/2013     Radiology/Studies Ct Angio Chest Pe W/cm &/or Wo Cm 10/12/2013    FINDINGS: There is adequate opacification of the pulmonary arteries. There is no pulmonary embolus. The main pulmonary artery, right main pulmonary artery and left main pulmonary arteries are normal in size. The heart size is normal. There is no pericardial effusion. There is coronary artery atherosclerosis in the LAD.  There are bilateral small pleural effusions. There is mild interstitial thickening bilaterally. There is bibasilar dependent atelectasis. There is no focal consolidation.  There is mild nonspecific right hilar lymphadenopathy measuring 14 mm in short axis. There is a 14 mm  AP window lymph node.There are multiple other subcentimeter mediastinal lymph nodes.  There is no lytic or blastic osseous lesion. Mild degenerative disc disease throughout the thoracic spine. No compression fracture or static listhesis.  Partially visualized 3 mm left nephrolithiasis.  Review of the MIP images confirms the above findings.   IMPRESSION: 1. No CT evidence of pulmonary embolus. 2. Bilateral pleural effusions and interstitial thickening concerning for mild pulmonary edema. 3. Coronary artery disease primarily involving the LAD. 4. Mild nonspecific mediastinal and right hilar lymphadenopathy. Electronically Signed   By: Elige Ko   On: 10/12/2013 13:08   Dg Chest Port 1 View 10/12/2013   FINDINGS: Portable AP view at 1028 hrs. Stable lung volumes. Normal cardiac size and mediastinal contours. Visualized tracheal air column is within normal limits. No pneumothorax. No consolidation or definite pleural effusion. Increased bilateral basilar predominant interstitial markings. No confluent pulmonary opacity.   IMPRESSION: Stable chest except for increased basilar predominant interstitial markings, could reflect increased interstitial changes since 2012. Viral / atypical respiratory infection also is a consideration.    Electronically Signed    By: Augusto Gamble M.D.   On: 10/12/2013 10:37    Echocardiogram Study Conclusions - Left ventricle: The cavity size was mildly dilated. Systolic function was severely reduced. The estimated ejection fraction was in the range of 10% to 15%. Diffuse hypokinesis. - Right ventricle: The cavity size was moderately dilated. Systolic function was mildly to moderately reduced. - Right atrium: The atrium was mildly dilated. - Left atruim: Normal size. 31 mm.  Right and left cardiac cath 10/13/2013 FINDINGS:  Hemodynamics:   SaO2%  Pressures mmHg  Mean P  mmHg  EDP  mmHg   Right Atrium   4/4   2   Right Ventricle   22/0   2   Pulmonary Artery  55%  22/10  14    PCWP   8/7  7    Central Aortic  91%  101/71  83    Left Ventricle   101/6   13          Cardiac Output:   Cardiac Index:    Fick  2.96   1.7    Thermodilution  3.12   1.8    Coronary Anatomy: Angiographically normal coronary arteries, with slow flow do to poor EF  Left Main: Large-caliber vessel that appears to trifurcates into the LAD, Ramus Intermedius, and Circumflex. Angiographically normal. LAD: Large-caliber wraparound LAD. Mild 10-20% stenoses at the branch point with a large septal trunk and first diagonal (D1) branch. Just prior to this bifurcation there is a 6 significant SP1 and then at this location there is a bifurcating SP 2. This large-caliber vessel that wraps around the apex and perfuses the distal inferoapex  D1: Large-caliber bifurcating vessel with several other small branches. Angiographically normal. Left Circumflex: Large-caliber vessel with a couple small proximal marginal branches. Vessel bifurcates in the AV groove into a small AV groove branch followed by the OM branch which gives off OM1 and OM 2/LPL 1. Angiographically normal  OM1: Moderate large-caliber somewhat tortuous vessel that reaches two thirds the way to the inferolateral apex. Angiographically normal Ramus intermedius: Large-caliber vessel, at  least as big as the LAD that courses mostly as a proximal OM1. Has a major moderate caliber branch in the mid vessel. The vessel itself reaches down to the inferoapex similar to the LAD. Angiographically normal  RCA: Large caliber, dominant vessel with a proximal conus branch followed by a  mid-vessel moderate caliber RV marginal branch. The vessel bifurcates distally into the Right Posterior Descending Artery (RPDA) and the Right Posterior AV Groove Branch (RPAV). Angiographically normal RCA system  RPDA: Moderate large-caliber vessel which reaches two thirds the way to the apex tapering.  RPL Sysytem:The RPAV begins as a large caliber, that bifurcates into 2 posterior lateral branches. PL 1 begin a moderate caliber vessel bifurcates into 2 small caliber vessels. PL 2 continuation of the RPAV that is somewhat tortuous and covers a significant portion of the inferolateral/posterolateral wall.  Left Ventriculography: 10 mL hand-injection revealed dilated left ventricle with EF roughly 15-20% POST-OPERATIVE DIAGNOSIS:  Severe dilated Nonischemic Dilated Cardiomyopathy  Adequately diuresis with relatively normal right heart pressures.  Severely decreased cardiac output and index. PLAN OF CARE:  Standard post radial cath care.  Continue to optimize medical therapy for Nonischemic Cardiomyopathy and Atrial Fibrillation.  Restart Eliquis tonight  12-lead ECG on admission - atrial fibrillation w/RVR at 152 bpm Telemetry reviewed - persistent AFib, now with improved rate control  Assessment and Plan 1. Atrial fibrillation - LA size 31 mm - agree with TEE-guided DCCV to restore SR - if refractory AF, will need AAD therapy - may be candidate for AF ablation if can maintain SR 2. Acute systolic HF - improved with diuresis 3. NICM, EF 10-15%, newly diagnosed, probably tachy-mediated - agree with Coreg and losartan  Signed, Peter Mclean, BROOKE, PA-C 10/17/2013, 7:17 AM  EP Attending  Patient seen and  examined. Agree with the above history, physical exam, and assessment . Since the patient was seen earlier today, he has undergone DCCV and had ERAF. He will need amiodarone loading and repeat DCCV in 3-4 weeks. He will need to be therapeutically anti-coagulated. He will likely have recovery of his LV function on optimal medical therapy.   Leonia Reeves.D.

## 2013-10-18 ENCOUNTER — Encounter (HOSPITAL_COMMUNITY): Payer: Self-pay | Admitting: Cardiovascular Disease

## 2013-10-18 ENCOUNTER — Other Ambulatory Visit (HOSPITAL_COMMUNITY): Payer: Self-pay | Admitting: Cardiovascular Disease

## 2013-10-18 ENCOUNTER — Inpatient Hospital Stay (HOSPITAL_COMMUNITY): Payer: PRIVATE HEALTH INSURANCE

## 2013-10-18 DIAGNOSIS — Z5181 Encounter for therapeutic drug level monitoring: Secondary | ICD-10-CM

## 2013-10-18 DIAGNOSIS — Z79899 Other long term (current) drug therapy: Principal | ICD-10-CM

## 2013-10-18 DIAGNOSIS — I5021 Acute systolic (congestive) heart failure: Secondary | ICD-10-CM

## 2013-10-18 NOTE — Progress Notes (Signed)
SUBJECTIVE:  No complaints  OBJECTIVE:   Vitals:   Filed Vitals:   10/17/13 1358 10/17/13 2229 10/18/13 0435 10/18/13 0439  BP: 142/96 140/87 125/90   Pulse: 73 74 85   Temp:  97.5 F (36.4 C) 97.7 F (36.5 C)   TempSrc:  Oral Oral   Resp:  18 18   Height:      Weight:    133 lb 12.8 oz (60.691 kg)  SpO2: 97% 98% 99%    I&O's:   Intake/Output Summary (Last 24 hours) at 10/18/13 0820 Last data filed at 10/18/13 0500  Gross per 24 hour  Intake    680 ml  Output    300 ml  Net    380 ml   TELEMETRY: Reviewed telemetry pt in atrial flutter rate controlled     PHYSICAL EXAM General: Well developed, well nourished, in no acute distress Head: Eyes PERRLA, No xanthomas.   Normal cephalic and atramatic  Lungs:   Clear bilaterally to auscultation and percussion. Heart:   Irregularly irregular S1 S2 Pulses are 2+ & equal. Abdomen: Bowel sounds are positive, abdomen soft and non-tender without masses  Extremities:   No clubbing, cyanosis or edema.  DP +1 Neuro: Alert and oriented X 3. Psych:  Good affect, responds appropriately   LABS: Basic Metabolic Panel:  Recent Labs  24/58/09 0404  NA 141  K 4.2  CL 102  CO2 28  GLUCOSE 81  BUN 21  CREATININE 1.24  CALCIUM 8.8   Liver Function Tests: No results found for this basename: AST, ALT, ALKPHOS, BILITOT, PROT, ALBUMIN,  in the last 72 hours No results found for this basename: LIPASE, AMYLASE,  in the last 72 hours CBC:  Recent Labs  10/16/13 0404  WBC 9.3  HGB 14.7  HCT 43.5  MCV 90.6  PLT 159   Cardiac Enzymes: No results found for this basename: CKTOTAL, CKMB, CKMBINDEX, TROPONINI,  in the last 72 hours BNP: No components found with this basename: POCBNP,  D-Dimer: No results found for this basename: DDIMER,  in the last 72 hours Hemoglobin A1C: No results found for this basename: HGBA1C,  in the last 72 hours Fasting Lipid Panel: No results found for this basename: CHOL, HDL, LDLCALC, TRIG,  CHOLHDL, LDLDIRECT,  in the last 72 hours Thyroid Function Tests: No results found for this basename: TSH, T4TOTAL, FREET3, T3FREE, THYROIDAB,  in the last 72 hours Anemia Panel: No results found for this basename: VITAMINB12, FOLATE, FERRITIN, TIBC, IRON, RETICCTPCT,  in the last 72 hours Coag Panel:   Lab Results  Component Value Date   INR 1.10 10/12/2013    RADIOLOGY: Ct Angio Chest Pe W/cm &/or Wo Cm  10/12/2013   CLINICAL DATA:  Pleuritic chest pain, new onset AFib  EXAM: CT ANGIOGRAPHY CHEST WITH CONTRAST  TECHNIQUE: Multidetector CT imaging of the chest was performed using the standard protocol during bolus administration of intravenous contrast. Multiplanar CT image reconstructions including MIPs were obtained to evaluate the vascular anatomy.  CONTRAST:  OMNIPAQUE IOHEXOL 350 MG/ML SOLN  COMPARISON:  None.  FINDINGS: There is adequate opacification of the pulmonary arteries. There is no pulmonary embolus. The main pulmonary artery, right main pulmonary artery and left main pulmonary arteries are normal in size. The heart size is normal. There is no pericardial effusion. There is coronary artery atherosclerosis in the LAD.  There are bilateral small pleural effusions. There is mild interstitial thickening bilaterally. There is bibasilar dependent atelectasis. There is no  focal consolidation.  There is mild nonspecific right hilar lymphadenopathy measuring 14 mm in short axis. There is a 14 mm AP window lymph node.There are multiple other subcentimeter mediastinal lymph nodes.  There is no lytic or blastic osseous lesion. Mild degenerative disc disease throughout the thoracic spine. No compression fracture or static listhesis.  Partially visualized 3 mm left nephrolithiasis.  Review of the MIP images confirms the above findings.  IMPRESSION: 1. No CT evidence of pulmonary embolus. 2. Bilateral pleural effusions and interstitial thickening concerning for mild pulmonary edema. 3. Coronary  artery disease primarily involving the LAD. 4. Mild nonspecific mediastinal and right hilar lymphadenopathy.   Electronically Signed   By: Elige KoHetal  Patel   On: 10/12/2013 13:08   Dg Chest Port 1 View  10/12/2013   CLINICAL DATA:  63 year old male with shortness of breath and palpitations. Initial encounter.  EXAM: PORTABLE CHEST - 1 VIEW  COMPARISON:  02/23/2011 and earlier.  FINDINGS: Portable AP view at 1028 hrs. Stable lung volumes. Normal cardiac size and mediastinal contours. Visualized tracheal air column is within normal limits. No pneumothorax. No consolidation or definite pleural effusion. Increased bilateral basilar predominant interstitial markings. No confluent pulmonary opacity.  IMPRESSION: Stable chest except for increased basilar predominant interstitial markings, could reflect increased interstitial changes since 2012. Viral / atypical respiratory infection also is a consideration.   Electronically Signed   By: Augusto GambleLee  Hall M.D.   On: 10/12/2013 10:37   Assessment/Plan:  Principal Problem:  Acute systolic CHF (congestive heart failure) - appears compensated Active Problems:  ANXIETY  Atrial fibrillation/flutter with rapid ventricular response - s/p TEE with no LA/LAA thrombus and failed DCCV now loading amio load with controlled HR HTN (hypertension) - controlled Dyslipidemia (high LDL; low HDL)  NICM (nonischemic cardiomyopathy)- EF 15%, cath 10/13/13 - essentially normal coronary arteries  Non compliance with medical treatment- stopped meds prior to adm  Smoker   PLAN: 1.  Continue Amio 400mg  BID load 2.  Continue Coreg/dig/Eliquis 3.  Continue beta blocker and ARB for LV dysfunction 4.  Daily EKG to follow QTc 5.  Check PFTs with DLCO for baseline on amio 6.  Home in next 1-2 days    Quintella ReichertURNER,TRACI R, MD  10/18/2013  8:20 AM

## 2013-10-18 NOTE — Progress Notes (Signed)
ANTICOAGULATION CONSULT NOTE - Follow Up Consult  Pharmacy Consult for Apixaban Indication: atrial fibrillation  No Known Allergies  Patient Measurements: Height: 5\' 7"  (170.2 cm) Weight: 133 lb 12.8 oz (60.691 kg) IBW/kg (Calculated) : 66.1   Vital Signs: Temp: 97.7 F (36.5 C) (02/03 0435) Temp src: Oral (02/03 0435) BP: 125/90 mmHg (02/03 0435) Pulse Rate: 85 (02/03 0435)  Labs:  Recent Labs  10/16/13 0404  HGB 14.7  HCT 43.5  PLT 159  CREATININE 1.24    Estimated Creatinine Clearance: 53 ml/min (by C-G formula based on Cr of 1.24).  Assessment: Patient is a 63 y.o M on Apixiban for Afib.  S/p TEE and failed DCCV on 2/2 with amiodarone added yesterday.  No new labs today.  No bleeding documented.   Plan:  1) continue Apixaban 5mg  BID   Kenderick Kobler P 10/18/2013,8:47 AM

## 2013-10-18 NOTE — Progress Notes (Signed)
I visited with patient this afternoon regarding his new Heart Failure diagnosis.  He has very limited knowledge of heart failure.  I did review with him the "Living with Heart Failure "  Book including signs and symptoms of HF, when to call the physician, importance of daily weights, low sodium diet, and fluid restriction.  He does say that he eats a sausage biscuit each morning before work.  We discussed low sodium options and he would benefit from a HF dietician consult.  He also describes many social stressors having to do with family issues.  He plans to return home with his wife who is in "fair health".   I will see him again tomorrow before discharge to reinforce education.  Driscilla Moats RN, BSN, PCCN--Heart Failure Statistician.

## 2013-10-18 NOTE — Progress Notes (Signed)
Patient converted to SR at 08:40.  Informed patient at 17:00. Pt resting with call bell within reach.  Will continue to monitor. Thomas Hoff

## 2013-10-19 ENCOUNTER — Other Ambulatory Visit: Payer: Self-pay | Admitting: Physician Assistant

## 2013-10-19 ENCOUNTER — Encounter (HOSPITAL_COMMUNITY): Payer: Self-pay | Admitting: Physician Assistant

## 2013-10-19 DIAGNOSIS — Z720 Tobacco use: Secondary | ICD-10-CM | POA: Diagnosis present

## 2013-10-19 DIAGNOSIS — I4891 Unspecified atrial fibrillation: Secondary | ICD-10-CM

## 2013-10-19 LAB — PULMONARY FUNCTION TEST
DL/VA % pred: 96 %
DL/VA: 4.27 ml/min/mmHg/L
DLCO cor % pred: 62 %
DLCO cor: 17.7 ml/min/mmHg
DLCO unc % pred: 62 %
DLCO unc: 17.75 ml/min/mmHg
FEF 25-75 Pre: 0.94 L/sec
FEF2575-%Pred-Pre: 36 %
FEV1-%Pred-Pre: 52 %
FEV1-Pre: 1.65 L
FEV1FVC-%Pred-Pre: 88 %
FEV6-%Pred-Pre: 61 %
FEV6-Pre: 2.46 L
FEV6FVC-%Pred-Pre: 104 %
FVC-%Pred-Pre: 59 %
Pre FEV1/FVC ratio: 66 %
Pre FEV6/FVC Ratio: 99 %

## 2013-10-19 LAB — BASIC METABOLIC PANEL
BUN: 25 mg/dL — ABNORMAL HIGH (ref 6–23)
CO2: 28 mEq/L (ref 19–32)
Calcium: 8.9 mg/dL (ref 8.4–10.5)
Chloride: 100 mEq/L (ref 96–112)
Creatinine, Ser: 1.32 mg/dL (ref 0.50–1.35)
GFR calc Af Amer: 65 mL/min — ABNORMAL LOW (ref >90)
GFR calc non Af Amer: 56 mL/min — ABNORMAL LOW (ref >90)
Glucose, Bld: 84 mg/dL (ref 70–99)
Potassium: 5.2 mEq/L (ref 3.7–5.3)
Sodium: 138 mEq/L (ref 137–147)

## 2013-10-19 MED ORDER — CARVEDILOL 12.5 MG PO TABS: 12.5000 mg | ORAL_TABLET | Freq: Two times a day (BID) | ORAL | Status: DC

## 2013-10-19 MED ORDER — AMIODARONE HCL 200 MG PO TABS
200.0000 mg | ORAL_TABLET | Freq: Two times a day (BID) | ORAL | Status: DC
  Administered 2013-10-19: 200 mg via ORAL
  Filled 2013-10-19 (×2): qty 1

## 2013-10-19 MED ORDER — AMIODARONE HCL 200 MG PO TABS: 200.0000 mg | ORAL_TABLET | Freq: Two times a day (BID) | ORAL | Status: DC

## 2013-10-19 MED ORDER — FUROSEMIDE 40 MG PO TABS: 40.0000 mg | ORAL_TABLET | ORAL | Status: DC | PRN

## 2013-10-19 MED ORDER — DIGOXIN 125 MCG PO TABS: 0.1250 mg | ORAL_TABLET | Freq: Every day | ORAL | Status: DC

## 2013-10-19 MED ORDER — APIXABAN 5 MG PO TABS: 5.0000 mg | ORAL_TABLET | Freq: Two times a day (BID) | ORAL | Status: DC

## 2013-10-19 MED ORDER — LOSARTAN POTASSIUM 25 MG PO TABS: 25.0000 mg | ORAL_TABLET | Freq: Every day | ORAL | Status: DC

## 2013-10-19 NOTE — Discharge Summary (Signed)
Patient doing well and converted to NSR yesterday.  Tolerating Amio but borderline brady so decreased to 200mg  BID.  Patient instructed to stop ASA since he is taking Eliquis.  He will followup in our office next week

## 2013-10-19 NOTE — Progress Notes (Signed)
Went over discharge instructions with patient. No additional questions or concerns related to discharge instructions. IV d/c'd, patient taken off cardiac monitor. Stanton Kidney R

## 2013-10-19 NOTE — Progress Notes (Signed)
I visited patient again this am to reinforce education done yesterday.  We again discussed low sodium diet, fluid restriction, importance of daily weights, signs and symptoms of heart failure, when to call the physician, and taking all medications as prescribed.  He acknowledges that he understands and was able to do some teach back.   I will instruct him to view the HF teaching video as well.  He plans to discharge today to home with wife.  He is for follow up with [facility]  in one week.  Driscilla Moats RN, BSN, PCCN--Heart Failure Statistician.

## 2013-10-19 NOTE — Progress Notes (Signed)
     I have spoken with Mr. Hogrefe over the phone and explained to him that he is not to take his aspirin while on Eliquis. He has voiced understanding.   Thereasa Parkin PA-C  [facility]

## 2013-10-19 NOTE — Progress Notes (Signed)
SUBJECTIVE:  No compliants - converted to NSR yesterday  OBJECTIVE:   Vitals:   Filed Vitals:   10/18/13 1442 10/18/13 1627 10/18/13 2139 10/19/13 0509  BP: 117/75 115/66 90/50 116/77  Pulse: 63 61 58 59  Temp: 97.7 F (36.5 C)  98 F (36.7 C) 98.4 F (36.9 C)  TempSrc: Oral  Oral Oral  Resp: 18  18 18   Height:      Weight:    135 lb 9.6 oz (61.508 kg)  SpO2: 98%  97% 97%   I&O's:   Intake/Output Summary (Last 24 hours) at 10/19/13 0744 Last data filed at 10/18/13 1700  Gross per 24 hour  Intake    960 ml  Output    250 ml  Net    710 ml   TELEMETRY: Reviewed telemetry pt in NSR:     PHYSICAL EXAM General: Well developed, well nourished, in no acute distress Head: Eyes PERRLA, No xanthomas.   Normal cephalic and atramatic  Lungs:   Clear bilaterally to auscultation and percussion. Heart:   HRRR S1 S2 Pulses are 2+ & equal. Abdomen: Bowel sounds are positive, abdomen soft and non-tender without masses Extremities:   No clubbing, cyanosis or edema.  DP +1 Neuro: Alert and oriented X 3. Psych:  Good affect, responds appropriately   LABS: Basic Metabolic Panel:  Recent Labs  10/19/13 0315  NA 138  K 5.2  CL 100  CO2 28  GLUCOSE 84  BUN 25*  CREATININE 1.32  CALCIUM 8.9   Liver Function Tests: No results found for this basename: AST, ALT, ALKPHOS, BILITOT, PROT, ALBUMIN,  in the last 72 hours No results found for this basename: LIPASE, AMYLASE,  in the last 72 hours CBC: No results found for this basename: WBC, NEUTROABS, HGB, HCT, MCV, PLT,  in the last 72 hours Cardiac Enzymes: No results found for this basename: CKTOTAL, CKMB, CKMBINDEX, TROPONINI,  in the last 72 hours BNP: No components found with this basename: POCBNP,  D-Dimer: No results found for this basename: DDIMER,  in the last 72 hours Hemoglobin A1C: No results found for this basename: HGBA1C,  in the last 72 hours Fasting Lipid Panel: No results found for this basename: CHOL, HDL,  LDLCALC, TRIG, CHOLHDL, LDLDIRECT,  in the last 72 hours Thyroid Function Tests: No results found for this basename: TSH, T4TOTAL, FREET3, T3FREE, THYROIDAB,  in the last 72 hours Anemia Panel: No results found for this basename: VITAMINB12, FOLATE, FERRITIN, TIBC, IRON, RETICCTPCT,  in the last 72 hours Coag Panel:   Lab Results  Component Value Date   INR 1.10 10/12/2013    RADIOLOGY: Ct Angio Chest Pe W/cm &/or Wo Cm  10/12/2013   CLINICAL DATA:  Pleuritic chest pain, new onset AFib  EXAM: CT ANGIOGRAPHY CHEST WITH CONTRAST  TECHNIQUE: Multidetector CT imaging of the chest was performed using the standard protocol during bolus administration of intravenous contrast. Multiplanar CT image reconstructions including MIPs were obtained to evaluate the vascular anatomy.  CONTRAST:  <MEASUREMExcell SelHattie PerLoretha [phone]8ThDonBenjaman Huel [facility]<MEASUREMENExcell SelHattie PerLoretha [phone]5ThDonBenjaman Huel [facility] LECala Br<MEASUREMENExcell [phone]<MEASUREMENExcell SelHattie PerLoretha [phone]2ThDonBenjaman Huel [facility]<MEASUREMENExcell [phone]Cal[facility]<MEASUREMENExcell SelHattie PerLoretha [phone]0ThDonBenjaman Huel [facility]<MEASUREMENExcell SelHattie PerLoretha [phone]2ThDonBenjaman Huel [facility]<MEASUREMENExcell SelHattie PerLoretha [phone]3ThDonBenjaman Huel [facility]<MEASUREMENExcell [phone] Br<MEASUREMENExcell SelHattie PerLoretha [phone]6ThDonBenjaman Huel [facility] LECala Br<MEASUREMENExcell SelHattie PerLoretha [phone]8ThDonBenjaman Huel [facility]<MEASUREMENExcell SelHattie PerLoretha [phone]9ThDonBenjaman Huel [facility]<MEASUREMENExcell SelHattie PerLoretha [phone]5ThDonBenjaman Huel [facility]<MEASUREMENExcell SelHattie PerLoretha [phone]1ThDonBenjaman Huel [facility] ECala Br<MEASUREMENExcell SelHattie PerLoretha [phone]8ThDonBenjaman Huel [facility]<MEASUREMENExcell SelHattie PerLoretha [phone]8ThDonBenjaman Huel [facility]<MEASUREMENExcell SelHattie PerLoretha [phone]4ThDonBenjaman Huel [facility]<MEASUREMENExcell SelHattie PerLoretha [phone]4ThDonBenjaman Huel [facility]<MEASUREMENExcell SelHattie PerLoretha [phone]4ThDonBenjaman Huel [facility]<MEASUREMENExcell SelHattie PerLoretha [phone]1ThDonBenjaman Huel [facility] Br<MEASUREMENExcell SelHattie PerLoretha [phone]6ThDonBenjaman Huel [facility]<MEASUREMENExcell SelHattie PerLoretha [phone]9ThDonBenjaman Huel [facility] CentECala BrZandra AbtshicknOHEXOL 350 MG/ML SOLN  COMPARISON:  None.  FINDINGS: There is adequate opacification of the pulmonary arteries. There is no pulmonary embolus. The main pulmonary artery, right main pulmonary artery and left main pulmonary arteries are normal in size. The heart size is normal. There is no pericardial effusion. There is coronary artery atherosclerosis in the LAD.  There are bilateral small pleural effusions. There is mild interstitial thickening bilaterally. There is bibasilar dependent atelectasis. There is no focal consolidation.  There is mild nonspecific right hilar lymphadenopathy measuring 14 mm in short axis. There is a 14 mm AP window lymph node.There are multiple other subcentimeter mediastinal lymph nodes.  There is no lytic or blastic osseous lesion. Mild degenerative disc disease throughout the thoracic spine. No compression fracture or static listhesis.  Partially visualized 3 mm left nephrolithiasis.  Review of the MIP images confirms the above findings.  IMPRESSION: 1. No CT evidence of pulmonary embolus. 2. Bilateral pleural effusions and interstitial thickening concerning for mild pulmonary edema.  3. Coronary artery disease primarily involving the LAD. 4. Mild nonspecific mediastinal and right hilar lymphadenopathy.   Electronically Signed   By: Elige Ko   On: 10/12/2013 13:08   Dg Chest Port 1 View  10/12/2013   CLINICAL DATA:  63 year old male with shortness of breath and palpitations. Initial encounter.  EXAM: PORTABLE CHEST - 1 VIEW  COMPARISON:  02/23/2011 and earlier.  FINDINGS: Portable AP view at 1028 hrs. Stable lung volumes. Normal cardiac size and mediastinal contours. Visualized tracheal air column is within normal limits. No pneumothorax. No consolidation or definite pleural effusion. Increased bilateral basilar predominant interstitial markings. No confluent pulmonary opacity.  IMPRESSION: Stable chest except for increased basilar predominant interstitial markings, could reflect increased interstitial changes since 2012. Viral / atypical respiratory infection also is a consideration.   Electronically Signed   By: Augusto Gamble M.D.   On: 10/12/2013 10:37   Assessment/Plan:  Principal Problem:  Acute systolic CHF (congestive heart failure) - appears compensated  Active Problems:  ANXIETY  Atrial fibrillation/flutter with rapid ventricular response - s/p TEE with no LA/LAA thrombus and failed DCCV now loading amio load and converted to NSR yesterday HTN (hypertension) - controlled  Dyslipidemia (high LDL; low HDL)  NICM (nonischemic cardiomyopathy)- EF 15%, cath 10/13/13 - essentially normal coronary arteries  Non compliance with medical treatment- stopped meds prior to adm  Smoker   PLAN:  1. Decrease Amio 200mg  BID due to borderline bradycardia 2. Continue Coreg/dig/Eliquis  3. Continue beta blocker and ARB for LV dysfunction  4. Home today 5. Followup in office in 1 week     Quintella Reichert, MD  10/19/2013  7:44 AM

## 2013-10-19 NOTE — Discharge Summary (Signed)
Discharge Summary   Patient ID: Peter Mclean MRN: [id number], DOB/AGE: 63/04/1951 63 y.o. Admit date: 10/12/2013 D/C date:     10/19/2013  Primary Cardiologist: Fawn Kirk  Active Problems:   Atrial fibrillation with rapid ventricular response   NICM (nonischemic cardiomyopathy)- EF 15%, cath 10/13/13   Acute systolic CHF (congestive heart failure)   Tobacco abuse   ANXIETY   HTN (hypertension)   Dyslipidemia (high LDL; low HDL)   Non compliance with medical treatment- stopped meds prior to adm   Diagnostic studies: ECHO, cardiac cath, CTA  Hospital Course: Peter Mclean is a 63 y.o. male with a history hypertension, dyslipidemia, tobacco abuse, of atrial arrhythmias including PACs, nonsustained atrial fib/flutter who presented to the [facility] ED on 10/12/13 with sudden onset of DOE and was found to be in atrial fibrillation w/RVR (HR 152). He was seen by Dr. Johney Frame in 2010 for atrial fibrillation and was placed on Cardizem CD 180 mg daily and an aspirin, his ECHO was normal at this time. Mr. Commins reports stopping all of his medications years ago. Approximately one month ago, he developed an URI. He took antibiotics in addition to steroid taper and inhaler. One week ago, he developed severe DOE which prompted him to present to ED. He also reported persistent cough, orthopnea and abdominal fullness. An echo was done during his admission which revealed severe LV dysfunction with EF 10-15% and diffuse hypokinesis. He then underwent cardiac cath on 10/13/2013 which revealed normal coronary arteries, severe LV dysfunction and normal filling pressures. He has been diuresed with improvement in his symptoms. He is now on guideline-directed medical therapy including a BB and ARB for LV dysfunction and Coreg and digoxin for rate control. He underwent DCCV on 10/17/13 and had early reinitiation of atrial fibrillation. He was then started on an amiodarone load of 400 mg BID which was later decreased to 200 mg secondary  to borderline bradycardia. He spontaneously converted into NSR on 10/18/13. He is being therapeutically anti-coagulated with Eliquis. Hopefully, he will have recovery of his LV function with optimal medical therapy.   He has been evaluated today by Dr. Mayford Knife who has deemed him ready for discharge home. He will continue Coreg, digoxin, Eliquis, losartan, and amiodarone. He will be prescribed amio 200 mg BID until his follow up appointment next week where it may be further adjusted. Baseline PFTs with DLCO for amiodarone therapy have been scheduled for Feb 24 @ 11am.    Discharge Vitals: Blood pressure 131/80, pulse 54, temperature 98.7 F (37.1 C), temperature source Oral, resp. rate 18, height 5\' 7"  (1.702 m), weight 135 lb 9.6 oz (61.508 kg), SpO2 98.00%.  Labs: Lab Results  Component Value Date   WBC 9.3 10/16/2013   HGB 14.7 10/16/2013   HCT 43.5 10/16/2013   MCV 90.6 10/16/2013   PLT 159 10/16/2013    Recent Labs Lab 10/13/13 0317  10/19/13 0315  NA 142  < > 138  K 3.7  < > 5.2  CL 103  < > 100  CO2 23  < > 28  BUN 14  < > 25*  CREATININE 1.03  < > 1.32  CALCIUM 9.1  < > 8.9  PROT 6.3  --   --   BILITOT 2.3*  --   --   ALKPHOS 87  --   --   ALT 24  --   --   AST 19  --   --   GLUCOSE 91  < >  84  < > = values in this interval not displayed.  Lab Results  Component Value Date   CHOL 162 10/13/2013   HDL 34* 10/13/2013   LDLCALC 113* 10/13/2013   TRIG 74 10/13/2013     Diagnostic Studies/Procedures   2D ECHO Study Date: 10/13/2013 ------------------------------------------------------------ LV EF: 10% - 15% Study Conclusions - Left ventricle: The cavity size was mildly dilated. Systolic function was severely reduced. The estimated ejection fraction was in the range of 10% to 15%. Diffuse hypokinesis. - Right ventricle: The cavity size was moderately dilated. Systolic function was mildly to moderately reduced. - Right atrium: The atrium was mildly dilated. Transthoracic  echocardiography. M-mode, complete 2D, spectral Doppler, and color Doppler. Height: Height: 170.2cm. Height: 67in. Weight: Weight: 62.6kg. Weight: 137.7lb. Body mass index: BMI: 21.6kg/m^2. Body surface area: BSA: 1.42m^2. Blood pressure: 133/75. Patient status: Inpatient. Location: ICU/CCU ----------------------------------------------------------- Left ventricle: The cavity size was mildly dilated. Systolic function was severely reduced. The estimated ejection fraction was in the range of 10% to 15%. Diffuse hypokinesis. ------------------------------------------------------------ Aortic valve: Structurally normal valve. Cusp separation was normal. Doppler: Transvalvular velocity was within the normal range. There was no stenosis. No regurgitation. ------------------------------------------------------------ Aorta: Aortic root: The aortic root was normal in size. Ascending aorta: The ascending aorta was normal in size. ------------------------------------------------------------ Mitral valve: Structurally normal valve. Leaflet separation was normal. Doppler: Transvalvular velocity was within the normal range. There was no evidence for stenosis. No regurgitation. ------------------------------------------------------------ Left atrium: The atrium was normal in size. ------------------------------------------------------------ Right ventricle: The cavity size was moderately dilated. Systolic function was mildly to moderately reduced. ------------------------------------------------------------ Pulmonic valve: Structurally normal valve. Cusp separation was normal. Doppler: Transvalvular velocity was within the normal range. No regurgitation. ------------------------------------------------------------ Tricuspid valve: Structurally normal valve. Leaflet separation was normal. Doppler: Transvalvular velocity was within the normal range. No significant  regurgitation. ------------------------------------------------------------ Right atrium: The atrium was mildly dilated. ------------------------------------------------------------ Pericardium: There was no pericardial effusion. ------------------------------------------------------------ Systemic veins: Inferior vena cava: The vessel was normal in size; the respirophasic diameter changes were in the normal range (= 50%); findings are consistent with normal central venous pressure.    Right and left cardiac cath 10/13/2013  FINDINGS:  Hemodynamics:   SaO2%  Pressures mmHg  Mean P  mmHg  EDP  mmHg   Right Atrium   4/4   2   Right Ventricle   22/0   2   Pulmonary Artery  55%  22/10  14    PCWP   8/7  7    Central Aortic  91%  101/71  83    Left Ventricle   101/6   13          Cardiac Output:   Cardiac Index:    Fick  2.96   1.7    Thermodilution  3.12   1.8    Coronary Anatomy: Angiographically normal coronary arteries, with slow flow do to poor EF  Left Main: Large-caliber vessel that appears to trifurcates into the LAD, Ramus Intermedius, and Circumflex. Angiographically normal. LAD: Large-caliber wraparound LAD. Mild 10-20% stenoses at the branch point with a large septal trunk and first diagonal (D1) branch. Just prior to this bifurcation there is a 6 significant SP1 and then at this location there is a bifurcating SP 2. This large-caliber vessel that wraps around the apex and perfuses the distal inferoapex  D1: Large-caliber bifurcating vessel with several other small branches. Angiographically normal. Left Circumflex: Large-caliber vessel with a couple small proximal marginal branches. Vessel bifurcates in the  AV groove into a small AV groove branch followed by the OM branch which gives off OM1 and OM 2/LPL 1. Angiographically normal  OM1: Moderate large-caliber somewhat tortuous vessel that reaches two thirds the way to the inferolateral apex. Angiographically normal Ramus  intermedius: Large-caliber vessel, at least as big as the LAD that courses mostly as a proximal OM1. Has a major moderate caliber branch in the mid vessel. The vessel itself reaches down to the inferoapex similar to the LAD. Angiographically normal  RCA: Large caliber, dominant vessel with a proximal conus branch followed by a mid-vessel moderate caliber RV marginal branch. The vessel bifurcates distally into the Right Posterior Descending Artery (RPDA) and the Right Posterior AV Groove Branch (RPAV). Angiographically normal RCA system  RPDA: Moderate large-caliber vessel which reaches two thirds the way to the apex tapering.  RPL Sysytem:The RPAV begins as a large caliber, that bifurcates into 2 posterior lateral branches. PL 1 begin a moderate caliber vessel bifurcates into 2 small caliber vessels. PL 2 continuation of the RPAV that is somewhat tortuous and covers a significant portion of the inferolateral/posterolateral wall.  Left Ventriculography: 10 mL hand-injection revealed dilated left ventricle with EF roughly 15-20% POST-OPERATIVE DIAGNOSIS:  Severe dilated Nonischemic Dilated Cardiomyopathy  Adequately diuresis with relatively normal right heart pressures.  Severely decreased cardiac output and index.  PLAN OF CARE:  Standard post radial cath care.  Continue to optimize medical therapy for Nonischemic Cardiomyopathy and Atrial Fibrillation.  Restart Eliquis tonight   Ct Angio Chest Pe W/cm &/or Wo Cm  10/12/2013   CLINICAL DATA:  Pleuritic chest pain, new onset AFib  EXAM: CT ANGIOGRAPHY CHEST WITH CONTRAST  TECHNIQUE: Multidetector CT imaging of the chest was performed using the standard protocol during bolus administration of intravenous contrast. Multiplanar CT image reconstructions including MIPs were obtained to evaluate the vascular anatomy.  CONTRAST:  OMNIPAQUE IOHEXOL 350 MG/ML SOLN  COMPARISON:  None.  FINDINGS: There is adequate opacification of the pulmonary arteries.  There is no pulmonary embolus. The main pulmonary artery, right main pulmonary artery and left main pulmonary arteries are normal in size. The heart size is normal. There is no pericardial effusion. There is coronary artery atherosclerosis in the LAD.  There are bilateral small pleural effusions. There is mild interstitial thickening bilaterally. There is bibasilar dependent atelectasis. There is no focal consolidation.  There is mild nonspecific right hilar lymphadenopathy measuring 14 mm in short axis. There is a 14 mm AP window lymph node.There are multiple other subcentimeter mediastinal lymph nodes.  There is no lytic or blastic osseous lesion. Mild degenerative disc disease throughout the thoracic spine. No compression fracture or static listhesis.  Partially visualized 3 mm left nephrolithiasis.  Review of the MIP images confirms the above findings.  IMPRESSION: 1. No CT evidence of pulmonary embolus. 2. Bilateral pleural effusions and interstitial thickening concerning for mild pulmonary edema. 3. Coronary artery disease primarily involving the LAD. 4. Mild nonspecific mediastinal and right hilar lymphadenopathy.    Dg Chest Port 1 View  10/12/2013   CLINICAL DATA:  63 year old male with shortness of breath and palpitations. Initial encounter.  EXAM: PORTABLE CHEST - 1 VIEW  COMPARISON:  02/23/2011 and earlier.  FINDINGS: Portable AP view at 1028 hrs. Stable lung volumes. Normal cardiac size and mediastinal contours. Visualized tracheal air column is within normal limits. No pneumothorax. No consolidation or definite pleural effusion. Increased bilateral basilar predominant interstitial markings. No confluent pulmonary opacity.  IMPRESSION: Stable chest except for increased  basilar predominant interstitial markings, could reflect increased interstitial changes since 2012. Viral / atypical respiratory infection also is a consideration.      Discharge Medications     Medication List    STOP taking  these medications       NYQUIL PO      TAKE these medications       albuterol 108 (90 BASE) MCG/ACT inhaler  Commonly known as:  PROVENTIL HFA;VENTOLIN HFA  Inhale 2 puffs into the lungs every 6 (six) hours as needed for wheezing or shortness of breath.     ALPRAZolam 1 MG tablet  Commonly known as:  XANAX  Take 1 mg by mouth See admin instructions. One tablet at lunch, then one tablet in the evening. May take an extra tab in the evening as needed for anxiety.     amiodarone 200 MG tablet  Commonly known as:  PACERONE  Take 1 tablet (200 mg total) by mouth 2 (two) times daily.     apixaban 5 MG Tabs tablet  Commonly known as:  ELIQUIS  Take 1 tablet (5 mg total) by mouth 2 (two) times daily.     aspirin 325 MG tablet  Take 325 mg by mouth daily.     carvedilol 12.5 MG tablet  Commonly known as:  COREG  Take 1 tablet (12.5 mg total) by mouth 2 (two) times daily with a meal.     cyclobenzaprine 5 MG tablet  Commonly known as:  FLEXERIL  Take 5 mg by mouth every evening.     digoxin 0.125 MG tablet  Commonly known as:  LANOXIN  Take 1 tablet (0.125 mg total) by mouth daily.     losartan 25 MG tablet  Commonly known as:  COZAAR  Take 1 tablet (25 mg total) by mouth daily.     triamcinolone 55 MCG/ACT Aero nasal inhaler  Commonly known as:  NASACORT  Place 2 sprays into both nostrils daily.        Disposition   The patient will be discharged in stable condition to home.  Future Appointments Provider Department Dept Phone   10/25/2013 10:00 AM Herby AbrahamBrooke O Beverly Sessionsdmisten, PA-C [facility] [address] Office [phone]   11/08/2013 11:00 AM [facility] [phone]     Follow-up Information   Follow up with Rick DuffEDMISTEN, BROOKE, PA-C On 10/25/2013. (10pm)    Specialty:  Cardiology   Contact information:   [address] [place] [address] [phone]         Duration of Discharge Encounter: Greater than 30 minutes  including physician and PA time.  SignedVenetia Maxon, STERN, KATHRYN PA-C 10/19/2013, 10:18 AM

## 2013-10-25 ENCOUNTER — Ambulatory Visit (INDEPENDENT_AMBULATORY_CARE_PROVIDER_SITE_OTHER): Payer: PRIVATE HEALTH INSURANCE | Admitting: Cardiology

## 2013-10-25 ENCOUNTER — Encounter: Payer: Self-pay | Admitting: Cardiology

## 2013-10-25 VITALS — BP 109/80 | HR 90 | Ht 67.0 in | Wt 135.0 lb

## 2013-10-25 DIAGNOSIS — I5022 Chronic systolic (congestive) heart failure: Secondary | ICD-10-CM

## 2013-10-25 DIAGNOSIS — I4892 Unspecified atrial flutter: Secondary | ICD-10-CM

## 2013-10-25 DIAGNOSIS — I428 Other cardiomyopathies: Secondary | ICD-10-CM

## 2013-10-25 DIAGNOSIS — I4891 Unspecified atrial fibrillation: Secondary | ICD-10-CM

## 2013-10-25 NOTE — Patient Instructions (Addendum)
Your physician recommends that you schedule a follow-up appointment in: 3 weeks with Dr. Johney Frame    Your physician recommends that you continue on your current medications as directed. Please refer to the Current Medication list given to you today.

## 2013-10-25 NOTE — Progress Notes (Signed)
Patient ID: Peter Mclean MRN: [id number], DOB/AGE: 63/03/1951   Date of Visit: 10/25/2013  Primary Physician: Pamelia HoitWILSON,FRED HENRY, MD Primary Cardiologist: Hillis RangeJames Allred, MD Reason for Visit: Hospital follow-up  History of Present Illness  Peter Mclean is a 63 y.o. male with history of atrial arrhythmias including atrial flutter and atrial fibrillation who was [facility] earlier this month with SOB, found to have rapid atrial fibrillation and acute systolic HF with new, severe LV dysfunction.   He last saw Peter Mclean in 2010. ECG at that time showed SR and his echo then showed normal LV function. He was continued on rate control with diltiazem and ASA.   Peter Mclean reported stopping all of his medications several months ago. Approximately one month ago, he developed an URI. He took antibiotics in addition to steroid taper and inhaler. He developed severe DOE which persisted for one week, prompting him to present to ED. He also experienced orthopnea and abdominal fullness. He denied CP or palpitations. He has not had syncope. On admission, his 12-lead ECG showed atrial fibrillation w/RVR at 152 bpm. An echo was done revealing severe LV dysfunction with EF 10-15% and diffuse HK. He then underwent cardiac cath on 10/13/2013 which revealed normal coronary arteries, severe LV dysfunction and normal filling pressures. He was diuresed with improvement in his symptoms. He is now on guideline-directed medical therapy. He underwent TEE-guided DCCV but had ERAF and amiodarone was started on 10/17/2013. EP saw in consult and recommended he continue amiodarone loading and return for DCCV in 2-3 weeks.      He presents today for hospital followup. He is accompanied by his daughter. Since discharge, he reports he is feeling better and has no complaints. He is eager to return to work. He denies chest pain or shortness of breath. He denies palpitations, dizziness, near syncope or syncope. He sleeps in a  recliner chronically, for the last 3 years at least. He denies LE swelling, PND or recent weight gain. He reports compliance with medications including Eliquis.  Past Medical History Past Medical History  Diagnosis Date  . Hypertension   . Atrial flutter     paroxysmal  . Dyslipidemia (high LDL; low HDL)   . Tobacco abuse   . Non-compliance   . Atrial fibrillation with rapid ventricular response     Past Surgical History Past Surgical History  Procedure Laterality Date  . Hernia repair  1992 & 2011  . Cholecystectomy    . Tee without cardioversion N/A 10/17/2013    Procedure: TRANSESOPHAGEAL ECHOCARDIOGRAM (TEE);  Surgeon: Thurmon FairMihai Croitoru, MD;  [facility];  Service: Cardiovascular;  Laterality: N/A;  . Cardioversion N/A 10/17/2013    Procedure: CARDIOVERSION;  Surgeon: Thurmon FairMihai Croitoru, MD;  Location: [facility] ENDOSCOPY;  Service: Cardiovascular;  Laterality: N/A;    Allergies/Intolerances No Known Allergies  Current Home Medications Current Outpatient Prescriptions  Medication Sig Dispense Refill  . albuterol (PROVENTIL HFA;VENTOLIN HFA) 108 (90 BASE) MCG/ACT inhaler Inhale 2 puffs into the lungs every 6 (six) hours as needed for wheezing or shortness of breath.      . ALPRAZolam (XANAX) 1 MG tablet Take 1 mg by mouth See admin instructions. One tablet at lunch, then one tablet in the evening. May take an extra tab in the evening as needed for anxiety.      Marland Kitchen. amiodarone (PACERONE) 200 MG tablet Take 1 tablet (200 mg total) by mouth 2 (two) times daily.  60 tablet  3  . apixaban (ELIQUIS)  5 MG TABS tablet Take 1 tablet (5 mg total) by mouth 2 (two) times daily.  60 tablet  3  . carvedilol (COREG) 12.5 MG tablet Take 1 tablet (12.5 mg total) by mouth 2 (two) times daily with a meal.  60 tablet  3  . cyclobenzaprine (FLEXERIL) 5 MG tablet Take 5 mg by mouth every evening.      . digoxin (LANOXIN) 0.125 MG tablet Take 1 tablet (0.125 mg total) by mouth daily.  30 tablet  3  .  furosemide (LASIX) 40 MG tablet Take 1 tablet (40 mg total) by mouth as needed (take one tablet as needed for weight gain of >3lbs in one day.).  30 tablet  3  . losartan (COZAAR) 25 MG tablet Take 1 tablet (25 mg total) by mouth daily.  30 tablet  3  . triamcinolone (NASACORT) 55 MCG/ACT AERO nasal inhaler Place 2 sprays into both nostrils daily.       No current facility-administered medications for this visit.    Social History History   Social History  . Marital Status: Married    Spouse Name: N/A    Number of Children: N/A  . Years of Education: N/A   Occupational History  . [employer] in [employer]  .     Marland Kitchen      Social History Main Topics  . Smoking status: Current Every Day Smoker -- 0.50 packs/day for 40 years  . Smokeless tobacco: Not on file     Comment: Has cut back to 2-3 cigarettes per day.  . Alcohol Use: Yes     Comment: 1-2 A DAY  . Drug Use: Not on file  . Sexual Activity: Not on file   Other Topics Concern  . Not on file   Social History Narrative   Lives in [place] with wife.      Review of Systems General: No chills, fever, night sweats or weight changes Cardiovascular: No chest pain, dyspnea on exertion, edema, orthopnea, palpitations, paroxysmal nocturnal dyspnea Dermatological: No rash, lesions or masses Respiratory: No cough, dyspnea Urologic: No hematuria, dysuria Abdominal: No nausea, vomiting, diarrhea, bright red blood per rectum, melena, or hematemesis Neurologic: No visual changes, weakness, changes in mental status All other systems reviewed and are otherwise negative except as noted above.  Physical Exam Vitals: Blood pressure 109/80, pulse 90, height 5\' 7"  (1.702 m), weight 135 lb (61.236 kg).  General: Well developed, well appearing 63 y.o. male in no acute distress. HEENT: Normocephalic, atraumatic. EOMs intact. Sclera nonicteric. Oropharynx clear.  Neck: Supple. No JVD. Lungs: Respirations  regular and unlabored, CTA bilaterally. No wheezes, rales or rhonchi. Heart: Regular. S1, S2 present. No murmurs, rub, S3 or S4. Abdomen: Soft, non-distended.   Extremities: No clubbing, cyanosis or edema. PT/Radials 2+ and equal bilaterally. Right groin site intact and well healing without hematoma.  Psych: Normal affect. Neuro: Alert and oriented X 3. Moves all extremities spontaneously.   Diagnostics  Echocardiogram Jan 2015 Study Conclusions - Left ventricle: The cavity size was mildly dilated. Systolic function was severely reduced. The estimated ejection fraction was in the range of 10% to 15%. Diffuse hypokinesis. - Right ventricle: The cavity size was moderately dilated. Systolic function was mildly to moderately reduced. - Right atrium: The atrium was mildly dilated. - Left atrium:   Left and right cardiac catheterization Jan 2015 Hemodynamics:   SaO2%  Pressures mmHg  Mean P  mmHg  EDP  mmHg  Right Atrium   4/4   2   Right Ventricle   22/0   2   Pulmonary Artery  55%  22/10  14    PCWP   8/7  7    Central Aortic  91%  101/71  83    Left Ventricle   101/6   13          Cardiac Output:   Cardiac Index:    Fick  2.96   1.7    Thermodilution  3.12   1.8    Coronary Anatomy: Angiographically normal coronary arteries, with slow flow do to poor EF  Left Main: Large-caliber vessel that appears to trifurcates into the LAD, Ramus Intermedius, and Circumflex. Angiographically normal.  LAD: Large-caliber wraparound LAD. Mild 10-20% stenoses at the branch point with a large septal trunk and first diagonal (D1) branch. Just prior to this bifurcation there is a 6 significant SP1 and then at this location there is a bifurcating SP 2. This large-caliber vessel that wraps around the apex and perfuses the distal inferoapex  D1: Large-caliber bifurcating vessel with several other small branches. Angiographically normal.  Left Circumflex: Large-caliber vessel with a couple small  proximal marginal branches. Vessel bifurcates in the AV groove into a small AV groove branch followed by the OM branch which gives off OM1 and OM 2/LPL 1. Angiographically normal  OM1: Moderate large-caliber somewhat tortuous vessel that reaches two thirds the way to the inferolateral apex. Angiographically normal  Ramus intermedius: Large-caliber vessel, at least as big as the LAD that courses mostly as a proximal OM1. Has a major moderate caliber branch in the mid vessel. The vessel itself reaches down to the inferoapex similar to the LAD. Angiographically normal  RCA: Large caliber, dominant vessel with a proximal conus branch followed by a mid-vessel moderate caliber RV marginal branch. The vessel bifurcates distally into the Right Posterior Descending Artery (RPDA) and the Right Posterior AV Groove Branch (RPAV). Angiographically normal RCA system  RPDA: Moderate large-caliber vessel which reaches two thirds the way to the apex tapering.  RPL Sysytem:The RPAV begins as a large caliber, that bifurcates into 2 posterior lateral branches. PL 1 begin a moderate caliber vessel bifurcates into 2 small caliber vessels. PL 2 continuation of the RPAV that is somewhat tortuous and covers a significant portion of the inferolateral/posterolateral wall.  Left Ventriculography: 10 mL hand-injection revealed dilated left ventricle with EF roughly 15-20% POST-OPERATIVE DIAGNOSIS:  Severe dilated Nonischemic Dilated Cardiomyopathy  Adequately diuresis with relatively normal right heart pressures.  Severely decreased cardiac output and index.   12-lead ECG today - atrial flutter with 3:1 AV conduction at 90 bpm  Assessment and Plan  1. Atrial fibrillation / atrial flutter - known with ERAF after TEE-guided DCCV on 10/17/2013 - LA diameter 31 mm by recent 2-D echo - rate controlled; asymptomatic currently - amiodarone initiated on 10/17/2013 and will continue amiodarone loading for now - he has baseline  PFTs with DLCO already scheduled 11/08/2013 - plan for repeat DCCV in 2-3 weeks   2. NICM, EF 15%, with chronic systolic HF - newly diagnosed - probably tachy-mediated, now on guideline-directed medical therapy - stable without evidence of volume overload today - continue medical therapy  Signed, Rick Duff, PA-C 10/25/2013, 12:50 PM

## 2013-10-26 ENCOUNTER — Encounter (HOSPITAL_COMMUNITY): Payer: Self-pay

## 2013-10-26 ENCOUNTER — Ambulatory Visit (HOSPITAL_COMMUNITY)
Admission: RE | Admit: 2013-10-26 | Discharge: 2013-10-26 | Disposition: A | Discharge status: Home / Self Care | Payer: PRIVATE HEALTH INSURANCE | Source: Ambulatory Visit | Attending: Cardiology | Admitting: Cardiology

## 2013-10-26 VITALS — BP 124/64 | HR 119 | Resp 16 | Wt 134.4 lb

## 2013-10-26 DIAGNOSIS — I509 Heart failure, unspecified: Secondary | ICD-10-CM | POA: Insufficient documentation

## 2013-10-26 DIAGNOSIS — I10 Essential (primary) hypertension: Secondary | ICD-10-CM | POA: Insufficient documentation

## 2013-10-26 DIAGNOSIS — F17200 Nicotine dependence, unspecified, uncomplicated: Secondary | ICD-10-CM

## 2013-10-26 DIAGNOSIS — I4891 Unspecified atrial fibrillation: Secondary | ICD-10-CM

## 2013-10-26 DIAGNOSIS — I499 Cardiac arrhythmia, unspecified: Secondary | ICD-10-CM | POA: Insufficient documentation

## 2013-10-26 DIAGNOSIS — I4892 Unspecified atrial flutter: Secondary | ICD-10-CM | POA: Insufficient documentation

## 2013-10-26 DIAGNOSIS — E785 Hyperlipidemia, unspecified: Secondary | ICD-10-CM

## 2013-10-26 DIAGNOSIS — I5022 Chronic systolic (congestive) heart failure: Secondary | ICD-10-CM | POA: Insufficient documentation

## 2013-10-26 MED ORDER — CARVEDILOL 12.5 MG PO TABS: 18.7500 mg | ORAL_TABLET | Freq: Two times a day (BID) | ORAL | Status: DC

## 2013-10-26 NOTE — Progress Notes (Addendum)
Patient ID: Peter Mclean, male   DOB: August 15, 1951, 63 y.o.   MRN: [id number]  PCP: Dr. Laverna Peace Primary Cardiologist: None Primary EP: Dr. Johney Frame  HPI:  [name] is a pleasant 63 yo male with a history of atrial arrhythmias including PACs, nonsustained atrial flutter, atrial fibrillation, HTN, dyslipidemia and tobacco use. He was seen by Dr. Johney Frame in 2010 prior to hernia surgery and was placed on Cardizem CD 180 mg daily and an aspirin.   Just discharged from the hospital for SOB and new onset CHF, EF 10-15% with diff HK complicated by atrial fib/flutter w RVR. He reports he had stopped taking his medications for a few weeks before presenting to the hospital. He underwent L/RHC which showed normal hemodynamics and normal coronaries. He underwent TEE-guided DCCV but had ERAF and amiodarone was started on 10/17/2013. EP saw in consult and recommended he continue amiodarone loading and return for DCCV in 2-3 weeks.   Follow up: He saw EP yesterday and they planned DCCV in early March. He denies any SOB, orthopnea, dizziness or edema. Reports weight has been stable. Following low salt diet and drinking less than 2L a day. Taking medications as prescribed.   SH: Smoking 1-2 cigarettes a day, works FT as over Corporate treasurer for [employer]  ROS: All systems negative except as listed in HPI, PMH and Problem List.  Past Medical History  Diagnosis Date  . Hypertension   . Atrial flutter     paroxysmal  . Dyslipidemia (high LDL; low HDL)   . Tobacco abuse   . Non-compliance   . Atrial fibrillation with rapid ventricular response     Current Outpatient Prescriptions  Medication Sig Dispense Refill  . albuterol (PROVENTIL HFA;VENTOLIN HFA) 108 (90 BASE) MCG/ACT inhaler Inhale 2 puffs into the lungs every 6 (six) hours as needed for wheezing or shortness of breath.      . ALPRAZolam (XANAX) 1 MG tablet Take 1 mg by mouth See admin instructions. One tablet at lunch, then one tablet in the evening.  May take an extra tab in the evening as needed for anxiety.      Marland Kitchen amiodarone (PACERONE) 200 MG tablet Take 1 tablet (200 mg total) by mouth 2 (two) times daily.  60 tablet  3  . apixaban (ELIQUIS) 5 MG TABS tablet Take 1 tablet (5 mg total) by mouth 2 (two) times daily.  60 tablet  3  . carvedilol (COREG) 12.5 MG tablet Take 1 tablet (12.5 mg total) by mouth 2 (two) times daily with a meal.  60 tablet  3  . cyclobenzaprine (FLEXERIL) 5 MG tablet Take 5 mg by mouth every evening.      . digoxin (LANOXIN) 0.125 MG tablet Take 1 tablet (0.125 mg total) by mouth daily.  30 tablet  3  . furosemide (LASIX) 40 MG tablet Take 1 tablet (40 mg total) by mouth as needed (take one tablet as needed for weight gain of >3lbs in one day.).  30 tablet  3  . losartan (COZAAR) 25 MG tablet Take 1 tablet (25 mg total) by mouth daily.  30 tablet  3  . triamcinolone (NASACORT) 55 MCG/ACT AERO nasal inhaler Place 2 sprays into both nostrils daily.       No current facility-administered medications for this encounter.    Filed Vitals:   10/26/13 1534 10/26/13 1535  BP:  124/64  Pulse:  119  Resp:  16  Weight: 134 lb 6.4 oz (60.963 kg)  SpO2:  100%   PHYSICAL EXAM: General:  Well appearing. No resp difficulty HEENT: normal Neck: supple. JVP flat. Carotids 2+ bilaterally; no bruits. No lymphadenopathy or thryomegaly appreciated. Cor: PMI normal. Regular rate & rhythm. No rubs, gallops or murmurs. Lungs: clear Abdomen: soft, nontender, nondistended. No hepatosplenomegaly. No bruits or masses. Good bowel sounds. Extremities: no cyanosis, clubbing, rash, edema Neuro: alert & orientedx3, cranial nerves grossly intact. Moves all 4 extremities w/o difficulty. Affect pleasant.  ASSESSMENT & PLAN:  1) Chronic systolic HF: NICM, EF 10-15% ([id number])  - Newly diagnosed HF in the setting of atrial fib with RVR. NICM is likely tachy mediated. He currently has NYHA I symptoms and would like to return back to work. Volume  status stable. Will continue lasix PRN for weight gain. - Increase coreg to 12.5 mg q am and 18.75 mg q pm and if he tolerates with no dizziness instructed to increase next week to 18.75 mg BID. - Continue losartan 50 mg daily and digoxin 0.125 - Will not add spiro, last K+ 5.2 (10/19/13). Will recheck BMET 2/24 appt with Dr. Mayford Knifeurner for PFTs - Patient would like to return to work. Provided him with a letter and discussed with him he needs to not lift more than 35 lbs and have rest breaks as needed.  - Reinforced the need and importance of daily weights, a low sodium diet, and fluid restriction (less than 2 L a day). Instructed to call the HF clinic if weight increases more than 3 lbs overnight or 5 lbs in a week.  2) Atrial fib/flutter - Increase coreg for rate control and LV dysfunction. Continue amiodarone and eliquis. EP to DC-CV in 3 weeks. 3) HTN - Stable. Increasing BB for LV dysfunction. 4) Nicotine use - Reports smoking 1-2 cigarettes a day. Praised for minimal use, but encouraged to quit completely.   F/U 1 month Ulla [place], Jazzman Loughmiller B NP-C 6:54 PM

## 2013-10-26 NOTE — Patient Instructions (Signed)
Increase your coreg to 18.75 mg (1 1/2 tablets) in the morning and 12.5 mg (1 tablet) in the evening. If you tolerate this dose for a week and do not have any dizziness then the following week increase your dose to 18.75 mg (1 1/2 tablet) twice a day.  Follow 1 month.  Call any issues [phone]  Bring all medications to next visit.   Do the following things EVERYDAY: 1) Weigh yourself in the morning before breakfast. Write it down and keep it in a log. 2) Take your medicines as prescribed 3) Eat low salt foods-Limit salt (sodium) to 2000 mg per day.  4) Stay as active as you can everyday 5) Limit all fluids for the day to less than 2 liters

## 2013-10-26 NOTE — Addendum Note (Signed)
Encounter addended by: Aundria Rud, NP on: 10/26/2013  6:57 PM<BR>     Documentation filed: Notes Section

## 2013-11-03 ENCOUNTER — Telehealth: Payer: Self-pay | Admitting: Internal Medicine

## 2013-11-03 NOTE — Telephone Encounter (Signed)
New message   Patient at work now -cell phone    Wife calling C/O husband legs are bruise  down to thigh.  S/p cath procedure

## 2013-11-03 NOTE — Telephone Encounter (Signed)
Pt had cath by Dr Herbie Baltimore on 1/29, have attempted to call and Left message to call back

## 2013-11-04 ENCOUNTER — Telehealth: Payer: Self-pay | Admitting: Internal Medicine

## 2013-11-04 NOTE — Telephone Encounter (Signed)
Spoke with wife and let her know that the bruising will likely travel down leg as it has before it goes away completely.  He is doing great and will keep hi follow up for next week

## 2013-11-04 NOTE — Telephone Encounter (Signed)
New Problem:  Pt's daughter is calling and c/o being bruised from his groin to his knee.. Pt states he was told to call back if he was still bruised this far out from his procedure. Pt's daughter would like a call back from the nurse.

## 2013-11-09 ENCOUNTER — Ambulatory Visit (HOSPITAL_COMMUNITY)
Admission: RE | Admit: 2013-11-09 | Discharge: 2013-11-09 | Disposition: A | Discharge status: Home / Self Care | Payer: PRIVATE HEALTH INSURANCE | Source: Ambulatory Visit | Attending: Internal Medicine | Admitting: Internal Medicine

## 2013-11-09 ENCOUNTER — Telehealth (HOSPITAL_COMMUNITY): Payer: Self-pay | Admitting: Cardiology

## 2013-11-09 ENCOUNTER — Other Ambulatory Visit (HOSPITAL_COMMUNITY): Payer: Self-pay | Admitting: Adult Health

## 2013-11-09 DIAGNOSIS — R52 Pain, unspecified: Secondary | ICD-10-CM

## 2013-11-09 DIAGNOSIS — M79609 Pain in unspecified limb: Secondary | ICD-10-CM | POA: Insufficient documentation

## 2013-11-09 DIAGNOSIS — M7989 Other specified soft tissue disorders: Secondary | ICD-10-CM

## 2013-11-09 MED ORDER — TRAMADOL HCL 50 MG PO TABS: 50.0000 mg | ORAL_TABLET | Freq: Two times a day (BID) | ORAL | Status: DC | PRN

## 2013-11-09 NOTE — Telephone Encounter (Signed)
pts wife called on behalf of pt,pt is c/o lower leg pain and states his leg is purple from all the bruising. Pt did go back to work early, was cleared to return on 3/4 but he is at work today Pt is requesting something for pain, leg pain is very painful and swollen  Per VO Dr.Bensimhon  Pt will need U/S to further assess

## 2013-11-10 ENCOUNTER — Other Ambulatory Visit: Payer: Self-pay | Admitting: Physician Assistant

## 2013-11-10 ENCOUNTER — Telehealth: Payer: Self-pay | Admitting: Physician Assistant

## 2013-11-10 ENCOUNTER — Encounter (HOSPITAL_COMMUNITY): Payer: Self-pay

## 2013-11-10 NOTE — Progress Notes (Signed)
VASCULAR LAB PRELIMINARY  PRELIMINARY  PRELIMINARY  PRELIMINARY  Right lower extremity venous duplex completed.    Preliminary report: Negative for deep and superficial vein thrombosis.  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  Appears to be coming from the posterior wall of the profunda artery.  There is a fair amount of hematoma in the area.  Spoke with Dr. Gala Romney, he ask that the patient return on Friday (11/11/2013)  to Short Stay for compression and he also called the patient in something in for pain control.Marland Kitchen  Carmina Walle, RVT 11/10/2013, 10:32 AM

## 2013-11-10 NOTE — Telephone Encounter (Signed)
Called to check on patient, spoke to his wife.  His pain just started 3 days ago.  She sates his leg is not swollen just has pain and bruising.  He is going in tomorrow for an out patient compression.

## 2013-11-10 NOTE — Telephone Encounter (Signed)
Scarlette Calico, Vascular Tech at [facility], called our office this morning to discuss finding of pseudoaneurysm last night. The patient had cath by Dr. Herbie Baltimore at end of January. He recently called office complaining of leg pain and bruising. Dr. Gala Romney had arranged lower extremity duplex for last night which per Hermenia Bers showed 1.45x1.25cm small pseudoaneurysm. She states she discussed results with Dr. Gala Romney last night who recommended arranging for the patient to come in for compression, which they plan to do on Friday. As requested, this was scheduled for 10:30am. We had to list a doctor's name for the procedure so listed Dr. Tenny Craw since she is DOD, but PA/NP will likely be the one to proctor the procedure. Vascular will be assessing the pseudo first then will call for compression if still there. I put basic orders to call vascular upon arrival, insert IV if pseudoaneurysm is still present, and check vitals upon arrival. Further orders will be placed by proctoring provider. Scarlette Calico said she was going to call the patient with instructions for NPO after midnight and to arrive to short stay at 9:30am. Ronie Spies PA-C

## 2013-11-10 NOTE — Telephone Encounter (Signed)
Peter Mclean is a 63 y.o. male with a recent dx of NICM, AFib/FLutter.  He had a LHC in 09/2013.  Called in recently with R groin pain.  [place] was ordered.  Patient's wife called in with request of pain medications.  I called the vascular tech at [facility] (report not in chart yet).  I spoke to Sherando who confirmed that the patient does have a small pseudoaneurysm and a mod sized hematoma.  I reviewed his chart.  Tramadol has already been called in to his pharmacy (done last night by on call MD).  Patient is being set up for outpatient compression tomorrow. Tereso Newcomer, PA-C   11/10/2013 10:56 AM

## 2013-11-11 ENCOUNTER — Ambulatory Visit (HOSPITAL_COMMUNITY)
Admission: RE | Admit: 2013-11-11 | Discharge: 2013-11-11 | Disposition: A | Discharge status: Home / Self Care | Payer: PRIVATE HEALTH INSURANCE | Source: Ambulatory Visit | Attending: Internal Medicine | Admitting: Internal Medicine

## 2013-11-11 ENCOUNTER — Encounter (HOSPITAL_COMMUNITY)
Admission: RE | Disposition: A | Discharge status: Home / Self Care | Payer: Self-pay | Source: Ambulatory Visit | Attending: Internal Medicine

## 2013-11-11 DIAGNOSIS — I724 Aneurysm of artery of lower extremity: Secondary | ICD-10-CM

## 2013-11-11 DIAGNOSIS — Y849 Medical procedure, unspecified as the cause of abnormal reaction of the patient, or of later complication, without mention of misadventure at the time of the procedure: Secondary | ICD-10-CM | POA: Insufficient documentation

## 2013-11-11 DIAGNOSIS — I999 Unspecified disorder of circulatory system: Secondary | ICD-10-CM | POA: Insufficient documentation

## 2013-11-11 DIAGNOSIS — Z538 Procedure and treatment not carried out for other reasons: Secondary | ICD-10-CM | POA: Insufficient documentation

## 2013-11-11 HISTORY — PX: PSEUDOANERYSM COMPRESSION: CATH118259

## 2013-11-11 SURGERY — PSEUDOANERYSM COMPRESSION

## 2013-11-11 NOTE — Progress Notes (Addendum)
Vascular Lab in to access right groin for pseudoaneurysm; Dr. Tenny Craw also present; groin accessed; compression cancelled; pt discharged home per self; intructions given for no heavy lifting

## 2013-11-11 NOTE — Addendum Note (Signed)
Encounter addended by: Noralee Space, RN on: 11/11/2013  2:54 PM<BR>     Documentation filed: Orders

## 2013-11-11 NOTE — Progress Notes (Signed)
VASCULAR LAB PRELIMINARY  PRELIMINARY  PRELIMINARY  PRELIMINARY  Recheck of rt pseudoaneurysm completed.    Preliminary report: Recheck reveals a trivial amount of flow at the edge of the previous pseudo.  Dr. Tenny Craw evaluated the patient and decided against compression dur to its decreased size.  Patient is to return on November 16, 2013 for re=evaluation.  Peter Mclean, RVT 11/11/2013, 5:12 PM

## 2013-11-16 ENCOUNTER — Ambulatory Visit (INDEPENDENT_AMBULATORY_CARE_PROVIDER_SITE_OTHER): Payer: PRIVATE HEALTH INSURANCE | Admitting: Internal Medicine

## 2013-11-16 ENCOUNTER — Encounter: Payer: Self-pay | Admitting: Internal Medicine

## 2013-11-16 ENCOUNTER — Other Ambulatory Visit: Payer: PRIVATE HEALTH INSURANCE

## 2013-11-16 VITALS — BP 162/90 | HR 58 | Ht 67.0 in | Wt 140.0 lb

## 2013-11-16 DIAGNOSIS — IMO0002 Reserved for concepts with insufficient information to code with codable children: Secondary | ICD-10-CM

## 2013-11-16 DIAGNOSIS — I4891 Unspecified atrial fibrillation: Secondary | ICD-10-CM

## 2013-11-16 DIAGNOSIS — I428 Other cardiomyopathies: Secondary | ICD-10-CM

## 2013-11-16 DIAGNOSIS — I4892 Unspecified atrial flutter: Secondary | ICD-10-CM

## 2013-11-16 MED ORDER — LOSARTAN POTASSIUM 50 MG PO TABS: 50.0000 mg | ORAL_TABLET | Freq: Every day | ORAL | Status: DC

## 2013-11-16 MED ORDER — AMIODARONE HCL 200 MG PO TABS: 200.0000 mg | ORAL_TABLET | Freq: Every day | ORAL | Status: DC

## 2013-11-16 NOTE — Patient Instructions (Signed)
Your physician recommends that you schedule a follow-up appointment in: 4 months with Dr Johney Frame  Your physician has requested that you have an echocardiogram. Echocardiography is a painless test that uses sound waves to create images of your heart. It provides your doctor with information about the size and shape of your heart and how well your heart's chambers and valves are working. This procedure takes approximately one hour. There are no restrictions for this procedure.---in 3 months  Your physician has recommended you make the following change in your medication:  1) Decresae Amiodarone to 200mg  daily 2) Increase Losartan to 50mg  daily

## 2013-11-17 DIAGNOSIS — IMO0002 Reserved for concepts with insufficient information to code with codable children: Secondary | ICD-10-CM | POA: Insufficient documentation

## 2013-11-17 NOTE — Progress Notes (Signed)
PCP:  Pamelia Hoit, MD  The patient presents today for routine electrophysiology followup.  Since his recent hospitalization, the patient reports doing reasonably well.   His SOB is much improved.  He continues to have some pain and swelling within his R leg but does feel that this is improving slowly. Today, he denies symptoms of palpitations, chest pain,  orthopnea, PND, lower extremity edema, dizziness, presyncope, syncope, or neurologic sequela.  The patient feels that he is tolerating medications without difficulties and is otherwise without complaint today.   Past Medical History  Diagnosis Date  . Hypertension   . Atrial flutter     paroxysmal  . Dyslipidemia (high LDL; low HDL)   . Tobacco abuse   . Non-compliance   . Atrial fibrillation with rapid ventricular response   . CHF (congestive heart failure)   . NICM (nonischemic cardiomyopathy)   . Acute systolic CHF (congestive heart failure)   . Anxiety   . Arthritis   . H/O hematuria   . History of inguinal hernia    Past Surgical History  Procedure Laterality Date  . Hernia repair  1992 & 2011  . Cholecystectomy    . Tee without cardioversion N/A 10/17/2013    Procedure: TRANSESOPHAGEAL ECHOCARDIOGRAM (TEE);  Surgeon: Thurmon Fair, MD;  Location: [facility] ENDOSCOPY;  Service: Cardiovascular;  Laterality: N/A;  . Cardioversion N/A 10/17/2013    Procedure: CARDIOVERSION;  Surgeon: Thurmon Fair, MD;  Location: [facility] ENDOSCOPY;  Service: Cardiovascular;  Laterality: N/A;    Current Outpatient Prescriptions  Medication Sig Dispense Refill  . albuterol (PROVENTIL HFA;VENTOLIN HFA) 108 (90 BASE) MCG/ACT inhaler Inhale 2 puffs into the lungs daily as needed for wheezing or shortness of breath.       . ALPRAZolam (XANAX) 1 MG tablet Take 1 mg by mouth daily.       Marland Kitchen amiodarone (PACERONE) 200 MG tablet Take 1 tablet (200 mg total) by mouth daily.  60 tablet  3  . apixaban (ELIQUIS) 5 MG TABS tablet Take 1 tablet (5 mg total) by  mouth 2 (two) times daily.  60 tablet  3  . carvedilol (COREG) 12.5 MG tablet Take 12.5 mg by mouth 2 (two) times daily with a meal.      . cyclobenzaprine (FLEXERIL) 5 MG tablet Take 5 mg by mouth every evening.      . digoxin (LANOXIN) 0.125 MG tablet Take 0.125 mg by mouth daily.      . furosemide (LASIX) 40 MG tablet Take 40 mg by mouth daily as needed (for weight gain of >3lbs in one day.).      Marland Kitchen losartan (COZAAR) 50 MG tablet Take 1 tablet (50 mg total) by mouth daily.  90 tablet  3  . traMADol (ULTRAM) 50 MG tablet Take 1 tablet (50 mg total) by mouth every 12 (twelve) hours as needed.  20 tablet  0  . triamcinolone (NASACORT) 55 MCG/ACT AERO nasal inhaler Place 2 sprays into both nostrils daily as needed (allergies).        No current facility-administered medications for this visit.    No Known Allergies  History   Social History  . Marital Status: Married    Spouse Name: N/A    Number of Children: N/A  . Years of Education: N/A   Occupational History  . [employer] in [employer]  .     Marland Kitchen      Social History Main Topics  . Smoking status:  Current Every Day Smoker -- 0.50 packs/day for 40 years  . Smokeless tobacco: Not on file     Comment: Has cut back to 2-3 cigarettes per day.  . Alcohol Use: Yes     Comment: 1-2 A DAY  . Drug Use: Not on file  . Sexual Activity: Not on file   Other Topics Concern  . Not on file   Social History Narrative   [place]     Family History  Problem Relation Age of Onset  . Hyperlipidemia Mother   . Hypertension Mother   . Heart attack Father     ROS-  All systems are reviewed and are negative except as outlined in the HPI above  Physical Exam: Filed Vitals:   11/16/13 1632  BP: 162/90  Pulse: 58  Height: 5\' 7"  (1.702 m)  Weight: 140 lb (63.504 kg)    GEN- The patient is well appearing, alert and oriented x 3 today.   Head- normocephalic, atraumatic Eyes-  Sclera  clear, conjunctiva pink Ears- hearing intact Oropharynx- clear Neck- supple, no JVP Lymph- no cervical lymphadenopathy Lungs- Clear to ausculation bilaterally, normal work of breathing Heart- Regular rate and rhythm, no murmurs, rubs or gallops, PMI not laterally displaced GI- soft, NT, ND, + BS Extremities- no clubbing, cyanosis, +1 swelling of the R posterior thigh/leg, no bruit noted today MS- no significant deformity or atrophy Skin- no rash or lesion Psych- euthymic mood, full affect Neuro- strength and sensation are intact  ekg today reveals sinus rhythm 58 bpm, with PACs, PR 176, QRS 106, qtc 422,nonspecific ST/T changes Epic notes are reviewed Dr Bruna Potteraylors consult note is reviewed Echo reveals EF 10-15%, moderate RV dysfunction, normal sized LA  Assessment and Plan:  1. Persistent afib The patient recently presented with refractory afib.  He is appropriately anticoagulated with eliquis.  He has spontaneously converted to sinus rhythm with amiodarone 200mg  daily.  Hopefully his EF will recover.   Eventually we may be able to reduce amiodarone to 100mg  daily.  Ablation may also be an option down the road.  2. Severe biventricular failure/ chronic systolic dysfuction Recent echo is reviewed.  I suspect that this is due to afib with RVR.  Hopefully his EF will improve with sinus rhythm Increase losartan to 50mg  daily today Repeat echo in 3 months  3. htn Above goal Increase losartan  4. Pseudoaneurysm Recent US results are reviewed Clinically there is no bruit today and he appears to be making slow but steady recovery He will contact our office if symptoms worsen  Return in 4 months for follow-up

## 2013-11-24 ENCOUNTER — Ambulatory Visit (HOSPITAL_COMMUNITY)
Admission: RE | Admit: 2013-11-24 | Discharge: 2013-11-24 | Disposition: A | Discharge status: Home / Self Care | Payer: PRIVATE HEALTH INSURANCE | Source: Ambulatory Visit | Attending: Internal Medicine | Admitting: Internal Medicine

## 2013-11-24 ENCOUNTER — Telehealth (HOSPITAL_COMMUNITY): Payer: Self-pay | Admitting: Adult Health

## 2013-11-24 ENCOUNTER — Encounter (HOSPITAL_COMMUNITY): Payer: Self-pay

## 2013-11-24 VITALS — BP 104/66 | HR 54 | Resp 18 | Wt 138.1 lb

## 2013-11-24 DIAGNOSIS — I4892 Unspecified atrial flutter: Secondary | ICD-10-CM | POA: Insufficient documentation

## 2013-11-24 DIAGNOSIS — F17200 Nicotine dependence, unspecified, uncomplicated: Secondary | ICD-10-CM | POA: Insufficient documentation

## 2013-11-24 DIAGNOSIS — I4891 Unspecified atrial fibrillation: Secondary | ICD-10-CM | POA: Insufficient documentation

## 2013-11-24 DIAGNOSIS — I509 Heart failure, unspecified: Secondary | ICD-10-CM | POA: Insufficient documentation

## 2013-11-24 DIAGNOSIS — I428 Other cardiomyopathies: Secondary | ICD-10-CM | POA: Insufficient documentation

## 2013-11-24 DIAGNOSIS — E785 Hyperlipidemia, unspecified: Secondary | ICD-10-CM | POA: Insufficient documentation

## 2013-11-24 DIAGNOSIS — Z79899 Other long term (current) drug therapy: Secondary | ICD-10-CM | POA: Insufficient documentation

## 2013-11-24 DIAGNOSIS — F411 Generalized anxiety disorder: Secondary | ICD-10-CM | POA: Insufficient documentation

## 2013-11-24 DIAGNOSIS — I10 Essential (primary) hypertension: Secondary | ICD-10-CM | POA: Insufficient documentation

## 2013-11-24 DIAGNOSIS — I5022 Chronic systolic (congestive) heart failure: Secondary | ICD-10-CM | POA: Insufficient documentation

## 2013-11-24 LAB — BASIC METABOLIC PANEL
BUN: 22 mg/dL (ref 6–23)
CO2: 29 mEq/L (ref 19–32)
Calcium: 9.1 mg/dL (ref 8.4–10.5)
Chloride: 103 mEq/L (ref 96–112)
Creatinine, Ser: 1.08 mg/dL (ref 0.50–1.35)
GFR calc Af Amer: 83 mL/min — ABNORMAL LOW (ref >90)
GFR calc non Af Amer: 72 mL/min — ABNORMAL LOW (ref >90)
Glucose, Bld: 93 mg/dL (ref 70–99)
Potassium: 4.5 mEq/L (ref 3.7–5.3)
Sodium: 143 mEq/L (ref 137–147)

## 2013-11-24 LAB — DIGOXIN LEVEL: Digoxin Level: 0.8 ng/mL (ref 0.8–2.0)

## 2013-11-24 NOTE — Progress Notes (Signed)
Patient ID: Peter Mclean, male   DOB: 09/18/1950, 63 y.o.   MRN: [id number]  PCP: Dr. Laverna PeaceHenry Mclean Primary Cardiologist: None Primary EP: Dr. Johney Mclean  HPI: Mr. Peter Mclean is a pleasant 63 yo male with a history of atrial arrhythmias including PACs, nonsustained atrial flutter, atrial fibrillation, HTN, dyslipidemia and tobacco use. He was seen by Dr. Johney Mclean in 2010 prior to hernia surgery and was placed on Cardizem CD 180 mg daily and an aspirin.   [facility] February with  SOB and new onset CHF, EF 10-15% with diff HK complicated by atrial fib/flutter w RVR. He reports he had stopped taking his medications for a few weeks before presenting to the hospital. He underwent L/RHC which showed normal hemodynamics and normal coronaries. He underwent TEE-guided DCCV but had ERAF and amiodarone was started on 10/17/2013. EP saw in consult and recommended he continue amiodarone loading and return for DCCV in 2-3 weeks.   He returns for follow up. Last carvedilol was increased at night to 18.75 mg.  He was intolerant of increase due to dizziness. He saw EP on 3/4 and at that time  He spontaneously converted to NSR and  losartan was increased to 50 mg daily.  Denies SOB/PND. Sleeps in a recliner because it is a habit (3 years)  not because he cant get his breath.  Weight at home 130-132 pounds. He has not required any lasix. Able to walk up steps. Compliant with medications. Works full time. Smoking 3-4 cigarettes per day.    10/19/13 K 5.2 Creatinine 1.32.   SH: Smoking 1-2 cigarettes a day, works FT as over Corporate treasurerhauler for [employer]  ROS: All systems negative except as listed in HPI, PMH and Problem List.  Past Medical History  Diagnosis Date  . Hypertension   . Atrial flutter     paroxysmal  . Dyslipidemia (high LDL; low HDL)   . Tobacco abuse   . Non-compliance   . Atrial fibrillation with rapid ventricular response   . CHF (congestive heart failure)   . NICM (nonischemic cardiomyopathy)    . Acute systolic CHF (congestive heart failure)   . Anxiety   . Arthritis   . H/O hematuria   . History of inguinal hernia     Current Outpatient Prescriptions  Medication Sig Dispense Refill  . albuterol (PROVENTIL HFA;VENTOLIN HFA) 108 (90 BASE) MCG/ACT inhaler Inhale 2 puffs into the lungs daily as needed for wheezing or shortness of breath.       . ALPRAZolam (XANAX) 1 MG tablet Take 1 mg by mouth daily.       Marland Kitchen. amiodarone (PACERONE) 200 MG tablet Take 1 tablet (200 mg total) by mouth daily.  60 tablet  3  . apixaban (ELIQUIS) 5 MG TABS tablet Take 1 tablet (5 mg total) by mouth 2 (two) times daily.  60 tablet  3  . carvedilol (COREG) 12.5 MG tablet Take 12.5 mg by mouth 2 (two) times daily with a meal.      . cyclobenzaprine (FLEXERIL) 5 MG tablet Take 5 mg by mouth every evening.      . digoxin (LANOXIN) 0.125 MG tablet Take 0.125 mg by mouth daily.      . furosemide (LASIX) 40 MG tablet Take 40 mg by mouth daily as needed (for weight gain of >3lbs in one day.).      Marland Kitchen. losartan (COZAAR) 50 MG tablet Take 1 tablet (50 mg total) by mouth daily.  90 tablet  3  . traMADol (ULTRAM) 50 MG tablet Take 1 tablet (50 mg total) by mouth every 12 (twelve) hours as needed.  20 tablet  0  . triamcinolone (NASACORT) 55 MCG/ACT AERO nasal inhaler Place 2 sprays into both nostrils daily as needed (allergies).        No current facility-administered medications for this encounter.    Filed Vitals:   11/24/13 1448  BP: 104/66  Pulse: 54  Resp: 18  Weight: 138 lb 2 oz (62.653 kg)  SpO2: 98%   PHYSICAL EXAM: General:  Well appearing. No resp difficulty HEENT: normal Neck: supple. JVP 6-7. Carotids 2+ bilaterally; no bruits. No lymphadenopathy or thryomegaly appreciated. Cor: PMI normal. Regular rate & rhythm. No rubs, gallops or  2/6 MR  Lungs: Decreased throughppt Abdomen: soft, nontender, nondistended. No hepatosplenomegaly. No bruits or masses. Good bowel sounds. Extremities: no  cyanosis, clubbing, rash, edema Neuro: alert & orientedx3, cranial nerves grossly intact. Moves all 4 extremities w/o difficulty. Affect pleasant.  ASSESSMENT & PLAN:  1) Chronic systolic HF: NICM, EF 20-25% (02/17/353)  - Newly diagnosed HF January 2015  in the setting of atrial fib with RVR. NICM ipossibley  tachy mediated. He currently has NYHA I symptoms. Volume status stable on prn lasix.  Continue carvedilol 12.5 mg twice a day and digoxin 0.125 mg  - Check dig level today  Continue losartan 50 mg daily. Check BMET today - He is not on spiro due to elevated K.  - Reinforced the need and importance of daily weights, a low sodium diet, and fluid restriction (less than 2 L a day). Instructed to call the HF clinic if weight increases more than 3 lbs overnight or 5 lbs in a week.  Will need repeat ECHO .  2) Atrial fib/flutter - Rate controlled.  Continue amiodarone 100 mg daily and eliqius 5 mg twice a day.  Per Dr Peter Frame.  3) HTN - Stable.Continue current dose bb, arb,  Increasing BB for LV dysfunction. 4) Nicotine use - Reports smoking 1-2 cigarettes a day. Encouraged to quit smoking.   Follow up in 1 month  Peter Mclean,AMY NP-C 3:01 PM

## 2013-11-24 NOTE — Telephone Encounter (Signed)
Provided with lab result.   Lab work stable.   [name] verbalized understanding.   Denee Boeder NP-C 4:39 PM

## 2013-11-24 NOTE — Patient Instructions (Signed)
Follow up in 4 weeks  Do the following things EVERYDAY: 1) Weigh yourself in the morning before breakfast. Write it down and keep it in a log. 2) Take your medicines as prescribed 3) Eat low salt foods-Limit salt (sodium) to 2000 mg per day.  4) Stay as active as you can everyday 5) Limit all fluids for the day to less than 2 liters 

## 2013-12-13 ENCOUNTER — Emergency Department (HOSPITAL_COMMUNITY): Payer: PRIVATE HEALTH INSURANCE

## 2013-12-13 ENCOUNTER — Emergency Department (HOSPITAL_COMMUNITY)
Admission: EM | Admit: 2013-12-13 | Discharge: 2013-12-13 | Disposition: A | Discharge status: Home / Self Care | Payer: PRIVATE HEALTH INSURANCE | Attending: Emergency Medicine | Admitting: Emergency Medicine

## 2013-12-13 ENCOUNTER — Encounter (HOSPITAL_COMMUNITY): Payer: Self-pay | Admitting: Emergency Medicine

## 2013-12-13 ENCOUNTER — Telehealth: Payer: Self-pay | Admitting: Internal Medicine

## 2013-12-13 DIAGNOSIS — I509 Heart failure, unspecified: Secondary | ICD-10-CM | POA: Insufficient documentation

## 2013-12-13 DIAGNOSIS — R001 Bradycardia, unspecified: Secondary | ICD-10-CM | POA: Diagnosis present

## 2013-12-13 DIAGNOSIS — Z9119 Patient's noncompliance with other medical treatment and regimen: Secondary | ICD-10-CM | POA: Insufficient documentation

## 2013-12-13 DIAGNOSIS — I4891 Unspecified atrial fibrillation: Secondary | ICD-10-CM

## 2013-12-13 DIAGNOSIS — E785 Hyperlipidemia, unspecified: Secondary | ICD-10-CM | POA: Insufficient documentation

## 2013-12-13 DIAGNOSIS — I4892 Unspecified atrial flutter: Secondary | ICD-10-CM | POA: Diagnosis present

## 2013-12-13 DIAGNOSIS — I498 Other specified cardiac arrhythmias: Secondary | ICD-10-CM | POA: Insufficient documentation

## 2013-12-13 DIAGNOSIS — Z79899 Other long term (current) drug therapy: Secondary | ICD-10-CM | POA: Insufficient documentation

## 2013-12-13 DIAGNOSIS — Z91199 Patient's noncompliance with other medical treatment and regimen due to unspecified reason: Secondary | ICD-10-CM | POA: Insufficient documentation

## 2013-12-13 DIAGNOSIS — IMO0002 Reserved for concepts with insufficient information to code with codable children: Secondary | ICD-10-CM | POA: Insufficient documentation

## 2013-12-13 DIAGNOSIS — Z8719 Personal history of other diseases of the digestive system: Secondary | ICD-10-CM | POA: Insufficient documentation

## 2013-12-13 DIAGNOSIS — I428 Other cardiomyopathies: Secondary | ICD-10-CM | POA: Diagnosis present

## 2013-12-13 DIAGNOSIS — F411 Generalized anxiety disorder: Secondary | ICD-10-CM | POA: Insufficient documentation

## 2013-12-13 DIAGNOSIS — M129 Arthropathy, unspecified: Secondary | ICD-10-CM | POA: Insufficient documentation

## 2013-12-13 DIAGNOSIS — I10 Essential (primary) hypertension: Secondary | ICD-10-CM | POA: Diagnosis present

## 2013-12-13 DIAGNOSIS — F17200 Nicotine dependence, unspecified, uncomplicated: Secondary | ICD-10-CM | POA: Insufficient documentation

## 2013-12-13 DIAGNOSIS — Z7901 Long term (current) use of anticoagulants: Secondary | ICD-10-CM | POA: Insufficient documentation

## 2013-12-13 DIAGNOSIS — I5021 Acute systolic (congestive) heart failure: Secondary | ICD-10-CM | POA: Insufficient documentation

## 2013-12-13 DIAGNOSIS — I5022 Chronic systolic (congestive) heart failure: Secondary | ICD-10-CM

## 2013-12-13 DIAGNOSIS — Z87448 Personal history of other diseases of urinary system: Secondary | ICD-10-CM | POA: Insufficient documentation

## 2013-12-13 DIAGNOSIS — I4819 Other persistent atrial fibrillation: Secondary | ICD-10-CM | POA: Diagnosis present

## 2013-12-13 HISTORY — DX: Bradycardia, unspecified: R00.1

## 2013-12-13 HISTORY — DX: Chronic systolic (congestive) heart failure: I50.22

## 2013-12-13 HISTORY — DX: Unspecified atrial flutter: I48.92

## 2013-12-13 HISTORY — DX: Aneurysm of unspecified site: I72.9

## 2013-12-13 HISTORY — DX: Paroxysmal atrial fibrillation: I48.0

## 2013-12-13 HISTORY — DX: Complication of other artery following a procedure, not elsewhere classified, initial encounter: T81.718A

## 2013-12-13 LAB — CBC
HCT: 39.9 % (ref 39.0–52.0)
Hemoglobin: 13.7 g/dL (ref 13.0–17.0)
MCH: 31.4 pg (ref 26.0–34.0)
MCHC: 34.3 g/dL (ref 30.0–36.0)
MCV: 91.5 fL (ref 78.0–100.0)
Platelets: 208 10*3/uL (ref 150–400)
RBC: 4.36 MIL/uL (ref 4.22–5.81)
RDW: 13.3 % (ref 11.5–15.5)
WBC: 7.7 10*3/uL (ref 4.0–10.5)

## 2013-12-13 LAB — BASIC METABOLIC PANEL
BUN: 19 mg/dL (ref 6–23)
CO2: 26 mEq/L (ref 19–32)
Calcium: 9.1 mg/dL (ref 8.4–10.5)
Chloride: 103 mEq/L (ref 96–112)
Creatinine, Ser: 0.98 mg/dL (ref 0.50–1.35)
GFR calc Af Amer: >90 mL/min (ref >90)
GFR calc non Af Amer: 86 mL/min — ABNORMAL LOW (ref >90)
Glucose, Bld: 87 mg/dL (ref 70–99)
Potassium: 3.7 mEq/L (ref 3.7–5.3)
Sodium: 141 mEq/L (ref 137–147)

## 2013-12-13 LAB — DIGOXIN LEVEL: Digoxin Level: 1.1 ng/mL (ref 0.8–2.0)

## 2013-12-13 LAB — I-STAT TROPONIN, ED: Troponin i, poc: 0.01 ng/mL (ref 0.00–0.08)

## 2013-12-13 LAB — PRO B NATRIURETIC PEPTIDE: Pro B Natriuretic peptide (BNP): 374.9 pg/mL — ABNORMAL HIGH (ref 0–125)

## 2013-12-13 MED ORDER — CARVEDILOL 12.5 MG PO TABS: 6.2500 mg | ORAL_TABLET | Freq: Two times a day (BID) | ORAL | Status: DC

## 2013-12-13 NOTE — ED Notes (Signed)
Patient transported to X-ray 

## 2013-12-13 NOTE — Telephone Encounter (Signed)
Received t/c back that pt is going to the ED at [facility].  Paged Trish to let her know.

## 2013-12-13 NOTE — ED Notes (Addendum)
Pt reports going to nurse at work today for bp and HR check due to headache and dizziness x 3 days. Reports coming here due to HR being in the 40's, HR 55 at triage. Denies any cp. Reports that dizziness is positional. ekg done at triage, airway intact.

## 2013-12-13 NOTE — Consult Note (Signed)
Cardiology Consultation Note  Patient ID: RUBERT FREDIANI MRN: [id number], DOB: 1951/03/15 Date of Encounter: 12/13/2013, 5:11 PM Primary Physician: Woody Seller, MD Primary Cardiologist: CHF clinic; EP - [facility]  Chief Complaint: slow heart rate when nurse checked it Reason for Consult:  bradycardia  HPI:Mr. Mainwaring is a 63 y/o male with a history of atrial arrhythmias including NICM, PACs, paroxysmal atrial fib & flutter, HTN, dyslipidemia and tobacco use. He was seen by Dr. Rayann Heman in 2010 prior to hernia surgery and was placed on Cardizem CD 180 mg daily and an aspirin. He was admitted to [facility] 1/28-10/20/13 with new onset CHF, EF 10-15% with diff HK in the setting of AF/AFL-RVR. He had stopped taking his medications for a few weeks before presenting to the hospital. He underwent L/RHC which showed normal hemodynamics and normal coronaries. He underwent TEE-guided DCCV but had early return of AF and amiodarone was started on 10/17/2013. EP saw in consult and recommended he continue amiodarone loading and return for DCCV in 2-3 weeks. There was an attempt to increase Coreg 10/26/13 to 18.68m BID but he did not tolerate this due to mild dizziness so went back to 12.56mBID. When he saw Dr. AlRayann Hemann clinic 11/16/13 he had spontaneously converted to NSR and amiodarone was reduced to 20034maily. He felt that eventually we may be able to reduce amio to 100m52mily and that ablation may also be an option down the road. Echo planned for 3 months. The patient was recently seen in CHF clinic 11/24/13 at which time no med changes were made. He is also on digoxin 0.125mg90mly.  He is here today because the nurse at his work recommended he seek medical attention. He felt fine in his usual state of health today. At his job he routinely goes to get his blood pressure checked several times a week. The nurse told him his heart rate was 40-44 and BP was 100/52 and instructed him to discuss further with his doctor.  His daughter became very concerned and he came to the ER for evaluation. He denies any acute symptoms. He has mild transient dizziness from time to time, not particularly bothersome, not associated with any other symptoms, no provocative measures, no pre-syncope or syncope. He would not have actively sought care today for any symptomatic reason other than this vital check today. He did not seek out to have his BP/HR checked because he was feeling bad today, only because it is routine for him. No chest pain, SOB, LEE, orthopnea. CXR stable. In the ER, HR is in the mid-upper 40s, BP stable, not hypoxic. He currently denies any acute symptoms. Reports compliance with meds. He has not had to take any Lasix recently.  Past Medical History  Diagnosis Date  . Hypertension   . Paroxysmal atrial flutter     paroxysmal  . Dyslipidemia (high LDL; low HDL)   . Tobacco abuse   . Non-compliance     a. Adm 09/2013 after having stopped his meds.  . Paroxysmal atrial fibrillation   . NICM (nonischemic cardiomyopathy)   . Chronic systolic CHF (congestive heart failure)     a. Dx 09/2013 in setting of AF-RVR; EF 10-15%, possibly tachy-mediated. Cath 09/2013: normal cors. b. TEE: EF 20-25% by echo 10/2013.  . AnxMarland Kitchenety   . Arthritis   . H/O hematuria   . History of inguinal hernia   . Premature atrial contractions   . Pseudoaneurysm following procedure     a. RLE  pseudoaneurysm after cath, spontaneously decreased in size, did not require compression.     Most Recent Cardiac Studies: TEE 10/17/13 - Left ventricle: The cavity size was mildly dilated. Wall thickness was normal. Systolic function was normal. The estimated ejection fraction was in the range of 20% to 25%. Diffuse hypokinesis. - Left atrium: No evidence of thrombus in the atrial cavity or appendage. - Right atrium: No evidence of thrombus in the atrial cavity or appendage.  Cardiac Cath 10/13/13 NAME: VERLON PISCHKE MRN: [id number]  DOB: 11-28-1950  ADMIT DATE: 10/12/2013  Procedure Date: 10/13/2013  INTERVENTIONAL CARDIOLOGIST: Leonie Man, M.D., MS  PRIMARY CARE PROVIDER: Woody Seller, MD  PRIMARY CARDIOLOGIST: Milana Na MD  PATIENT: DEVRIN MONFORTE is a 63 y.o. male with a history of atrial arrhythmias with PACs, nonsustained atrial flutter as well as atrial fibrillation. He is a patient of Dr. Rayann Heman. He admits to being off his medications for a while, just not taking them. He also has had a five-week history of upper respiratory tract infection. He was admitted on January 28 with a four-day history of significant exertional dyspnea with PND and orthopnea. On admission his atrial fibrillation rate was in the 170s. His blood pressure is also elevated. He has been diuresed and rate controlled, his echocardiogram however showed a significantly reduced ejection fraction of roughly 15%.  He was seen today by Dr. Ron Parker on rounds, who is referred for right and left heart catheterization to rule out ischemic cardiac myopathy.  PRE-OPERATIVE DIAGNOSIS:  Severe Cardiomyopathy, etiology unknown  Atrial fibrillation/flutter with intermittent rate control. PROCEDURES PERFORMED:  Right and Left Heart Catheterization with Native Coronary Angiography.  Ultrasound-guided access Of Right Common Femoral Vein PROCEDURE:Consent: Risks of procedure as well as the alternatives and risks of each were explained to the (patient/caregiver). Consent for procedure obtained.  Consent for signed by MD and patient with RN witness -- placed on chart.  PROCEDURE: The patient was brought to the 2nd [facility] in the fasting state and prepped and draped in the usual sterile fashion for Right groin or radial access. A modified Allen's test with plethysmography was performed, revealing excellent Ulnar artery collateral flow. Sterile technique was used including antiseptics, cap, gloves, gown, hand hygiene, mask and sheet. Skin prep:  Chlorhexidine.  Time Out: Verified patient identification, verified procedure, site/side was marked, verified correct patient position, special equipment/implants available, medications/allergies/relevent history reviewed, required imaging and test results available. Performed  Access:  Right Radial Artery 6 Fr Sheath -- Seldinger technique (Angiocath Micropuncture Kit) IA Radial Cocktail, IV Heparin  Right Common Femoral Vein: 7 Fr Sheath - direct ultrasound-guided Seldinger Technique. Right Heart Catheterization: 7Fr Gordy Councilman catheter advanced under fluoroscopy with balloon inflated to the RA, RV, then PCWP-PA for hemodynamic measurement.  Simultaneous FA & PA blood gases checked for SaO2% to calculate FICK CO/CI  Thermodilution Injections performed to calculate CO/CI  Simultaneous PCWP/LV & RV/LV pressures monitored with Angled Pigtail in LV.  Catheter removed completely out of the body with balloon deflated.  Left Heart Catheterization: 5 Fr Catheters advanced or exchanged over a long exchange J-wire;  LV Hemodynamics (LV Gram): Initially TIG 4.0 followed by JR 4  Left Coronary Artery Cineangiography: TIG 4.0 Catheter  Right Coronary Artery Cineangiography: TIG 4.0 Catheter  TR Band: 1700 Hours, 112 mL air  Venous Sheath: Removed in Cath Lab.  FINDINGS:  Hemodynamics:   SaO2%  Pressures mmHg  Mean P  mmHg  EDP  mmHg  Right Atrium   4/4   2   Right Ventricle   22/0   2   Pulmonary Artery  55%  22/10  14    PCWP   8/7  7    Central Aortic  91%  101/71  83    Left Ventricle   101/6   13          Cardiac Output:   Cardiac Index:    Fick  2.96   1.7    Thermodilution  3.12   1.8    Coronary Anatomy: Angiographically normal coronary arteries, with slow flow do to poor EF  Left Main: Large-caliber vessel that appears to trifurcates into the LAD, Ramus Intermedius, and Circumflex. Angiographically normal. LAD: Large-caliber wraparound LAD. Mild 10-20% stenoses at the branch point with  a large septal trunk and first diagonal (D1) branch. Just prior to this bifurcation there is a 6 significant SP1 and then at this location there is a bifurcating SP 2. This large-caliber vessel that wraps around the apex and perfuses the distal inferoapex  D1: Large-caliber bifurcating vessel with several other small branches. Angiographically normal. Left Circumflex: Large-caliber vessel with a couple small proximal marginal branches. Vessel bifurcates in the AV groove into a small AV groove branch followed by the OM branch which gives off OM1 and OM 2/LPL 1. Angiographically normal  OM1: Moderate large-caliber somewhat tortuous vessel that reaches two thirds the way to the inferolateral apex. Angiographically normal Ramus intermedius: Large-caliber vessel, at least as big as the LAD that courses mostly as a proximal OM1. Has a major moderate caliber branch in the mid vessel. The vessel itself reaches down to the inferoapex similar to the LAD. Angiographically normal  RCA: Large caliber, dominant vessel with a proximal conus branch followed by a mid-vessel moderate caliber RV marginal branch. The vessel bifurcates distally into the Right Posterior Descending Artery (RPDA) and the Right Posterior AV Groove Branch (RPAV). Angiographically normal RCA system  RPDA: Moderate large-caliber vessel which reaches two thirds the way to the apex tapering.  RPL Sysytem:The RPAV begins as a large caliber, that bifurcates into 2 posterior lateral branches. PL 1 begin a moderate caliber vessel bifurcates into 2 small caliber vessels. PL 2 continuation of the RPAV that is somewhat tortuous and covers a significant portion of the inferolateral/posterolateral wall.  Left Ventriculography: 10 mL hand-injection revealed dilated left ventricle with EF roughly 15-20% MEDICATIONS:  Anesthesia: Local Lidocaine 10 ml Sedation: 1 mg IV Versed, 75 mcg IV fentanyl ;  Omnipaque Contrast: 70 ml  Anticoagulation: IV Heparin 3000  Units  Radial Cocktail: 5 mg Verapamil, 400 mcg NTG, 2 ml 2% Lidocaine in 10 ml NS PATIENT DISPOSITION:  The patient was transferred to the PACU holding area in a hemodynamicaly stable, chest pain free condition.  The patient tolerated the procedure well, and there were no complications. EBL: < 10 ml  The patient was stable before, during, and after the procedure. POST-OPERATIVE DIAGNOSIS:  Severe dilated Nonischemic Dilated Cardiomyopathy  Adequately diuresis with relatively normal right heart pressures.  Severely decreased cardiac output and index. PLAN OF CARE:  Standard post radial cath care.  Continue to optimize medical therapy for Nonischemic Cardiomyopathy and Atrial Fibrillation.  Restart Eliquis tonight  2D echo 10/13/13 - Left ventricle: The cavity size was mildly dilated. Systolic function was severely reduced. The estimated ejection fraction was in the range of 10% to 15%. Diffuse hypokinesis. - Right ventricle: The cavity size was moderately dilated. Systolic function was  mildly to moderately reduced. - Right atrium: The atrium was mildly dilated.    Surgical History:  Past Surgical History  Procedure Laterality Date  . Hernia repair  1992 & 2011  . Cholecystectomy    . Tee without cardioversion N/A 10/17/2013    Procedure: TRANSESOPHAGEAL ECHOCARDIOGRAM (TEE);  Surgeon: Sanda Klein, MD;  Location: [facility];  Service: Cardiovascular;  Laterality: N/A;  . Cardioversion N/A 10/17/2013    Procedure: CARDIOVERSION;  Surgeon: Sanda Klein, MD;  Location: [facility];  Service: Cardiovascular;  Laterality: N/A;     Home Meds: Prior to Admission medications   Medication Sig Start Date End Date Taking? Authorizing Provider  albuterol (PROVENTIL HFA;VENTOLIN HFA) 108 (90 BASE) MCG/ACT inhaler Inhale 2 puffs into the lungs daily as needed for wheezing or shortness of breath.     Historical Provider, MD  ALPRAZolam Duanne Moron) 1 MG tablet Take 1 mg by mouth daily.      Historical Provider, MD  amiodarone (PACERONE) 200 MG tablet Take 1 tablet (200 mg total) by mouth daily. 11/16/13   Thompson Grayer, MD  apixaban (ELIQUIS) 5 MG TABS tablet Take 1 tablet (5 mg total) by mouth 2 (two) times daily. 10/19/13   Perry Mount, PA-C  carvedilol (COREG) 12.5 MG tablet Take 12.5 mg by mouth 2 (two) times daily with a meal.    Historical Provider, MD  cyclobenzaprine (FLEXERIL) 5 MG tablet Take 5 mg by mouth every evening.    Historical Provider, MD  digoxin (LANOXIN) 0.125 MG tablet Take 0.125 mg by mouth daily. 10/19/13   Perry Mount, PA-C  furosemide (LASIX) 40 MG tablet Take 40 mg by mouth daily as needed (for weight gain of >3lbs in one day.).    Historical Provider, MD  losartan (COZAAR) 50 MG tablet Take 1 tablet (50 mg total) by mouth daily. 11/16/13   Thompson Grayer, MD  traMADol (ULTRAM) 50 MG tablet Take 1 tablet (50 mg total) by mouth every 12 (twelve) hours as needed. 11/09/13   Amy D Ninfa Meeker, NP  triamcinolone (NASACORT) 55 MCG/ACT AERO nasal inhaler Place 2 sprays into both nostrils daily as needed (allergies).     Historical Provider, MD    Allergies: No Known Allergies  History   Social History  . Marital Status: Married    Spouse Name: N/A    Number of Children: N/A  . Years of Education: N/A   Occupational History  . [employer] in [employer]  .     Marland Kitchen      Social History Main Topics  . Smoking status: Current Every Day Smoker -- 0.50 packs/day for 40 years  . Smokeless tobacco: Not on file     Comment: Has cut back to 2-3 cigarettes per day.  . Alcohol Use: Yes     Comment: 1-2 A DAY  . Drug Use: Not on file  . Sexual Activity: Not on file   Other Topics Concern  . Not on file   Social History Narrative   Lives in [place] with wife.      Family History  Problem Relation Age of Onset  . Hyperlipidemia Mother   . Hypertension Mother   . Heart attack Father     Review of Systems: negative for bleeding  issues, nausea, vomiting, focal neurologic symptoms. States he's recovered from his pseudoaneurysm pain. All other systems reviewed and are otherwise negative except as noted above.  Labs:   Lab Results  Component Value Date  WBC 7.7 12/13/2013   HGB 13.7 12/13/2013   HCT 39.9 12/13/2013   MCV 91.5 12/13/2013   PLT 208 12/13/2013     Recent Labs Lab 12/13/13 1549  NA 141  K 3.7  CL 103  CO2 26  BUN 19  CREATININE 0.98  CALCIUM 9.1  GLUCOSE 87  Digoxin level 1.1  Lab Results  Component Value Date   CHOL 162 10/13/2013   HDL 34* 10/13/2013   LDLCALC 113* 10/13/2013   TRIG 74 10/13/2013   Lab Results  Component Value Date   DDIMER 0.30 02/23/2011    Radiology/Studies:  Dg Chest 2 View 12/13/2013   CLINICAL DATA:  Dizziness, shortness of Breath, smoker  EXAM: CHEST  2 VIEW  COMPARISON:  10/04/2013  FINDINGS: Cardiomediastinal silhouette is stable. No acute infiltrate or pleural effusion. No pulmonary edema. Mild hyperinflation and probable chronic mild interstitial prominence stable. Again noted mild degenerative changes thoracic spine.  IMPRESSION: No active cardiopulmonary disease. Stable mild hyperinflation and probable chronic mild interstitial prominence.   Electronically Signed   By: Lahoma Crocker M.D.   On: 12/13/2013 16:28     EKG: sinus bradycardia 51bpm, incomplete RBBB, LPFB, nonspecific ST-T changes  Physical Exam: Blood pressure 147/80, temperature 98.3 F (36.8 C), resp. rate 17, SpO2 99.00%. General: Well developed, well nourished thin WM in no acute distress. Head: Normocephalic, atraumatic, sclera non-icteric, no xanthomas, nares are without discharge.  Neck: Negative for carotid bruits. JVD not elevated. Lungs: Clear bilaterally to auscultation without wheezes, rales, or rhonchi. Breathing is unlabored. Heart: Reg rhythm, bradycardic with S1 S2. No murmurs, rubs, or gallops appreciated. Abdomen: Soft, non-tender, non-distended with normoactive bowel sounds. No  hepatomegaly. No rebound/guarding. No obvious abdominal masses. Msk:  Strength and tone appear normal for age. Extremities: No clubbing or cyanosis. No edema.  Distal pedal pulses are 2+ and equal bilaterally. Neuro: Alert and oriented X 3. No focal deficit. No facial asymmetry. Moves all extremities spontaneously. Psych:  Responds to questions appropriately with a normal affect.    ASSESSMENT AND PLAN:  1. Sinus bradycardia HR 40's 2. Paroxysmal atrial fibrillation/flutter 3. Chronic systolic CHF due to NICM possibly tachy-mediated, currently euvolemic 4. HTN, controlled  Pt has sinus bradycardia without any current symptoms. He would not have necessarily sought care for any particular symptoms today but was told to contact his doctor due to his low heart rate. He endorses occasional mild dizziness but denies this presently. He is hemodynamically stable. Digoxin level is 1.1. Will stop digoxin and cut Coreg to 6.$RemoveBe'25mg'[id number]$  BID. He should continue to monitor vitals outside the hospital (Dr. Percival Spanish taught him how to check pulse). OK for DC from ER with keeping as planned in CHF clinic. Echo is planned in June to reassess EF.  Signed, Dayna Dunn PA-C 12/13/2013, 5:11 PM  History and all data above reviewed.  Patient examined.  I agree with the findings as above.  He had bradycardia but only very minimal symptoms with mild brief dizziness.  He does not have presyncope or syncope.  The patient exam reveals COR:RRR  ,  Lungs: Clear  ,  Abd: Positive bowel sounds, no rebound no guarding, Ext No edema  .  All available labs, radiology testing, previous records reviewed. Agree with documented assessment and plan.  No indication for admission.  He want to go home.  I will stop the dig and reduce the Coreg to 6.25 bid.  I taught him how to take his heart rate and he can  let us know if he has any further symptomatic bradyarrhythmias.      Minus Breeding  5:31 PM  12/13/2013

## 2013-12-13 NOTE — ED Notes (Signed)
Cardiology at bedside.

## 2013-12-13 NOTE — Telephone Encounter (Signed)
Left another message to please return the call to discuss pts condition.

## 2013-12-13 NOTE — Telephone Encounter (Signed)
New message  Pt daughter called state that the pt's heart is slowing down and he is dizzy.Marland Kitchen on call nurse says that it is continually slowing down..Please assist

## 2013-12-13 NOTE — Discharge Instructions (Signed)
Please stop taking your digoxin and please decrease your Coreg (carvedilol) to half a tablet twice daily.  Bradycardia Bradycardia is a term for a heart rate (pulse) that, in adults, is slower than 60 beats per minute. A normal rate is 60 to 100 beats per minute. A heart rate below 60 beats per minute may be normal for some adults with healthy hearts. If the rate is too slow, the heart may have trouble pumping the volume of blood the body needs. If the heart rate gets too low, blood flow to the brain may be decreased and may make you feel lightheaded, dizzy, or faint. The heart has a natural pacemaker in the top of the heart called the SA node (sinoatrial or sinus node). This pacemaker sends out regular electrical signals to the muscle of the heart, telling the heart muscle when to beat (contract). The electrical signal travels from the upper parts of the heart (atria) through the AV node (atrioventricular node), to the lower chambers of the heart (ventricles). The ventricles squeeze, pumping the blood from your heart to your lungs and to the rest of your body. CAUSES   Problem with the heart's electrical system.  Problem with the heart's natural pacemaker.  Heart disease, damage, or infection.  Medications.  Problems with minerals and salts (electrolytes). SYMPTOMS   Fainting (syncope).  Fatigue and weakness.  Shortness of breath (dyspnea).  Chest pain (angina).  Drowsiness.  Confusion. DIAGNOSIS   An electrocardiogram (ECG) can help your caregiver determine the type of slow heart rate you have.  If the cause is not seen on an ECG, you may need to wear a heart monitor that records your heart rhythm for several hours or days.  Blood tests. TREATMENT   Electrolyte supplements.  Medications.  Withholding medication which is causing a slow heart rate.  Pacemaker placement. SEEK IMMEDIATE MEDICAL CARE IF:   You feel lightheaded or faint.  You develop an irregular heart  rate.  You feel chest pain or have trouble breathing. MAKE SURE YOU:   Understand these instructions.  Will watch your condition.  Will get help right away if you are not doing well or get worse. Document Released: 05/24/2002 Document Revised: 11/24/2011 Document Reviewed: 04/19/2008 [facility] Patient Information 2014 [place], [place].

## 2013-12-13 NOTE — Telephone Encounter (Signed)
Spoke with pt's daughter who seems very concerned that pt's HR is "slowing down."  She became very excitable and began to speak very loudly to me as I was attempting to gather information about the pt's condition.   I informed daughter that I really need to speak with the patient or the nurse that is with him to assess further.  Attempted to call the plant nurse and had to leave a message on her voicemail.  Attempted to call the pt on his cell but also had to leave a message for him to call back to discuss s/s.

## 2013-12-13 NOTE — ED Provider Notes (Signed)
CSN: [id number]     Arrival date & time 12/13/13  1533 History   First MD Initiated Contact with Patient 12/13/13 1552     Chief Complaint  Patient presents with  . Dizziness     (Consider location/radiation/quality/duration/timing/severity/associated sxs/prior Treatment) Patient is a 63 y.o. male presenting with dizziness. The history is provided by the patient.  Dizziness Quality:  Lightheadedness Severity:  Mild Onset quality:  Sudden Duration:  1 hour Timing:  Intermittent Progression:  Worsening Chronicity:  New (started 3 days ago) Context: physical activity   Relieved by:  Being still Exacerbated by: physical activity. Associated symptoms: no chest pain, no diarrhea, no headaches, no nausea, no shortness of breath and no vomiting     Past Medical History  Diagnosis Date  . Hypertension   . Atrial flutter     paroxysmal  . Dyslipidemia (high LDL; low HDL)   . Tobacco abuse   . Non-compliance   . Atrial fibrillation with rapid ventricular response   . CHF (congestive heart failure)   . NICM (nonischemic cardiomyopathy)   . Acute systolic CHF (congestive heart failure)   . Anxiety   . Arthritis   . H/O hematuria   . History of inguinal hernia    Past Surgical History  Procedure Laterality Date  . Hernia repair  1992 & 2011  . Cholecystectomy    . Tee without cardioversion N/A 10/17/2013    Procedure: TRANSESOPHAGEAL ECHOCARDIOGRAM (TEE);  Surgeon: Thurmon FairMihai Croitoru, MD;  [facility];  Service: Cardiovascular;  Laterality: N/A;  . Cardioversion N/A 10/17/2013    Procedure: CARDIOVERSION;  Surgeon: Thurmon FairMihai Croitoru, MD;  Location: [facility] ENDOSCOPY;  Service: Cardiovascular;  Laterality: N/A;   Family History  Problem Relation Age of Onset  . Hyperlipidemia Mother   . Hypertension Mother   . Heart attack Father    History  Substance Use Topics  . Smoking status: Current Every Day Smoker -- 0.50 packs/day for 40 years  . Smokeless tobacco: Not on file   Comment: Has cut back to 2-3 cigarettes per day.  . Alcohol Use: Yes     Comment: 1-2 A DAY    Review of Systems  Constitutional: Negative for fever and chills.  Respiratory: Negative for cough and shortness of breath.   Cardiovascular: Negative for chest pain.  Gastrointestinal: Negative for nausea, vomiting, abdominal pain and diarrhea.  Neurological: Positive for dizziness. Negative for seizures, syncope and headaches.  All other systems reviewed and are negative.      Allergies  Review of patient's allergies indicates no known allergies.  Home Medications   Current Outpatient Rx  Name  Route  Sig  Dispense  Refill  . albuterol (PROVENTIL HFA;VENTOLIN HFA) 108 (90 BASE) MCG/ACT inhaler   Inhalation   Inhale 2 puffs into the lungs daily as needed for wheezing or shortness of breath.          . ALPRAZolam (XANAX) 1 MG tablet   Oral   Take 1 mg by mouth daily.          Marland Kitchen. amiodarone (PACERONE) 200 MG tablet   Oral   Take 1 tablet (200 mg total) by mouth daily.   60 tablet   3     Take as directed until follow up appointment where ...   . apixaban (ELIQUIS) 5 MG TABS tablet   Oral   Take 1 tablet (5 mg total) by mouth 2 (two) times daily.   60 tablet   3   .  carvedilol (COREG) 12.5 MG tablet   Oral   Take 12.5 mg by mouth 2 (two) times daily with a meal.         . cyclobenzaprine (FLEXERIL) 5 MG tablet   Oral   Take 5 mg by mouth every evening.         . digoxin (LANOXIN) 0.125 MG tablet   Oral   Take 0.125 mg by mouth daily.         . furosemide (LASIX) 40 MG tablet   Oral   Take 40 mg by mouth daily as needed (for weight gain of >3lbs in one day.).         Marland Kitchen losartan (COZAAR) 50 MG tablet   Oral   Take 1 tablet (50 mg total) by mouth daily.   90 tablet   3   . traMADol (ULTRAM) 50 MG tablet   Oral   Take 1 tablet (50 mg total) by mouth every 12 (twelve) hours as needed.   20 tablet   0   . triamcinolone (NASACORT) 55 MCG/ACT AERO  nasal inhaler   Each Nare   Place 2 sprays into both nostrils daily as needed (allergies).           BP 147/80  Temp(Src) 98.3 F (36.8 C)  Resp 17  SpO2 99% Physical Exam  Nursing note and vitals reviewed. Constitutional: He is oriented to person, place, and time. He appears well-developed and well-nourished. No distress.  HENT:  Head: Normocephalic and atraumatic.  Mouth/Throat: No oropharyngeal exudate.  Eyes: EOM are normal. Pupils are equal, round, and reactive to light.  Neck: Normal range of motion. Neck supple.  Cardiovascular: Normal rate and regular rhythm.  Exam reveals no friction rub.   No murmur heard. Pulmonary/Chest: Effort normal and breath sounds normal. No respiratory distress. He has no wheezes. He has no rales.  Abdominal: He exhibits no distension. There is no tenderness. There is no rebound.  Musculoskeletal: Normal range of motion. He exhibits no edema.  Neurological: He is alert and oriented to person, place, and time. No cranial nerve deficit. He exhibits normal muscle tone. Coordination normal.  Skin: No rash noted. He is not diaphoretic.    ED Course  Procedures (including critical care time) Labs Review Labs Reviewed  CBC  BASIC METABOLIC PANEL  DIGOXIN LEVEL  PRO B NATRIURETIC PEPTIDE  I-STAT TROPOININ, ED   Imaging Review Dg Chest 2 View  12/13/2013   CLINICAL DATA:  Dizziness, shortness of Breath, smoker  EXAM: CHEST  2 VIEW  COMPARISON:  10/04/2013  FINDINGS: Cardiomediastinal silhouette is stable. No acute infiltrate or pleural effusion. No pulmonary edema. Mild hyperinflation and probable chronic mild interstitial prominence stable. Again noted mild degenerative changes thoracic spine.  IMPRESSION: No active cardiopulmonary disease. Stable mild hyperinflation and probable chronic mild interstitial prominence.   Electronically Signed   By: Natasha Mead M.D.   On: 12/13/2013 16:28     EKG Interpretation   Date/Time:  Tuesday December 13 2013  17:14:04 EDT Ventricular Rate:  44 PR Interval:  173 QRS Duration: 119 QT Interval:  560 QTC Calculation: 479 R Axis:   102 Text Interpretation:  Sinus bradycardia Incomplete right bundle branch  block Nonspecific T abnormalities, lateral leads Bradycardia, new, T wave  flattening, new Confirmed by Gwendolyn Grant  MD, Jesse Hirst ([id number]) on 12/13/2013 5:16:53  PM      MDM   Final diagnoses:  Bradycardia    60M presents with dizziness. Intermittent for past 3  days, worsening. Episodes last 1-1.5 hours. Worse with exertion, better with rest. No pre-syncope/syncope, no CP/SOB. Had an episode at work - HR noted by nurse 40-44. Nurse at triage noted HRs in the 30s-50s here. During my exam, HR in the upper 40s, sinus brady. Will check labs, dig level, will have Cards evaluate patient. No recent med change - HR at last Cards office visit 54. Cards evaluated patient, will stop Dig and cut Coreg in half. Stable for discharge.  Dagmar Hait, MD 12/13/13 [phone]

## 2013-12-26 ENCOUNTER — Inpatient Hospital Stay (HOSPITAL_COMMUNITY): Admission: RE | Admit: 2013-12-26 | Payer: PRIVATE HEALTH INSURANCE | Source: Ambulatory Visit

## 2013-12-26 ENCOUNTER — Encounter (HOSPITAL_COMMUNITY): Payer: Self-pay

## 2014-01-19 ENCOUNTER — Encounter (HOSPITAL_COMMUNITY): Payer: PRIVATE HEALTH INSURANCE

## 2014-01-19 ENCOUNTER — Telehealth (HOSPITAL_COMMUNITY): Payer: Self-pay | Admitting: *Deleted

## 2014-01-19 NOTE — Telephone Encounter (Signed)
Pt's wife called very concerned about pt, she states he has been having dizziness and increased fatigue past few days, she states when this occurs he is usually doing really bad, he did go to work today but called her to stay he was so tired he didn't feel like working which she states is very unlike him, she states he has not told her he is SOB but he usually doesn't tell her much, she hasn't noticed much edema and is unsure about his wt.  Appt sch for this afternoon, pt will get off work and come

## 2014-01-31 ENCOUNTER — Encounter (HOSPITAL_COMMUNITY): Payer: Self-pay

## 2014-01-31 ENCOUNTER — Ambulatory Visit (HOSPITAL_COMMUNITY)
Admission: RE | Admit: 2014-01-31 | Discharge: 2014-01-31 | Disposition: A | Discharge status: Home / Self Care | Payer: PRIVATE HEALTH INSURANCE | Source: Ambulatory Visit | Attending: Internal Medicine | Admitting: Internal Medicine

## 2014-01-31 VITALS — BP 139/68 | HR 56 | Resp 18 | Wt 139.2 lb

## 2014-01-31 DIAGNOSIS — I428 Other cardiomyopathies: Secondary | ICD-10-CM | POA: Insufficient documentation

## 2014-01-31 DIAGNOSIS — I480 Paroxysmal atrial fibrillation: Secondary | ICD-10-CM

## 2014-01-31 DIAGNOSIS — I4891 Unspecified atrial fibrillation: Secondary | ICD-10-CM

## 2014-01-31 DIAGNOSIS — R001 Bradycardia, unspecified: Secondary | ICD-10-CM

## 2014-01-31 DIAGNOSIS — Z720 Tobacco use: Secondary | ICD-10-CM

## 2014-01-31 DIAGNOSIS — I498 Other specified cardiac arrhythmias: Secondary | ICD-10-CM

## 2014-01-31 DIAGNOSIS — E785 Hyperlipidemia, unspecified: Secondary | ICD-10-CM | POA: Insufficient documentation

## 2014-01-31 DIAGNOSIS — F411 Generalized anxiety disorder: Secondary | ICD-10-CM | POA: Insufficient documentation

## 2014-01-31 DIAGNOSIS — F17200 Nicotine dependence, unspecified, uncomplicated: Secondary | ICD-10-CM | POA: Insufficient documentation

## 2014-01-31 DIAGNOSIS — I4892 Unspecified atrial flutter: Secondary | ICD-10-CM | POA: Insufficient documentation

## 2014-01-31 DIAGNOSIS — I10 Essential (primary) hypertension: Secondary | ICD-10-CM | POA: Insufficient documentation

## 2014-01-31 DIAGNOSIS — I509 Heart failure, unspecified: Secondary | ICD-10-CM | POA: Insufficient documentation

## 2014-01-31 DIAGNOSIS — I5022 Chronic systolic (congestive) heart failure: Secondary | ICD-10-CM | POA: Insufficient documentation

## 2014-01-31 DIAGNOSIS — Z7901 Long term (current) use of anticoagulants: Secondary | ICD-10-CM | POA: Insufficient documentation

## 2014-01-31 MED ORDER — CARVEDILOL 6.25 MG PO TABS: ORAL_TABLET | ORAL | Status: DC

## 2014-01-31 MED ORDER — LOSARTAN POTASSIUM 50 MG PO TABS: ORAL_TABLET | ORAL | Status: DC

## 2014-01-31 NOTE — Patient Instructions (Signed)
Cut your coreg back to 3.125 mg in the morning and 6.25 mg at night. You currently have 12.5 mg tablets. You can cut them in half to make your evening dose of 6.25 mg, however I sent a new prescription in for you for 6.25 mg tablets so you will cut that one in half to make 3.125 mg for the morning dose.  Increase your losartan to 50 mg (1 tablet) in the morning and 25 mg (1/2 tablet) in the evening.  Come back May 26th 2:45 for labs and ECHO.  Call any increased fatigue or dizziness.  F/U 6 weeks.  Do the following things EVERYDAY: 1) Weigh yourself in the morning before breakfast. Write it down and keep it in a log. 2) Take your medicines as prescribed 3) Eat low salt foods-Limit salt (sodium) to 2000 mg per day.  4) Stay as active as you can everyday 5) Limit all fluids for the day to less than 2 liters 6)

## 2014-01-31 NOTE — Progress Notes (Addendum)
Patient ID: LOISE DOYAL, male   DOB: 08-04-1951, 63 y.o.   MRN: [id number]  PCP: Dr. Laverna Peace Primary Cardiologist: Dr. Gala Romney Primary EP: Dr. Johney Frame  HPI: Mr. Camisa is a pleasant 63 yo male with a history of atrial arrhythmias including PACs, nonsustained atrial flutter, atrial fibrillation, HTN, dyslipidemia, chronic systolic HF and tobacco use. He was seen by Dr. Johney Frame in 2010 prior to hernia surgery and was placed on Cardizem CD 180 mg daily and an aspirin.   He was admitted to [facility] in 10/2013 with  SOB and new onset CHF, EF 10-15% with diff HK complicated by atrial fib/flutter w RVR. He reports he had stopped taking his medications for a few weeks before presenting to the hospital. He underwent L/RHC which showed normal hemodynamics and normal coronaries. He underwent TEE-guided DCCV but had ERAF and amiodarone was started on 10/17/2013. EP saw in consult and recommended he continue amiodarone loading and return for DCCV in 2-3 weeks.   Follow up: Since last visit seen in ED for bradycardia and his coreg was cut back to 6.25 mg BID and digoxin stopped d/t level 1.1. Reports he is still having dizziness after taking his pills in the am and pm and he is very fatigued. Nurse at work checks BP and it was been anywhere from 112-148/40-70s with pulse of 48-55. Denies SOB, CP or edema. Takes frequent rest breaks at work. Able to walk upstairs and go around the whole grocery store without stopping. Has not needed any lasix. +orthopnea sleeps in recliner however more from habit. Works [employer]. Smoking 3-4 cigarettes a day.   10/19/13 K 5.2 Creatinine 1.32.  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  Past Medical History  Diagnosis Date  . Hypertension   . Paroxysmal atrial flutter     paroxysmal  . Dyslipidemia (high LDL; low HDL)    . Tobacco abuse   . Non-compliance     a. Adm 09/2013 after having stopped his meds.  . Paroxysmal atrial fibrillation   . NICM (nonischemic cardiomyopathy)   . Chronic systolic CHF (congestive heart failure)     a. Dx 09/2013 in setting of AF-RVR; EF 10-15%, possibly tachy-mediated. Cath 09/2013: normal cors. b. TEE: EF 20-25% by echo 10/2013.  Marland Kitchen Anxiety   . Arthritis   . H/O hematuria   . History of inguinal hernia   . Premature atrial contractions   . Pseudoaneurysm following procedure     a. RLE pseudoaneurysm after cath, spontaneously decreased in size, did not require compression.  . Sinus bradycardia     a. HR 40's in ER 11/2013 - digoxin stopped, Coreg decreased.    Current Outpatient Prescriptions  Medication Sig Dispense Refill  . ALPRAZolam (XANAX) 1 MG tablet Take 1 mg by mouth 2 (two) times daily.       Marland Kitchen amiodarone (PACERONE) 200 MG tablet Take 1 tablet (200 mg total) by mouth daily.  60 tablet  3  . apixaban (ELIQUIS) 5 MG TABS tablet Take 1 tablet (5 mg total) by mouth 2 (two) times daily.  60 tablet  3  . carvedilol (COREG) 12.5 MG tablet Take 0.5 tablets (6.25 mg total) by mouth 2 (two) times daily with a meal.      . furosemide (LASIX) 40 MG tablet Take 40 mg  by mouth daily as needed (for weight gain of >3lbs in one day.).      Marland Kitchen. ibuprofen (ADVIL,MOTRIN) 200 MG tablet Take 400 mg by mouth every 6 (six) hours as needed for moderate pain.      Marland Kitchen. losartan (COZAAR) 50 MG tablet Take 1 tablet (50 mg total) by mouth daily.  90 tablet  3  . traMADol (ULTRAM) 50 MG tablet Take 1 tablet (50 mg total) by mouth every 12 (twelve) hours as needed.  20 tablet  0  . triamcinolone (NASACORT) 55 MCG/ACT AERO nasal inhaler Place 2 sprays into both nostrils daily as needed (allergies).        No current facility-administered medications for this encounter.    Filed Vitals:   01/31/14 0916  BP: 139/68  Pulse: 56  Resp: 18  Weight: 139 lb 4 oz (63.163 kg)  SpO2: 99%   PHYSICAL  EXAM: General:  Well appearing. No resp difficulty HEENT: normal Neck: supple. JVP 6-7. Carotids 2+ bilaterally; no bruits. No lymphadenopathy or thryomegaly appreciated. Cor: PMI normal. Regular rate & rhythm. No rubs, gallops or  2/6 MR  Lungs: Decreased throughppt Abdomen: soft, nontender, nondistended. No hepatosplenomegaly. No bruits or masses. Good bowel sounds. Extremities: no cyanosis, clubbing, rash, edema Neuro: alert & orientedx3, cranial nerves grossly intact. Moves all 4 extremities w/o difficulty. Affect pleasant.  ASSESSMENT & PLAN:  1) Chronic systolic HF: NICM, EF 20-25% (4/5/40982/10/2013)  - Newly diagnosed HF January 2015  in the setting of atrial fib with RVR. NICM possibley tachy mediated.  - NYHA II symptoms and volume status stable. Will continue PRN lasix.  - With continued fatigue and HR in the 40-50s will cut coreg back again to 3.125 mg q am and 6.25 mg q pm. - Will increase losartan to 50 mg q am and 25 mg q pm. Check BMET next week. - Repeat ECHO next week to assess EF. If remains less than 35% will refer to EP for ICD. QRS narrow no CRT-D. - Reinforced the need and importance of daily weights, a low sodium diet, and fluid restriction (less than 2 L a day). Instructed to call the HF clinic if weight increases more than 3 lbs overnight or 5 lbs in a week.  Will need repeat ECHO .  2) Atrial fib/flutter - Appears to be in NSR. Will continue amiodarone 200 mg daily and eliqius 5 mg BID. Will check TSH and free T4 in 1 week. Could try decreasing amio to 100 mg daily next visit.  3) HTN - Stable. Continue current medications 4) Nicotine use - Reports smoking 1-2 cigarettes a day. Encouraged to quit smoking.   F/U 6 weeks, ECHO next week with labs Aundria RudAli B Toshika Parrow NP-C 9:30 AM

## 2014-02-04 ENCOUNTER — Other Ambulatory Visit (HOSPITAL_COMMUNITY): Payer: Self-pay | Admitting: Physician Assistant

## 2014-02-07 ENCOUNTER — Ambulatory Visit (HOSPITAL_COMMUNITY)
Admission: RE | Admit: 2014-02-07 | Discharge: 2014-02-07 | Disposition: A | Discharge status: Home / Self Care | Payer: PRIVATE HEALTH INSURANCE | Source: Ambulatory Visit | Attending: Internal Medicine | Admitting: Internal Medicine

## 2014-02-07 ENCOUNTER — Ambulatory Visit (HOSPITAL_BASED_OUTPATIENT_CLINIC_OR_DEPARTMENT_OTHER)
Admission: RE | Admit: 2014-02-07 | Discharge: 2014-02-07 | Disposition: A | Discharge status: Home / Self Care | Payer: PRIVATE HEALTH INSURANCE | Source: Ambulatory Visit | Attending: Internal Medicine | Admitting: Internal Medicine

## 2014-02-07 DIAGNOSIS — I509 Heart failure, unspecified: Secondary | ICD-10-CM

## 2014-02-07 DIAGNOSIS — I4891 Unspecified atrial fibrillation: Secondary | ICD-10-CM

## 2014-02-07 DIAGNOSIS — I5022 Chronic systolic (congestive) heart failure: Secondary | ICD-10-CM

## 2014-02-07 LAB — BASIC METABOLIC PANEL
BUN: 15 mg/dL (ref 6–23)
CO2: 29 mEq/L (ref 19–32)
Calcium: 9.4 mg/dL (ref 8.4–10.5)
Chloride: 105 mEq/L (ref 96–112)
Creatinine, Ser: 1.06 mg/dL (ref 0.50–1.35)
GFR calc Af Amer: 85 mL/min — ABNORMAL LOW (ref >90)
GFR calc non Af Amer: 73 mL/min — ABNORMAL LOW (ref >90)
Glucose, Bld: 77 mg/dL (ref 70–99)
Potassium: 4.3 mEq/L (ref 3.7–5.3)
Sodium: 145 mEq/L (ref 137–147)

## 2014-02-07 LAB — T4, FREE: Free T4: 1.4 ng/dL (ref 0.80–1.80)

## 2014-02-07 LAB — TSH: TSH: 1.93 u[IU]/mL (ref 0.350–4.500)

## 2014-02-09 NOTE — Addendum Note (Signed)
Encounter addended by: Aundria Rud, NP on: 02/09/2014  4:36 PM<BR>     Documentation filed: Notes Section

## 2014-02-10 ENCOUNTER — Telehealth (HOSPITAL_COMMUNITY): Payer: Self-pay | Admitting: Anesthesiology

## 2014-02-10 NOTE — Telephone Encounter (Signed)
Reviewed patient's ECHO results and EF up from 20-25% to 45-50%. Left message for patient to call clinic back.   Peter Mclean 3:20 PM

## 2014-02-13 NOTE — Telephone Encounter (Signed)
Pt aware of ECHO and lab results

## 2014-02-20 ENCOUNTER — Other Ambulatory Visit (HOSPITAL_COMMUNITY): Payer: PRIVATE HEALTH INSURANCE

## 2014-03-13 NOTE — Progress Notes (Signed)
Patient ID: Peter Mclean, male   DOB: 1951-01-08, 63 y.o.   MRN: [id number]  PCP: Dr. Laverna Peace Primary Cardiologist: Dr. Gala Romney Primary EP: Dr. Johney Frame  HPI: Mr. Peter Mclean is a pleasant 63 yo male with a history of atrial arrhythmias including PACs, nonsustained atrial flutter, atrial fibrillation, HTN, dyslipidemia, chronic systolic HF and tobacco use. He was seen by Dr. Johney Frame in 2010 prior to hernia surgery and was placed on Cardizem CD 180 mg daily and an aspirin.   He was admitted to [facility] in 10/2013 with  SOB and new onset CHF, EF 10-15% with diff HK complicated by atrial fib/flutter w RVR. He reports he had stopped taking his medications for a few weeks before presenting to the hospital. He underwent L/RHC which showed normal hemodynamics and normal coronaries. He underwent TEE-guided DCCV but had ERAF and amiodarone was started on 10/17/2013. EP saw in consult and recommended he continue amiodarone loading and return for DCCV in 2-3 weeks. Spontaneously cardioverted to NSR.  RHC/LHC 10/13/13: RA 4 RV 22/0 PA 22/10 (14) PCWP 7 Fick CO/CI: 2.96/1.7 Thermo CO/CI: 3.12/1.8 1) No CAD  ECHO 01/2014: EF 45-50%  Follow up for Heart Failure: Last visit coreg cut back to 3.125 mg q am and 6.25 mg q pm. Still having dizziness. Ran off the road twice last week d/t dizziness. Missing a lot of work d/t fatigue. Nurse at work checking BP and 100-130s. Pulse runs between 46-58. Denies SOB, CP, orthopnea or edema. Able to walk upstairs and go around the whole grocery store without stopping. Has not needed any lasix. Still taking frequent rest breaks at work d/t fatigue. Smoking 4-6 cigarettes a day.   10/19/13 K 5.2 Creatinine 1.32.  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  Past  Medical History  Diagnosis Date  . Hypertension   . Paroxysmal atrial flutter     paroxysmal  . Dyslipidemia (high LDL; low HDL)   . Tobacco abuse   . Non-compliance     a. Adm 09/2013 after having stopped his meds.  . Paroxysmal atrial fibrillation   . NICM (nonischemic cardiomyopathy)   . Chronic systolic CHF (congestive heart failure)     a. Dx 09/2013 in setting of AF-RVR; EF 10-15%, possibly tachy-mediated. Cath 09/2013: normal cors. b. TEE: EF 20-25% by echo 10/2013.  Marland Kitchen Anxiety   . Arthritis   . H/O hematuria   . History of inguinal hernia   . Premature atrial contractions   . Pseudoaneurysm following procedure     a. RLE pseudoaneurysm after cath, spontaneously decreased in size, did not require compression.  . Sinus bradycardia     a. HR 40's in ER 11/2013 - digoxin stopped, Coreg decreased.    Current Outpatient Prescriptions  Medication Sig Dispense Refill  . ALPRAZolam (XANAX) 1 MG tablet Take 1 mg by mouth 2 (two) times daily.       Marland Kitchen amiodarone (PACERONE) 200 MG tablet TAKE 1 TABLET BY MOUTH TWICE A DAY  60 tablet  6  . carvedilol (COREG) 6.25 MG tablet Take 3.125 mg (1/2 tablet) in the morning and 6.25 mg (1 tablet) in the evening.  45 tablet  3  . cyclobenzaprine (FLEXERIL) 10 MG tablet Take 10 mg by mouth at bedtime  as needed for muscle spasms.      Marland Kitchen. ELIQUIS 5 MG TABS tablet TAKE 1 TABLET BY MOUTH TWICE A DAY  60 tablet  6  . furosemide (LASIX) 40 MG tablet Take 40 mg by mouth daily as needed (for weight gain of >3lbs in one day.).      Marland Kitchen. ibuprofen (ADVIL,MOTRIN) 200 MG tablet Take 400 mg by mouth every 6 (six) hours as needed for moderate pain.      Marland Kitchen. losartan (COZAAR) 50 MG tablet Take 50 mg (1 tablet) in the morning and 25 mg (1/2 tablet) at night.  45 tablet  3  . traMADol (ULTRAM) 50 MG tablet Take 1 tablet (50 mg total) by mouth every 12 (twelve) hours as needed.  20 tablet  0  . triamcinolone (NASACORT) 55 MCG/ACT AERO nasal inhaler Place 2 sprays into both  nostrils daily as needed (allergies).        No current facility-administered medications for this encounter.    Filed Vitals:   03/14/14 0929  BP: 129/62  Pulse: 55  Resp: 16  Weight: 140 lb (63.504 kg)  SpO2: 99%   PHYSICAL EXAM: General:  Well appearing. No resp difficulty, wife present HEENT: normal Neck: supple. JVP flat. Carotids 2+ bilaterally; no bruits. No lymphadenopathy or thryomegaly appreciated. Cor: PMI normal. Regular rate & rhythm. No rubs, gallops or  2/6 MR  Lungs: Decreased throughppt Abdomen: soft, nontender, nondistended. No hepatosplenomegaly. No bruits or masses. Good bowel sounds. Extremities: no cyanosis, clubbing, rash, edema Neuro: alert & orientedx3, cranial nerves grossly intact. Moves all 4 extremities w/o difficulty. Affect pleasant.  EKG: SB with PVCs 55 bpm  ASSESSMENT & PLAN:  1) Chronic systolic HF: NICM, EF 45-50% (3/29515/2015)  - Newly diagnosed HF January 2015  in the setting of atrial fib with RVR. NICM possibley tachy mediated.  - Patient continues to struggle with NYHA II-III symptoms. He has had increased dizziness and and fatigue. Will cut coreg back to 3.125 mg BID. - SBP stable will continue losartan at current doses.  - Reinforced the need and importance of daily weights, a low sodium diet, and fluid restriction (less than 2 L a day). Instructed to call the HF clinic if weight increases more than 3 lbs overnight or 5 lbs in a week.  2) Atrial fib/flutter - Appears to be in NSR, however patient is still having a lot of fatigue and dizziness. Concerned that he is having runs of Afib/aflutter. Will order 48 holter monitor. Increased Amiodarone to 200 mg BID. No s/s of bleeding will continue Eliquis 5 mg BID. EKG showed SB with PVCs 55 bpm. Has follow up with EP.  3) HTN - Stable. As above cut coreg back to 3.125 mg BID d/t dizziness. 4) Nicotine use - Reports smoking 4-5 cigarettes a day. Encouraged to quit smoking.   F/U 1 month with  MD Ulla Potashosgrove, Raziah Funnell B NP-C 9:36 AM

## 2014-03-14 ENCOUNTER — Encounter (HOSPITAL_COMMUNITY): Payer: Self-pay

## 2014-03-14 ENCOUNTER — Ambulatory Visit (HOSPITAL_COMMUNITY)
Admission: RE | Admit: 2014-03-14 | Discharge: 2014-03-14 | Disposition: A | Discharge status: Home / Self Care | Payer: PRIVATE HEALTH INSURANCE | Source: Ambulatory Visit | Attending: Internal Medicine | Admitting: Internal Medicine

## 2014-03-14 VITALS — BP 129/62 | HR 55 | Resp 16 | Wt 140.0 lb

## 2014-03-14 DIAGNOSIS — Z720 Tobacco use: Secondary | ICD-10-CM

## 2014-03-14 DIAGNOSIS — I5022 Chronic systolic (congestive) heart failure: Secondary | ICD-10-CM

## 2014-03-14 DIAGNOSIS — I4892 Unspecified atrial flutter: Secondary | ICD-10-CM | POA: Insufficient documentation

## 2014-03-14 DIAGNOSIS — I10 Essential (primary) hypertension: Secondary | ICD-10-CM | POA: Insufficient documentation

## 2014-03-14 DIAGNOSIS — I509 Heart failure, unspecified: Secondary | ICD-10-CM | POA: Insufficient documentation

## 2014-03-14 DIAGNOSIS — F17200 Nicotine dependence, unspecified, uncomplicated: Secondary | ICD-10-CM | POA: Insufficient documentation

## 2014-03-14 MED ORDER — AMIODARONE HCL 200 MG PO TABS: 200.0000 mg | ORAL_TABLET | Freq: Two times a day (BID) | ORAL | Status: DC

## 2014-03-14 MED ORDER — CARVEDILOL 3.125 MG PO TABS: ORAL_TABLET | ORAL | Status: DC

## 2014-03-14 NOTE — Patient Instructions (Signed)
Cut your coreg back to 3.125 mg (1/2 tablet) in the morning and 3.125 mg (1/2 tablet) in the evening. When you get your new refill your prescription will be for 3.125 mg tablets and then you will take 1 tablet in the morning and 1 tablet in the evening.  Increase your Amiodarone to 200 mg (1 tablet) in the morning and 200 mg (1 tablet) in the evening.  Will set you up for a Holter Monitor  F/U 1 month  Do the following things EVERYDAY: 1) Weigh yourself in the morning before breakfast. Write it down and keep it in a log. 2) Take your medicines as prescribed 3) Eat low salt foods-Limit salt (sodium) to 2000 mg per day.  4) Stay as active as you can everyday 5) Limit all fluids for the day to less than 2 liters 6)

## 2014-03-15 ENCOUNTER — Telehealth: Payer: Self-pay | Admitting: Cardiology

## 2014-03-15 NOTE — Telephone Encounter (Signed)
I left a message for Huntley Dec to refax form to our office.

## 2014-03-15 NOTE — Telephone Encounter (Signed)
New message          Has Dr Anne Fu received a Base line assessment form from [employer]?

## 2014-03-20 NOTE — Telephone Encounter (Signed)
Paperwork was received but should be completed by [facility].  Most all filled out.  Will have MR send forms to be completed and signed by Dr Arvilla Meres.

## 2014-03-20 NOTE — Telephone Encounter (Signed)
Received fax - will have it completed, signed and faxed.

## 2014-03-21 ENCOUNTER — Other Ambulatory Visit: Payer: Self-pay | Admitting: *Deleted

## 2014-03-21 ENCOUNTER — Encounter (INDEPENDENT_AMBULATORY_CARE_PROVIDER_SITE_OTHER): Payer: PRIVATE HEALTH INSURANCE

## 2014-03-21 ENCOUNTER — Encounter: Payer: Self-pay | Admitting: *Deleted

## 2014-03-21 DIAGNOSIS — I4892 Unspecified atrial flutter: Secondary | ICD-10-CM

## 2014-03-21 DIAGNOSIS — I4891 Unspecified atrial fibrillation: Secondary | ICD-10-CM

## 2014-03-21 DIAGNOSIS — R42 Dizziness and giddiness: Secondary | ICD-10-CM

## 2014-03-21 DIAGNOSIS — I5022 Chronic systolic (congestive) heart failure: Secondary | ICD-10-CM

## 2014-03-21 NOTE — Progress Notes (Signed)
Patient ID: Peter Mclean, male   DOB: [birth year], 63 y.o.   MRN: [id number] E-Cardio 48 hour holter monitor applied to patient.

## 2014-03-23 ENCOUNTER — Ambulatory Visit (INDEPENDENT_AMBULATORY_CARE_PROVIDER_SITE_OTHER): Payer: PRIVATE HEALTH INSURANCE | Admitting: Internal Medicine

## 2014-03-23 ENCOUNTER — Encounter: Payer: Self-pay | Admitting: Internal Medicine

## 2014-03-23 VITALS — BP 133/75 | HR 52 | Ht 67.0 in | Wt 141.4 lb

## 2014-03-23 DIAGNOSIS — I5022 Chronic systolic (congestive) heart failure: Secondary | ICD-10-CM

## 2014-03-23 DIAGNOSIS — I480 Paroxysmal atrial fibrillation: Secondary | ICD-10-CM

## 2014-03-23 DIAGNOSIS — I428 Other cardiomyopathies: Secondary | ICD-10-CM

## 2014-03-23 DIAGNOSIS — I10 Essential (primary) hypertension: Secondary | ICD-10-CM

## 2014-03-23 DIAGNOSIS — I498 Other specified cardiac arrhythmias: Secondary | ICD-10-CM

## 2014-03-23 DIAGNOSIS — R001 Bradycardia, unspecified: Secondary | ICD-10-CM

## 2014-03-23 DIAGNOSIS — I4819 Other persistent atrial fibrillation: Secondary | ICD-10-CM

## 2014-03-23 DIAGNOSIS — I4891 Unspecified atrial fibrillation: Secondary | ICD-10-CM

## 2014-03-23 DIAGNOSIS — I509 Heart failure, unspecified: Secondary | ICD-10-CM

## 2014-03-23 MED ORDER — AMIODARONE HCL 200 MG PO TABS: 200.0000 mg | ORAL_TABLET | Freq: Every day | ORAL | Status: DC

## 2014-03-23 NOTE — Patient Instructions (Signed)
Your physician has recommended you make the following change in your medication:  DECREASE AMIODARONE TO 200 MG ONCE A DAY  Your physician recommends that you schedule a follow-up appointment in: 3 WEEKS WITH DR Lollie Sails, NP

## 2014-03-23 NOTE — Progress Notes (Signed)
PCP:  Pamelia HoitWILSON,Peter HENRY, MD  The patient presents today for routine electrophysiology followup.  He reports ongoing palpitations though these seem short lived and not consistent with his previous afib.  He was evaluated in the CHF clinic and his amiodarone was increased recently from 200 mg once a day to 200 mg twice a day.  He also had a holter monitor placed.  He is a poor historian and it is hard to get a clear clinical picture from him. He states that he frequently feels fatigued.  An echocardiogram was repeated in March of this year with patient being in normal rhythm and showed improvement of his EF from 10-15% to 45-50%.   Today he denies CP, SOB, dizziness, presyncope or syncope.  Past Medical History  Diagnosis Date  . Hypertension   . Paroxysmal atrial flutter     paroxysmal  . Dyslipidemia (high LDL; low HDL)   . Tobacco abuse   . Non-compliance     a. Adm 09/2013 after having stopped his meds.  . Paroxysmal atrial fibrillation   . NICM (nonischemic cardiomyopathy)   . Chronic systolic CHF (congestive heart failure)     a. Dx 09/2013 in setting of AF-RVR; EF 10-15%, possibly tachy-mediated. Cath 09/2013: normal cors. b. TEE: EF 20-25% by echo 10/2013.  Marland Kitchen. Anxiety   . Arthritis   . H/O hematuria   . History of inguinal hernia   . Premature atrial contractions   . Pseudoaneurysm following procedure     a. RLE pseudoaneurysm after cath, spontaneously decreased in size, did not require compression.  . Sinus bradycardia     a. HR 40's in ER 11/2013 - digoxin stopped, Coreg decreased.   Past Surgical History  Procedure Laterality Date  . Hernia repair  1992 & 2011  . Cholecystectomy    . Tee without cardioversion N/A 10/17/2013    Procedure: TRANSESOPHAGEAL ECHOCARDIOGRAM (TEE);  Surgeon: Thurmon FairMihai Croitoru, MD;  [facility];  Service: Cardiovascular;  Laterality: N/A;  . Cardioversion N/A 10/17/2013    Procedure: CARDIOVERSION;  Surgeon: Thurmon FairMihai Croitoru, MD;  Location: [facility]  ENDOSCOPY;  Service: Cardiovascular;  Laterality: N/A;    Current Outpatient Prescriptions  Medication Sig Dispense Refill  . ALPRAZolam (XANAX) 1 MG tablet Take 1 mg by mouth 2 (two) times daily.       Marland Kitchen. amiodarone (PACERONE) 200 MG tablet Take 1 tablet (200 mg total) by mouth daily.  60 tablet  6  . cyclobenzaprine (FLEXERIL) 10 MG tablet Take 10 mg by mouth at bedtime as needed for muscle spasms.      Marland Kitchen. ELIQUIS 5 MG TABS tablet TAKE 1 TABLET BY MOUTH TWICE A DAY  60 tablet  6  . furosemide (LASIX) 40 MG tablet Take 40 mg by mouth daily as needed (for weight gain of >3lbs in one day.).      Marland Kitchen. ibuprofen (ADVIL,MOTRIN) 200 MG tablet Take 400 mg by mouth every 6 (six) hours as needed for moderate pain.      Marland Kitchen. losartan (COZAAR) 50 MG tablet Take 50 mg (1 tablet) in the morning and 25 mg (1/2 tablet) at night.  45 tablet  3  . traMADol (ULTRAM) 50 MG tablet Take 1 tablet (50 mg total) by mouth every 12 (twelve) hours as needed.  20 tablet  0   No current facility-administered medications for this visit.    No Known Allergies  History   Social History  . Marital Status: Married    Spouse Name: N/A  Number of Children: N/A  . Years of Education: N/A   Occupational History  . [employer] in [employer]  .     Marland Kitchen      Social History Main Topics  . Smoking status: Current Every Day Smoker -- 0.50 packs/day for 40 years  . Smokeless tobacco: Not on file     Comment: Has cut back to 2-3 cigarettes per day.  . Alcohol Use: Yes     Comment: 1-2 A DAY  . Drug Use: Not on file  . Sexual Activity: Not on file   Other Topics Concern  . Not on file   Social History Narrative   Lives in [place] with wife.     Family History  Problem Relation Age of Onset  . Hyperlipidemia Mother   . Hypertension Mother   . Heart attack Father     ROS-  All systems are reviewed and are negative except as outlined in the HPI above  Physical Exam: Filed Vitals:    03/23/14 1440  BP: 133/75  Pulse: 52  Height: 5\' 7"  (1.702 m)  Weight: 64.139 kg (141 lb 6.4 oz)    GEN- The patient is well appearing, alert and oriented x 3 today.   Head- normocephalic, atraumatic Eyes-  Sclera clear, conjunctiva pink Ears- hearing intact Oropharynx- clear Neck- supple, no JVP Lymph- no cervical lymphadenopathy Lungs- few crackles right lung base Heart- Regular rate and rhythm, no murmurs, rubs or gallops, PMI not laterally displaced GI- soft, NT, ND, + BS Extremities- no clubbing, cyanosis, edema MS- no significant deformity or atrophy Skin- no rash or lesion Psych- euthymic mood, full affect Neuro- strength and sensation are intact  ekg today reveals sinus rhythm , 60 beats per minute with QTC of 452 ms.  Echocardiogram 5/15 shows EF improved at 45%. Atrium normal in size  Assessment and Plan:  1. Persistent afib The patient appears to be maintaining sinus with amiodarone.  His EF has improved with this. He is appropriately anticoagulated with eliquis.  He has spontaneously converted to sinus rhythm with amiodarone 200mg  daily,  with  recent increase of amiodarone to 200 mg twice a day due to  the patient's complaints of irregular rhythm.  There is question if patient still experiencing breakthrough A. fib. 48-hour monitor is currently being worn.  At this time, I will reduce his amiodarone back to 200 mg a day.  We discussed options of catheter ablation vs continuing amiodarone long term.  At this time he is very clear that he would prefer to continue amiodarone. Once I have the results of his holter, we can make further decisions about management.  If afib is detected then I would recommend ablation.  IF no afib is detected then I may consider implantation of a loop recorder for further afib surveillance. Given his tachycardia mediated cardiomyopathy, I think that it is very important to maintain sinus rhythm long term.  2. Severe biventricular failure/  chronic systolic dysfuction Recent echo is reviewed and has shown improvement in EF  from maintaining sinus rhythm. Today, I explained to the patient the importance of maintaining sinus rhythm long term.    3. Htn, controlled.  No change in medication.  Return in 3 weeks for follow-up

## 2014-03-24 ENCOUNTER — Encounter: Payer: Self-pay | Admitting: Internal Medicine

## 2014-04-12 ENCOUNTER — Ambulatory Visit (INDEPENDENT_AMBULATORY_CARE_PROVIDER_SITE_OTHER): Payer: PRIVATE HEALTH INSURANCE | Admitting: Internal Medicine

## 2014-04-12 ENCOUNTER — Encounter: Payer: Self-pay | Admitting: Internal Medicine

## 2014-04-12 VITALS — BP 154/79 | HR 51 | Ht 66.0 in | Wt 147.0 lb

## 2014-04-12 DIAGNOSIS — I4892 Unspecified atrial flutter: Secondary | ICD-10-CM

## 2014-04-12 DIAGNOSIS — I4819 Other persistent atrial fibrillation: Secondary | ICD-10-CM

## 2014-04-12 DIAGNOSIS — I5022 Chronic systolic (congestive) heart failure: Secondary | ICD-10-CM

## 2014-04-12 DIAGNOSIS — I509 Heart failure, unspecified: Secondary | ICD-10-CM

## 2014-04-12 DIAGNOSIS — I428 Other cardiomyopathies: Secondary | ICD-10-CM

## 2014-04-12 DIAGNOSIS — I4891 Unspecified atrial fibrillation: Secondary | ICD-10-CM

## 2014-04-12 MED ORDER — AMIODARONE HCL 200 MG PO TABS: ORAL_TABLET | ORAL | Status: DC

## 2014-04-12 NOTE — Progress Notes (Signed)
PCP:  Pamelia HoitWILSON,FRED HENRY, MD Amy Clegg. NP, [facility]  The patient presents today for routine electrophysiology followup.  He recently wore  48 hour monitor which showed  ongoing intermittent A. Fib/ flutter with pauses. This correlates with the patient's clinical symptoms of dizziness. He is not aware of the irregular heartbeat. At times he complains of fatigue. Since last visit he has continued to take amiodarone 200 mg twice a day although he was instructed to take once a day. Is reiterated to patient to take it only once a day. Since patient does have h/o  tachycardia mediated cardiomyopathy  maintenance of sinus rhythm is necessary.  An echocardiogram was repeated in March of this year with patient being in normal rhythm and showed improvement of  EF from 10-15% to 45-50%.   Today he denies CP, SOB,  positive for intermittent dizziness, no presyncope or syncope, occasional fatigue.  Past Medical History  Diagnosis Date  . Hypertension   . Paroxysmal atrial flutter     paroxysmal  . Dyslipidemia (high LDL; low HDL)   . Tobacco abuse   . Non-compliance     a. Adm 09/2013 after having stopped his meds.  . Paroxysmal atrial fibrillation   . NICM (nonischemic cardiomyopathy)   . Chronic systolic CHF (congestive heart failure)     a. Dx 09/2013 in setting of AF-RVR; EF 10-15%, possibly tachy-mediated. Cath 09/2013: normal cors. b. TEE: EF 20-25% by echo 10/2013.  Marland Kitchen. Anxiety   . Arthritis   . H/O hematuria   . History of inguinal hernia   . Premature atrial contractions   . Pseudoaneurysm following procedure     a. RLE pseudoaneurysm after cath, spontaneously decreased in size, did not require compression.  . Sinus bradycardia     a. HR 40's in ER 11/2013 - digoxin stopped, Coreg decreased.   Past Surgical History  Procedure Laterality Date  . Hernia repair  1992 & 2011  . Cholecystectomy    . Tee without cardioversion N/A 10/17/2013    Procedure: TRANSESOPHAGEAL  ECHOCARDIOGRAM (TEE);  Surgeon: Thurmon FairMihai Croitoru, MD;  [facility];  Service: Cardiovascular;  Laterality: N/A;  . Cardioversion N/A 10/17/2013    Procedure: CARDIOVERSION;  Surgeon: Thurmon FairMihai Croitoru, MD;  Location: [facility];  Service: Cardiovascular;  Laterality: N/A;    Current Outpatient Prescriptions  Medication Sig Dispense Refill  . ALPRAZolam (XANAX) 1 MG tablet Take 1 mg by mouth 2 (two) times daily.       Marland Kitchen. amiodarone (PACERONE) 200 MG tablet Take 200mg  daily      . ELIQUIS 5 MG TABS tablet TAKE 1 TABLET BY MOUTH TWICE A DAY  60 tablet  6  . furosemide (LASIX) 40 MG tablet Take 40 mg by mouth daily as needed (for weight gain of >3lbs in one day.).      Marland Kitchen. ibuprofen (ADVIL,MOTRIN) 200 MG tablet Take 400 mg by mouth every 6 (six) hours as needed for moderate pain.      Marland Kitchen. losartan (COZAAR) 50 MG tablet Take 50 mg (1 tablet) in the morning and 25 mg (1/2 tablet) at night. (Pt states he is only taking 1/2 tablet BID (04/12/14))       No current facility-administered medications for this visit.    No Known Allergies  History   Social History  . Marital Status: Married    Spouse Name: N/A    Number of Children: N/A  . Years of Education: N/A   Occupational History  .  [employer] in [employer]  .     Marland Kitchen      Social History Main Topics  . Smoking status: Current Every Day Smoker -- 0.50 packs/day for 40 years  . Smokeless tobacco: Not on file     Comment: Has cut back to 2-3 cigarettes per day.  . Alcohol Use: Yes     Comment: 1-2 A DAY  . Drug Use: Not on file  . Sexual Activity: Not on file   Other Topics Concern  . Not on file   Social History Narrative   Lives in [place] with wife.     Family History  Problem Relation Age of Onset  . Hyperlipidemia Mother   . Hypertension Mother   . Heart attack Father     ROS-  All systems are reviewed and are negative except as outlined in the HPI above  Physical Exam: Filed  Vitals:   04/12/14 0949  BP: 154/79  Pulse: 51  Height: 5\' 6"  (1.676 m)  Weight: 66.679 kg (147 lb)    GEN- The patient is well appearing, alert and oriented x 3 today.   Head- normocephalic, atraumatic Eyes-  Sclera clear, conjunctiva pink Ears- hearing intact Oropharynx- clear Neck- supple, no JVP Lymph- no cervical lymphadenopathy Lungs- few crackles right lung base Heart- Regular rate and rhythm, no murmurs, rubs or gallops, PMI not laterally displaced GI- soft, NT, ND, + BS Extremities- no clubbing, cyanosis, edema MS- no significant deformity or atrophy Skin- no rash or lesion Psych- euthymic mood, full affect Neuro- strength and sensation are intact  EKG today- A7sinus bradycardia 51 beats per minute with incomplete right bundle branch block and left posterior fascicular block,  QTC of 416 ms.  Echocardiogram 5/15 shows EF improved at 45%. Atrium normal in size  Ecardio monitor reveals sinus rhythm with episodic A. fib and atrial flutter, with v rates controlled.  Assessment and Plan:  1. Persistent afib/typical appearing flutter Monitor reveals patient is still experiencing breakthrough A. Fib/flutter. At this time, I will reduce his amiodarone back to 200 mg a day.Given his tachycardia mediated cardiomyopathy, I think that it is very important to maintain sinus rhythm long term. Therapeutic strategies for afib including medicine and ablation were discussed in detail with the patient today. Risk, benefits, and alternatives to EP study and radiofrequency ablation for afib were also discussed in detail today. These risks include but are not limited to stroke, bleeding, vascular damage, tamponade, perforation, damage to the esophagus, lungs, and other structures, pulmonary vein stenosis, worsening renal function, and death. The patient understands these risk and wishes to proceed.  We will therefore proceed with catheter ablation, with patient currently on appropriate  anticoagulation with patient reminded not to miss any doses.    2. Severe biventricular failure/ chronic systolic dysfuction Recent echo is reviewed and has shown improvement in EF  from maintaining sinus rhythm. Today, I explained to the patient the importance of maintaining sinus rhythm long term.    3. Htn, not optimally managed.  Patient does follow up in heart failure clinic tomorrow for further management.   I have seen, examined the patient, and reviewed the above assessment and plan with Rudi Coco NP.  Changes to above are made where necessary.  Pt with ongoing afib and atrial flutter despite medical therapy with amiodarone.  Given his CHF, he would likely benefit from sinus rhythm long term.  We will therefore proceed with catheter ablation.  Co Sign:  Hillis Range, MD 04/12/2014 4:08 PM

## 2014-04-12 NOTE — Patient Instructions (Signed)
Your physician has recommended that you have an ablation. Catheter ablation is a medical procedure used to treat some cardiac arrhythmias (irregular heartbeats). During catheter ablation, a long, thin, flexible tube is put into a blood vessel in your groin (upper thigh), or neck. This tube is called an ablation catheter. It is then guided to your heart through the blood vessel. Radio frequency waves destroy small areas of heart tissue where abnormal heartbeats may cause an arrhythmia to start. Please see the instruction sheet given to you today.  Make sure you are taking your Amiodarone once daily

## 2014-04-13 ENCOUNTER — Inpatient Hospital Stay (HOSPITAL_COMMUNITY)
Admission: RE | Admit: 2014-04-13 | Discharge: 2014-04-13 | Disposition: A | Discharge status: Home / Self Care | Payer: PRIVATE HEALTH INSURANCE | Source: Ambulatory Visit

## 2014-05-26 ENCOUNTER — Other Ambulatory Visit: Payer: Self-pay

## 2014-05-26 DIAGNOSIS — I4819 Other persistent atrial fibrillation: Secondary | ICD-10-CM

## 2014-05-26 DIAGNOSIS — Z01812 Encounter for preprocedural laboratory examination: Secondary | ICD-10-CM

## 2014-05-29 ENCOUNTER — Other Ambulatory Visit (HOSPITAL_COMMUNITY): Payer: Self-pay | Admitting: Anesthesiology

## 2014-06-06 ENCOUNTER — Other Ambulatory Visit (INDEPENDENT_AMBULATORY_CARE_PROVIDER_SITE_OTHER): Payer: PRIVATE HEALTH INSURANCE

## 2014-06-06 ENCOUNTER — Encounter (HOSPITAL_COMMUNITY): Payer: Self-pay | Admitting: Pharmacy Technician

## 2014-06-06 DIAGNOSIS — Z01812 Encounter for preprocedural laboratory examination: Secondary | ICD-10-CM

## 2014-06-06 DIAGNOSIS — I4891 Unspecified atrial fibrillation: Secondary | ICD-10-CM

## 2014-06-06 DIAGNOSIS — I4819 Other persistent atrial fibrillation: Secondary | ICD-10-CM

## 2014-06-06 LAB — BASIC METABOLIC PANEL
BUN: 17 mg/dL (ref 6–23)
CO2: 30 mEq/L (ref 19–32)
Calcium: 9.4 mg/dL (ref 8.4–10.5)
Chloride: 102 mEq/L (ref 96–112)
Creatinine, Ser: 1.1 mg/dL (ref 0.4–1.5)
GFR: 68.91 mL/min (ref >60.00)
Glucose, Bld: 94 mg/dL (ref 70–99)
Potassium: 4.7 mEq/L (ref 3.5–5.1)
Sodium: 138 mEq/L (ref 135–145)

## 2014-06-06 LAB — CBC WITH DIFFERENTIAL/PLATELET
Basophils Absolute: 0 10*3/uL (ref 0.0–0.1)
Basophils Relative: 0.4 % (ref 0.0–3.0)
Eosinophils Absolute: 0.3 10*3/uL (ref 0.0–0.7)
Eosinophils Relative: 3.2 % (ref 0.0–5.0)
HCT: 50.1 % (ref 39.0–52.0)
Hemoglobin: 16.6 g/dL (ref 13.0–17.0)
Lymphocytes Relative: 19.1 % (ref 12.0–46.0)
Lymphs Abs: 1.6 10*3/uL (ref 0.7–4.0)
MCHC: 33.1 g/dL (ref 30.0–36.0)
MCV: 93 fl (ref 78.0–100.0)
Monocytes Absolute: 0.7 10*3/uL (ref 0.1–1.0)
Monocytes Relative: 8.6 % (ref 3.0–12.0)
Neutro Abs: 5.8 10*3/uL (ref 1.4–7.7)
Neutrophils Relative %: 68.7 % (ref 43.0–77.0)
Platelets: 205 10*3/uL (ref 150.0–400.0)
RBC: 5.39 Mil/uL (ref 4.22–5.81)
RDW: 14.3 % (ref 11.5–15.5)
WBC: 8.4 10*3/uL (ref 4.0–10.5)

## 2014-06-08 ENCOUNTER — Other Ambulatory Visit (HOSPITAL_COMMUNITY): Payer: Self-pay

## 2014-06-08 MED ORDER — CARVEDILOL 6.25 MG PO TABS: 3.2500 mg | ORAL_TABLET | Freq: Two times a day (BID) | ORAL | Status: DC

## 2014-06-12 ENCOUNTER — Encounter (HOSPITAL_COMMUNITY)
Admission: RE | Disposition: A | Discharge status: Home / Self Care | Payer: Self-pay | Source: Ambulatory Visit | Attending: Internal Medicine

## 2014-06-12 ENCOUNTER — Ambulatory Visit (HOSPITAL_COMMUNITY)
Admission: RE | Admit: 2014-06-12 | Discharge: 2014-06-12 | Disposition: A | Discharge status: Home / Self Care | Payer: PRIVATE HEALTH INSURANCE | Source: Ambulatory Visit | Attending: Internal Medicine | Admitting: Internal Medicine

## 2014-06-12 ENCOUNTER — Encounter (HOSPITAL_COMMUNITY): Payer: Self-pay | Admitting: *Deleted

## 2014-06-12 ENCOUNTER — Encounter: Payer: Self-pay | Admitting: Internal Medicine

## 2014-06-12 DIAGNOSIS — I4891 Unspecified atrial fibrillation: Secondary | ICD-10-CM | POA: Insufficient documentation

## 2014-06-12 DIAGNOSIS — Z9089 Acquired absence of other organs: Secondary | ICD-10-CM | POA: Diagnosis not present

## 2014-06-12 DIAGNOSIS — Z7901 Long term (current) use of anticoagulants: Secondary | ICD-10-CM | POA: Insufficient documentation

## 2014-06-12 DIAGNOSIS — I5022 Chronic systolic (congestive) heart failure: Secondary | ICD-10-CM | POA: Insufficient documentation

## 2014-06-12 DIAGNOSIS — I4892 Unspecified atrial flutter: Secondary | ICD-10-CM | POA: Insufficient documentation

## 2014-06-12 DIAGNOSIS — I059 Rheumatic mitral valve disease, unspecified: Secondary | ICD-10-CM

## 2014-06-12 DIAGNOSIS — Z79899 Other long term (current) drug therapy: Secondary | ICD-10-CM | POA: Insufficient documentation

## 2014-06-12 DIAGNOSIS — Z01812 Encounter for preprocedural laboratory examination: Secondary | ICD-10-CM

## 2014-06-12 DIAGNOSIS — I4949 Other premature depolarization: Secondary | ICD-10-CM | POA: Insufficient documentation

## 2014-06-12 DIAGNOSIS — F17200 Nicotine dependence, unspecified, uncomplicated: Secondary | ICD-10-CM | POA: Diagnosis not present

## 2014-06-12 DIAGNOSIS — I4819 Other persistent atrial fibrillation: Secondary | ICD-10-CM

## 2014-06-12 HISTORY — PX: TEE WITHOUT CARDIOVERSION: SHX5443

## 2014-06-12 SURGERY — ECHOCARDIOGRAM, TRANSESOPHAGEAL
Anesthesia: Moderate Sedation

## 2014-06-12 MED ORDER — FENTANYL CITRATE 0.05 MG/ML IJ SOLN
INTRAMUSCULAR | Status: AC
  Filled 2014-06-12: qty 2

## 2014-06-12 MED ORDER — MIDAZOLAM HCL 5 MG/ML IJ SOLN
INTRAMUSCULAR | Status: AC
  Filled 2014-06-12: qty 2

## 2014-06-12 MED ORDER — SODIUM CHLORIDE 0.9 % IV SOLN
INTRAVENOUS | Status: DC
  Administered 2014-06-12: 500 mL via INTRAVENOUS

## 2014-06-12 MED ORDER — MIDAZOLAM HCL 10 MG/2ML IJ SOLN
INTRAMUSCULAR | Status: DC | PRN
Start: 2014-06-12 — End: 2014-06-12
  Administered 2014-06-12 (×2): 2 mg via INTRAVENOUS

## 2014-06-12 MED ORDER — FENTANYL CITRATE 0.05 MG/ML IJ SOLN
INTRAMUSCULAR | Status: DC | PRN
  Administered 2014-06-12 (×2): 25 ug via INTRAVENOUS

## 2014-06-12 MED ORDER — LIDOCAINE VISCOUS 2 % MT SOLN
OROMUCOSAL | Status: AC
  Filled 2014-06-12: qty 15

## 2014-06-12 MED ORDER — LIDOCAINE VISCOUS 2 % MT SOLN
OROMUCOSAL | Status: DC | PRN
  Administered 2014-06-12: 1 via OROMUCOSAL

## 2014-06-12 MED ORDER — DIPHENHYDRAMINE HCL 50 MG/ML IJ SOLN
INTRAMUSCULAR | Status: AC
  Filled 2014-06-12: qty 1

## 2014-06-12 NOTE — Progress Notes (Signed)
  Echocardiogram Echocardiogram Transesophageal has been performed.  Cathie Beams 06/12/2014, 8:36 AM

## 2014-06-12 NOTE — Op Note (Signed)
LA, LAA without masses.  Small amount of spontaneous contrast in LA appendage  Not fix.  LAA is large. AV mildly thickend  NO AI MV normal  Mild MR TV normal  Trace TR PV normal LVEF appears mildly depressed.   Mild fixed plaque in thoracic aorta

## 2014-06-12 NOTE — Discharge Instructions (Addendum)
Conscious Sedation °Sedation is the use of medicines to promote relaxation and relieve discomfort and anxiety. Conscious sedation is a type of sedation. Under conscious sedation you are less alert than normal but are still able to respond to instructions or stimulation. Conscious sedation is used during short medical and dental procedures. It is milder than deep sedation or general anesthesia and allows you to return to your regular activities sooner.  °LET YOUR HEALTH CARE PROVIDER KNOW ABOUT:  °· Any allergies you have. °· All medicines you are taking, including vitamins, herbs, eye drops, creams, and over-the-counter medicines. °· Use of steroids (by mouth or creams). °· Previous problems you or members of your family have had with the use of anesthetics. °· Any blood disorders you have. °· Previous surgeries you have had. °· Medical conditions you have. °· Possibility of pregnancy, if this applies. °· Use of cigarettes, alcohol, or illegal drugs. °RISKS AND COMPLICATIONS °Generally, this is a safe procedure. However, as with any procedure, problems can occur. Possible problems include: °· Oversedation. °· Trouble breathing on your own. You may need to have a breathing tube until you are awake and breathing on your own. °· Allergic reaction to any of the medicines used for the procedure. °BEFORE THE PROCEDURE °· You may have blood tests done. These tests can help show how well your kidneys and liver are working. They can also show how well your blood clots. °· A physical exam will be done.   °· Only take medicines as directed by your health care provider. You may need to stop taking medicines (such as blood thinners, aspirin, or nonsteroidal anti-inflammatory drugs) before the procedure.   °· Do not eat or drink at least 6 hours before the procedure or as directed by your health care provider. °· Arrange for a responsible adult, family member, or friend to take you home after the procedure. He or she should stay  with you for at least 24 hours after the procedure, until the medicine has worn off. °PROCEDURE  °· An intravenous (IV) catheter will be inserted into one of your veins. Medicine will be able to flow directly into your body through this catheter. You may be given medicine through this tube to help prevent pain and help you relax. °· The medical or dental procedure will be done. °AFTER THE PROCEDURE °· You will stay in a recovery area until the medicine has worn off. Your blood pressure and pulse will be checked.   °·  Depending on the procedure you had, you may be allowed to go home when you can tolerate liquids and your pain is under control. °Document Released: 05/27/2001 Document Revised: 09/06/2013 Document Reviewed: 05/09/2013 °[employer]® Patient Information ©2015 [employer]. This information is not intended to replace advice given to you by your health care provider. Make sure you discuss any questions you have with your health care provider. ° °

## 2014-06-12 NOTE — H&P (Signed)
Primary Physician: Primary Cardiologist:  Allred     HPI:  Peter Mclean is a 63 yo who is followed by [name].  Hx of PAF  Plan for catheter ablation.           Past Medical History  Diagnosis Date  . Hypertension   . Paroxysmal atrial flutter     typical appearing  . Dyslipidemia (high LDL; low HDL)   . Tobacco abuse   . Non-compliance     a. Adm 09/2013 after having stopped his meds.  . Paroxysmal atrial fibrillation   . NICM (nonischemic cardiomyopathy)   . Chronic systolic CHF (congestive heart failure)     a. Dx 09/2013 in setting of AF-RVR; EF 10-15%, possibly tachy-mediated. Cath 09/2013: normal cors. b. TEE: EF 20-25% by echo 10/2013.  Marland Kitchen Anxiety   . Arthritis   . H/O hematuria   . History of inguinal hernia   . Premature atrial contractions   . Pseudoaneurysm following procedure     a. RLE pseudoaneurysm after cath, spontaneously decreased in size, did not require compression.  . Sinus bradycardia     a. HR 40's in ER 11/2013 - digoxin stopped, Coreg decreased.    Medications Prior to Admission  Medication Sig Dispense Refill  . ALPRAZolam (XANAX) 1 MG tablet Take 1 mg by mouth 2 (two) times daily.       Marland Kitchen amiodarone (PACERONE) 200 MG tablet Take  daily      . apixaban (ELIQUIS) 5 MG TABS tablet Take 5 mg by mouth 2 (two) times daily.      . carvedilol (COREG) 6.25 MG tablet Take 0.5 tablets (3.125 mg total) by mouth 2 (two) times daily with a meal.  45 tablet  3  . losartan (COZAAR) 50 MG tablet Take 25-50 mg by mouth 2 (two) times daily. Take 50 mg (1 tablet) in the morning and 25 mg (1/2 tablet) at night.      . furosemide (LASIX) 40 MG tablet Take 40 mg by mouth daily as needed (for weight gain of >3lbs in one day.).      Marland Kitchen ibuprofen (ADVIL,MOTRIN) 200 MG tablet Take 400 mg by mouth every 6 (six) hours as needed for moderate pain.            Infusions: . sodium chloride 500 mL (06/12/14 0754)    No Known Allergies  History   Social History    . Marital Status: Married    Spouse Name: N/A    Number of Children: N/A  . Years of Education: N/A   Occupational History  . [employer] in [employer]  .     Marland Kitchen      Social History Main Topics  . Smoking status: Current Every Day Smoker -- 0.50 packs/day for 40 years  . Smokeless tobacco: Not on file     Comment: Has cut back to 2-3 cigarettes per day.  . Alcohol Use: Yes     Comment: 1-2 A DAY  . Drug Use: Not on file  . Sexual Activity: Not on file   Other Topics Concern  . Not on file   Social History Narrative   Lives in [place] with wife.     Family History  Problem Relation Age of Onset  . Hyperlipidemia Mother   . Hypertension Mother   . Heart attack Father     REVIEW OF SYSTEMS:  All systems reviewed  Negative to the above  problem except as noted above.    PHYSICAL EXAM: Filed Vitals:   06/12/14 0748  BP: 170/91  Resp: 9    No intake or output data in the 24 hours ending 06/12/14 0805  General:  Well appearing. No respiratory difficulty HEENT: normal Neck: supple. no JVD. Carotids 2+ bilat; no bruits. No lymphadenopathy or thryomegaly appreciated. Cor: PMI nondisplaced. Regular rate & rhythm. No rubs, gallops or murmurs. Lungs: clear Abdomen: soft, nontender, nondistended. No hepatosplenomegaly. No bruits or masses. Good bowel sounds. Extremities: no cyanosis, clubbing, rash, edema Neuro: alert & oriented x 3, cranial nerves grossly intact. moves all 4 extremities w/o difficulty. Affect pleasant.   ASSESSMENT:  Patinet presents today for TEE prior to catheter ablation.  Understands risks and benefits  Agrees to proceed.

## 2014-06-13 ENCOUNTER — Encounter (HOSPITAL_COMMUNITY): Payer: PRIVATE HEALTH INSURANCE | Admitting: Certified Registered Nurse Anesthetist

## 2014-06-13 ENCOUNTER — Encounter (HOSPITAL_COMMUNITY): Payer: Self-pay | Admitting: Internal Medicine

## 2014-06-13 ENCOUNTER — Encounter (HOSPITAL_COMMUNITY)
Admission: RE | Disposition: A | Discharge status: Home / Self Care | Payer: Self-pay | Source: Ambulatory Visit | Attending: Internal Medicine

## 2014-06-13 ENCOUNTER — Ambulatory Visit (HOSPITAL_COMMUNITY): Payer: PRIVATE HEALTH INSURANCE | Admitting: Certified Registered Nurse Anesthetist

## 2014-06-13 ENCOUNTER — Ambulatory Visit (HOSPITAL_COMMUNITY)
Admission: RE | Admit: 2014-06-13 | Discharge: 2014-06-15 | Disposition: A | Discharge status: Home / Self Care | Payer: PRIVATE HEALTH INSURANCE | Source: Ambulatory Visit | Attending: Internal Medicine | Admitting: Internal Medicine

## 2014-06-13 DIAGNOSIS — R748 Abnormal levels of other serum enzymes: Secondary | ICD-10-CM | POA: Diagnosis not present

## 2014-06-13 DIAGNOSIS — I5022 Chronic systolic (congestive) heart failure: Secondary | ICD-10-CM | POA: Diagnosis not present

## 2014-06-13 DIAGNOSIS — I428 Other cardiomyopathies: Secondary | ICD-10-CM

## 2014-06-13 DIAGNOSIS — I4819 Other persistent atrial fibrillation: Secondary | ICD-10-CM | POA: Diagnosis present

## 2014-06-13 DIAGNOSIS — I509 Heart failure, unspecified: Secondary | ICD-10-CM | POA: Diagnosis not present

## 2014-06-13 DIAGNOSIS — E785 Hyperlipidemia, unspecified: Secondary | ICD-10-CM | POA: Insufficient documentation

## 2014-06-13 DIAGNOSIS — F17210 Nicotine dependence, cigarettes, uncomplicated: Secondary | ICD-10-CM | POA: Insufficient documentation

## 2014-06-13 DIAGNOSIS — Z9119 Patient's noncompliance with other medical treatment and regimen: Secondary | ICD-10-CM | POA: Diagnosis not present

## 2014-06-13 DIAGNOSIS — Z79899 Other long term (current) drug therapy: Secondary | ICD-10-CM | POA: Diagnosis not present

## 2014-06-13 DIAGNOSIS — F17200 Nicotine dependence, unspecified, uncomplicated: Secondary | ICD-10-CM | POA: Diagnosis not present

## 2014-06-13 DIAGNOSIS — F411 Generalized anxiety disorder: Secondary | ICD-10-CM | POA: Diagnosis not present

## 2014-06-13 DIAGNOSIS — Z7901 Long term (current) use of anticoagulants: Secondary | ICD-10-CM | POA: Diagnosis not present

## 2014-06-13 DIAGNOSIS — I4892 Unspecified atrial flutter: Secondary | ICD-10-CM | POA: Diagnosis not present

## 2014-06-13 DIAGNOSIS — I4891 Unspecified atrial fibrillation: Secondary | ICD-10-CM | POA: Diagnosis present

## 2014-06-13 DIAGNOSIS — I483 Typical atrial flutter: Secondary | ICD-10-CM | POA: Insufficient documentation

## 2014-06-13 DIAGNOSIS — I480 Paroxysmal atrial fibrillation: Secondary | ICD-10-CM | POA: Insufficient documentation

## 2014-06-13 DIAGNOSIS — I110 Hypertensive heart disease with heart failure: Secondary | ICD-10-CM | POA: Diagnosis not present

## 2014-06-13 DIAGNOSIS — I429 Cardiomyopathy, unspecified: Secondary | ICD-10-CM | POA: Insufficient documentation

## 2014-06-13 DIAGNOSIS — Z9089 Acquired absence of other organs: Secondary | ICD-10-CM | POA: Diagnosis not present

## 2014-06-13 DIAGNOSIS — K769 Liver disease, unspecified: Secondary | ICD-10-CM

## 2014-06-13 DIAGNOSIS — Z9049 Acquired absence of other specified parts of digestive tract: Secondary | ICD-10-CM | POA: Insufficient documentation

## 2014-06-13 DIAGNOSIS — F419 Anxiety disorder, unspecified: Secondary | ICD-10-CM | POA: Insufficient documentation

## 2014-06-13 DIAGNOSIS — Z91199 Patient's noncompliance with other medical treatment and regimen due to unspecified reason: Secondary | ICD-10-CM | POA: Diagnosis not present

## 2014-06-13 HISTORY — PX: ABLATION: SHX5711

## 2014-06-13 HISTORY — PX: ATRIAL FIBRILLATION ABLATION: SHX5456

## 2014-06-13 HISTORY — DX: Abnormal levels of other serum enzymes: R74.8

## 2014-06-13 LAB — POCT ACTIVATED CLOTTING TIME
Activated Clotting Time: 304 seconds
Activated Clotting Time: 321 seconds
Activated Clotting Time: 248 seconds
Activated Clotting Time: 123 seconds

## 2014-06-13 LAB — MRSA PCR SCREENING: MRSA by PCR: NEGATIVE

## 2014-06-13 SURGERY — ATRIAL FIBRILLATION ABLATION
Anesthesia: General

## 2014-06-13 MED ORDER — PROTAMINE SULFATE 10 MG/ML IV SOLN
20.0000 mg | Freq: Once | INTRAVENOUS | Status: AC
  Administered 2014-06-13: 20 mg via INTRAVENOUS

## 2014-06-13 MED ORDER — SODIUM CHLORIDE 0.9 % IJ SOLN
3.0000 mL | Freq: Two times a day (BID) | INTRAMUSCULAR | Status: DC
  Administered 2014-06-13 – 2014-06-14 (×3): 3 mL via INTRAVENOUS

## 2014-06-13 MED ORDER — PROTAMINE SULFATE 10 MG/ML IV SOLN
INTRAVENOUS | Status: AC
  Filled 2014-06-13: qty 5

## 2014-06-13 MED ORDER — HYDRALAZINE HCL 20 MG/ML IJ SOLN: 10.0000 mg | Freq: Once | INTRAMUSCULAR | Status: DC

## 2014-06-13 MED ORDER — ALPRAZOLAM 0.5 MG PO TABS
1.0000 mg | ORAL_TABLET | Freq: Two times a day (BID) | ORAL | Status: DC | PRN
  Administered 2014-06-13 – 2014-06-15 (×3): 1 mg via ORAL
  Filled 2014-06-13 (×3): qty 2

## 2014-06-13 MED ORDER — SODIUM CHLORIDE 0.9 % IJ SOLN: 3.0000 mL | INTRAMUSCULAR | Status: DC | PRN

## 2014-06-13 MED ORDER — DIPHENHYDRAMINE HCL 50 MG/ML IJ SOLN
25.0000 mg | Freq: Once | INTRAMUSCULAR | Status: AC
  Administered 2014-06-13: 25 mg via INTRAVENOUS
  Filled 2014-06-13: qty 1

## 2014-06-13 MED ORDER — HEPARIN SODIUM (PORCINE) 1000 UNIT/ML IJ SOLN
INTRAMUSCULAR | Status: AC
  Filled 2014-06-13: qty 1

## 2014-06-13 MED ORDER — APIXABAN 5 MG PO TABS
5.0000 mg | ORAL_TABLET | Freq: Two times a day (BID) | ORAL | Status: DC
  Administered 2014-06-13 – 2014-06-15 (×4): 5 mg via ORAL
  Filled 2014-06-13 (×5): qty 1

## 2014-06-13 MED ORDER — ACETAMINOPHEN 325 MG PO TABS: 650.0000 mg | ORAL_TABLET | ORAL | Status: DC | PRN

## 2014-06-13 MED ORDER — SODIUM CHLORIDE 0.9 % IV SOLN: 250.0000 mL | INTRAVENOUS | Status: DC | PRN

## 2014-06-13 MED ORDER — BUPIVACAINE HCL (PF) 0.25 % IJ SOLN
INTRAMUSCULAR | Status: AC
  Filled 2014-06-13: qty 30

## 2014-06-13 MED ORDER — ONDANSETRON HCL 4 MG/2ML IJ SOLN: 4.0000 mg | Freq: Four times a day (QID) | INTRAMUSCULAR | Status: DC | PRN

## 2014-06-13 MED ORDER — DOBUTAMINE IN D5W 4-5 MG/ML-% IV SOLN
INTRAVENOUS | Status: AC
  Filled 2014-06-13: qty 250

## 2014-06-13 MED ORDER — HYDROCODONE-ACETAMINOPHEN 5-325 MG PO TABS
1.0000 | ORAL_TABLET | ORAL | Status: DC | PRN
  Administered 2014-06-13: 2 via ORAL
  Filled 2014-06-13: qty 2

## 2014-06-13 NOTE — Progress Notes (Signed)
Patient refuses at this time an I and O cath. Reasons for I and O cath and possible complications (bleeding) explained to patient.

## 2014-06-13 NOTE — Progress Notes (Signed)
3 rt femoral venous sheaths removed by DYoung,RN. Site level 0. Manual hold taken over by JJohnson.

## 2014-06-13 NOTE — Progress Notes (Signed)
Protamine 5mg  IV (test dose) given

## 2014-06-13 NOTE — Progress Notes (Signed)
Site area: rt groin Site Prior to Removal:  Level 0 Pressure Applied For:30 minutes Manual:yes    Patient Status During Pull:  stable Post Pull Site:  Level 0 Post Pull Instructions Given:   yes Post Pull Pulses Present: pal pd Dressing Applied:  12:05 Bedrest begins @ 12:05 Comments: rt groin bandage clean and dry, patient resting

## 2014-06-13 NOTE — Progress Notes (Signed)
Dr. Johney Frame in to talk w/patient. Patient has voided total of 500cc uop since in holding area. No longer c/o need to urinate.

## 2014-06-13 NOTE — H&P (Signed)
PCP: Pamelia Hoit, MD   The patient presents today for EPS and ablation. He recently wore 48 hour monitor which showed ongoing intermittent A. Fib/ flutter with pauses. This correlates with the patient's clinical symptoms of dizziness. He is not aware of the irregular heartbeat. At times he complains of fatigue. Since last visit he has continued to take amiodarone.  Since patient does have h/o tachycardia mediated cardiomyopathy maintenance of sinus rhythm is necessary.  An echocardiogram was repeated in March of this year with patient being in normal rhythm and showed improvement of EF from 10-15% to 45-50%.  Today he denies CP, SOB, positive for intermittent dizziness, no presyncope or syncope, occasional fatigue.   Past Medical History   Diagnosis  Date   .  Hypertension    .  Paroxysmal atrial flutter      paroxysmal   .  Dyslipidemia (high LDL; low HDL)    .  Tobacco abuse    .  Non-compliance      a. Adm 09/2013 after having stopped his meds.   .  Paroxysmal atrial fibrillation    .  NICM (nonischemic cardiomyopathy)    .  Chronic systolic CHF (congestive heart failure)      a. Dx 09/2013 in setting of AF-RVR; EF 10-15%, possibly tachy-mediated. Cath 09/2013: normal cors. b. TEE: EF 20-25% by echo 10/2013.   Marland Kitchen  Anxiety    .  Arthritis    .  H/O hematuria    .  History of inguinal hernia    .  Premature atrial contractions    .  Pseudoaneurysm following procedure      a. RLE pseudoaneurysm after cath, spontaneously decreased in size, did not require compression.   .  Sinus bradycardia      a. HR 40's in ER 11/2013 - digoxin stopped, Coreg decreased.    Past Surgical History   Procedure  Laterality  Date   .  Hernia repair   1992 & 2011   .  Cholecystectomy     .  Tee without cardioversion  N/A  10/17/2013     Procedure: TRANSESOPHAGEAL ECHOCARDIOGRAM (TEE); Surgeon: Thurmon Fair, MD; Location: [facility] ENDOSCOPY; Service: Cardiovascular; Laterality: N/A;   .  Cardioversion  N/A   10/17/2013     Procedure: CARDIOVERSION; Surgeon: Thurmon Fair, MD; Location: [facility] ENDOSCOPY; Service: Cardiovascular; Laterality: N/A;    Current Outpatient Prescriptions   Medication  Sig  Dispense  Refill   .  ALPRAZolam (XANAX) 1 MG tablet  Take 1 mg by mouth 2 (two) times daily.     Marland Kitchen  amiodarone (PACERONE) 200 MG tablet  Take 200mg  daily     .  ELIQUIS 5 MG TABS tablet  TAKE 1 TABLET BY MOUTH TWICE A DAY  60 tablet  6   .  furosemide (LASIX) 40 MG tablet  Take 40 mg by mouth daily as needed (for weight gain of >3lbs in one day.).     Marland Kitchen  ibuprofen (ADVIL,MOTRIN) 200 MG tablet  Take 400 mg by mouth every 6 (six) hours as needed for moderate pain.     Marland Kitchen  losartan (COZAAR) 50 MG tablet  Take 50 mg (1 tablet) in the morning and 25 mg (1/2 tablet) at night. (Pt states he is only taking 1/2 tablet BID (04/12/14))      No current facility-administered medications for this visit.   No Known Allergies  History    Social History   .  Marital Status:  Married  Spouse Name:  N/A     Number of Children:  N/A   .  Years of Education:  N/A    Occupational History   .  [employer] in [place]      Machine mechanic   .     Marland Kitchen.      Social History Main Topics   .  Smoking status:  Current Every Day Smoker -- 0.50 packs/day for 40 years   .  Smokeless tobacco:  Not on file      Comment: Has cut back to 2-3 cigarettes per day.   .  Alcohol Use:  Yes      Comment: 1-2 A DAY   .  Drug Use:  Not on file   .  Sexual Activity:  Not on file    Other Topics  Concern   .  Not on file    Social History Narrative    [place] in [place].    Family History   Problem  Relation  Age of Onset   .  Hyperlipidemia  Mother    .  Hypertension  Mother    .  Heart attack  Father    ROS- All systems are reviewed and are negative except as outlined in the HPI above  Physical Exam:  Filed Vitals:   06/13/14 0539  BP: 150/87  Pulse: 47  Temp: 97.9 F (36.6 C)  Resp: 18    GEN-  The patient is well appearing, alert and oriented x 3 today.  Head- normocephalic, atraumatic  Eyes- Sclera clear, conjunctiva pink  Ears- hearing intact  Oropharynx- clear  Neck- supple, no JVP  Lymph- no cervical lymphadenopathy  Lungs- few crackles right lung base  Heart- Regular rate and rhythm, no murmurs, rubs or gallops, PMI not laterally displaced  GI- soft, NT, ND, + BS  Extremities- no clubbing, cyanosis, edema  MS- no significant deformity or atrophy  Skin- no rash or lesion  Psych- euthymic mood, full affect  Neuro- strength and sensation are intact    Assessment and Plan:  1. Persistent afib/typical appearing flutter  Monitor reveals patient is still experiencing breakthrough A. Fib/flutter despite amiodarone therapy.  Therapeutic strategies for afib and atrial flutter including medicine and ablation were discussed in detail with the patient today. Risk, benefits, and alternatives to EP study and radiofrequency ablation for afib were also discussed in detail today. These risks include but are not limited to stroke, bleeding, vascular damage, tamponade, perforation, damage to the esophagus, lungs, and other structures, pulmonary vein stenosis, worsening renal function, radiation exposure and death. The patient understands these risk and wishes to proceed.  He reports compliance with eliquis.

## 2014-06-13 NOTE — Anesthesia Preprocedure Evaluation (Signed)
Anesthesia Evaluation  Patient identified by MRN, date of birth, ID band Patient awake    Reviewed: Allergy & Precautions, H&P , NPO status , Patient's Chart, lab work & pertinent test results  Airway Mallampati: II      Dental  (+) Upper Dentures, Lower Dentures   Pulmonary Current Smoker,          Cardiovascular hypertension, Pt. on medications     Neuro/Psych    GI/Hepatic   Endo/Other    Renal/GU      Musculoskeletal  (+) Arthritis -,   Abdominal   Peds  Hematology   Anesthesia Other Findings   Reproductive/Obstetrics                           Anesthesia Physical Anesthesia Plan  ASA: III  Anesthesia Plan: General   Post-op Pain Management:    Induction: Intravenous  Airway Management Planned: LMA  Additional Equipment:   Intra-op Plan:   Post-operative Plan: Extubation in OR  Informed Consent: I have reviewed the patients History and Physical, chart, labs and discussed the procedure including the risks, benefits and alternatives for the proposed anesthesia with the patient or authorized representative who has indicated his/her understanding and acceptance.   Dental advisory given  Plan Discussed with: Anesthesiologist and CRNA  Anesthesia Plan Comments:         Anesthesia Quick Evaluation

## 2014-06-13 NOTE — Op Note (Signed)
SURGEON:  Hillis Range, MD  PREPROCEDURE DIAGNOSES: 1. Paroxysmal atrial fibrillation. 2. Typical appearing atrial flutter  POSTPROCEDURE DIAGNOSES: 1. Paroxysmal  atrial fibrillation. 2. Typical appearing atrial flutter  PROCEDURES: 1. Comprehensive electrophysiologic study. 2. Coronary sinus pacing and recording. 3. Three-dimensional mapping of atrial fibrillation with additional mapping and ablation of a second discrete focus (atrial flutter) as well as additional right atrial mapping/ ablation 4. Ablation of atrial fibrillation with additional mapping and ablation of a second discrete focus (atrial flutter) as well as additional right atrial mapping/ ablation 5. Intracardiac echocardiography. 6. Transseptal puncture of an intact septum. 7. Rotational Angiography with processing at an independent workstation 8. Arrhythmia induction with pacing with dobutamine infusion  INTRODUCTION:  Peter Mclean is a 63 y.o. male with a history of paroxysmal atrial fibrillation and typical appearing atrial flutter who now presents for EP study and radiofrequency ablation.  The patient reports initially being diagnosed with atrial fibrillation after presenting with symptomatic palpitations and fatgiue. The patient reports increasing frequency and duration of atrial arrhythmias since that time.  The patient has failed medical therapy with amiodarone.  The patient therefore presents today for catheter ablation of atrial fibrillation and atrial flutter.  DESCRIPTION OF PROCEDURE:  Informed written consent was obtained, and the patient was brought to the electrophysiology lab in a fasting state.  The patient was adequately sedated with intravenous medications as outlined in the anesthesia report.  The patient's left and right groins were prepped and draped in the usual sterile fashion by the EP lab staff.  Using a percutaneous Seldinger technique, two 7-French and one 11-French hemostasis sheaths were placed  into the right common femoral vein.    3 Dimensional Rotational Angiography: A 5 french pigtail catheter was introduced through the right common femoral vein and advanced into the inferior venocava.  3 demential rotational angiography was then performed by power injection of 100cc of nonionic contrast.  Reprocessing at an independent work station was then performed.   This demonstrated a moderate sized left atrium with 4 separate pulmonary veins which were also moderate in size.    A 3 dimensional rendering of the left atrium was then merged using [employer] onto the WellPoint system and registered with intracardiac echo (see below).  The pigtail catheter was then removed.  Catheter Placement:  A 7-French [employer] Decapolar coronary sinus catheter was introduced through the right common femoral vein and advanced into the coronary sinus for recording and pacing from this location.  A quadrapolar catheter was introduced through the right common femoral vein and advanced into the right ventricle for recording and pacing.  This catheter was then pulled back to the His bundle location.    Initial Measurements: The patient presented to the electrophysiology lab in sinus rhythm.  The patients PR interval measured 181 msec with a QRS duration of 107 msec and a QT interval of 543 msec.  There were very frequent PACs leading to an effective bradycardia.  The average RR interval was 1250 msec.   With catheter placement, the patient developed sustained atrial fibrillation.  The HV interval measured 44 msec.     Intracardiac Echocardiography: A 10-French [employer] AcuNav intracardiac echocardiography catheter was introduced through the left common femoral vein and advanced into the right atrium. Intracardiac echocardiography was performed of the left atrium, and a three-dimensional anatomical rendering of the left atrium was performed using CARTO sound technology.  The patient was  noted to have a moderate sized left  atrium.  The interatrial septum was prominent but not aneurysmal. All 4 pulmonary veins were visualized and noted to have separate ostia.  There was a small 5th right middle pulmonary vein located just at the ostium of the right inferior pulmonary vein.  The pulmonary veins were moderate in size.  The left atrial appendage was visualized and did not reveal thrombus.   There was no evidence of pulmonary vein stenosis.   Transseptal Puncture: The middle right common femoral vein sheath was exchanged for an 8.5 [place] SL2 transseptal sheath and transseptal access was achieved in a standard fashion using a Brockenbrough needle under biplane fluoroscopy with intracardiac echocardiography confirmation of the transseptal puncture.  Once transseptal access had been achieved, heparin was administered intravenously and intra- arterially in order to maintain an ACT of greater than 300 seconds throughout the procedure.   3D Mapping and Ablation: The His bundle catheter was removed and in its place a 3.5 mm [employer] ablation catheter was advanced into the right atrium.  The transseptal sheath was pulled back into the IVC over a guidewire.  The ablation catheter was advanced across the transseptal hole using the wire as a guide.  The transseptal sheath was then re-advanced over the guidewire into the left atrium.  A duodecapolar Biosense Webster circular mapping catheter was introduced through the transseptal sheath and positioned over the mouth of all 4 pulmonary veins.  Three-dimensional electroanatomical mapping was performed using CARTO technology.  This demonstrated electrical activity within all four pulmonary veins at baseline.  There was incessant activity/ firing from the right superior pulmonary vein which appeared to be the culprit vein. The patient underwent successful sequential electrical isolation and anatomical encircling of all four pulmonary  veins using radiofrequency current with a circular mapping catheter as a guide using a WACA approach.  The right middle pulmonary vein was isolated with the right inferior pulmonary vein.  With isolation of the right superior pulmonary vein, the patient converted to sinus rhythm.  He did not have any additional PACs observed. The ablation catheter was then pulled back into the right atrial and positioned along the cavo-tricuspid isthmus.  Mapping along the atrial side of the isthmus was performed.  This demonstrated a short and vertical isthmus.  A series of radiofrequency applications were then delivered along the isthmus.  Complete bidirectional cavotricuspid isthmus block was achieved as confirmed by differential atrial pacing from the low lateral right atrium.  A stimulus to earliest atrial activation across the isthmus measured 170 msec bi-directionally.  The patient was observe without return of conduction through the isthmus.  The circular mapping catheter was pulled back into the right atrium and 3D mapping was performed at the junction of the superior vena cava and right atrium.  Electrical activity was observed within the SVC.  I therefore elected to perform right atrial ablation in this area.  A series of radiofrequency applications were delivered in a circular fashion around the ostium of the SVC.  Prior to each ablation lesions, pacing was performed from the distal ablation electrode to insure that diaphragmatic stimulation was not observed to avoid phrenic nerve injury.  Diaphragmatic excursion was also observed during ablation.     Measurements Following Ablation: Following ablation, dobutamine was infused up to 10 mcg/min with no inducible atrial fibrillation, atrial tachycardia, atrial flutter, or sustained PACs. In sinus rhythm with RR interval was 858 msec, with PR 214 msec, QRS 116 msec, and Qt 474 msec.  Following ablation the [facility]  interval measured 107 msec with an HV interval of 44 msec.  Ventricular pacing was performed, which revealed VA dissociation when pacing at 600 msec.  Rapid atrial pacing was performed, which revealed an AV Wenckebach cycle length of 510 msec.  Electroisolation was then again confirmed in all four pulmonary veins.  Entrance and exit block was confirmed.  Intracardiac echocardiography was again performed, which revealed no pericardial effusion.  The procedure was therefore considered completed.  All catheters were removed, and the sheaths were aspirated and flushed.  The patient was transferred to the recovery area for sheath removal per protocol.  A limited bedside transthoracic echocardiogram revealed no pericardial effusion. EBL<3210ml.  There were no early apparent complications.  CONCLUSIONS: 1. Sinus rhythm upon presentation.   2. 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  There was a right middle pulmonary vein which was isolated with the right inferior pulmonary vein.  The right superior pulmonary vein appeared to be the culprit vein and afib was terminated with isolation of this vein.  Additional right atrial ablation was performed at the junction of the SVC and RA. 3. Successful electrical isolation and anatomical encircling of all four pulmonary veins with radiofrequency current.    4. Cavo-tricuspid isthmus ablation was performed with complete bidirectional isthmus block achieved.  5. No inducible arrhythmias following ablation both on and off of dobutamine 6. No early apparent complications.   Peter FearingJames Krystelle Prashad,MD 10:37 AM 06/13/2014

## 2014-06-14 ENCOUNTER — Ambulatory Visit (HOSPITAL_COMMUNITY): Payer: PRIVATE HEALTH INSURANCE

## 2014-06-14 DIAGNOSIS — I4892 Unspecified atrial flutter: Secondary | ICD-10-CM | POA: Diagnosis not present

## 2014-06-14 DIAGNOSIS — I509 Heart failure, unspecified: Secondary | ICD-10-CM

## 2014-06-14 DIAGNOSIS — I110 Hypertensive heart disease with heart failure: Secondary | ICD-10-CM

## 2014-06-14 DIAGNOSIS — I4891 Unspecified atrial fibrillation: Secondary | ICD-10-CM

## 2014-06-14 DIAGNOSIS — K769 Liver disease, unspecified: Secondary | ICD-10-CM

## 2014-06-14 DIAGNOSIS — I5022 Chronic systolic (congestive) heart failure: Secondary | ICD-10-CM

## 2014-06-14 DIAGNOSIS — R17 Unspecified jaundice: Secondary | ICD-10-CM

## 2014-06-14 LAB — BASIC METABOLIC PANEL
Anion gap: 10 (ref 5–15)
BUN: 15 mg/dL (ref 6–23)
CO2: 29 mEq/L (ref 19–32)
Calcium: 8.6 mg/dL (ref 8.4–10.5)
Chloride: 104 mEq/L (ref 96–112)
Creatinine, Ser: 1.14 mg/dL (ref 0.50–1.35)
GFR calc Af Amer: 77 mL/min — ABNORMAL LOW (ref >90)
GFR calc non Af Amer: 67 mL/min — ABNORMAL LOW (ref >90)
Glucose, Bld: 109 mg/dL — ABNORMAL HIGH (ref 70–99)
Potassium: 3.8 mEq/L (ref 3.7–5.3)
Sodium: 143 mEq/L (ref 137–147)

## 2014-06-14 LAB — HEPATIC FUNCTION PANEL
ALT: 202 U/L — ABNORMAL HIGH (ref 0–53)
AST: 96 U/L — ABNORMAL HIGH (ref 0–37)
Albumin: 2.9 g/dL — ABNORMAL LOW (ref 3.5–5.2)
Alkaline Phosphatase: 348 U/L — ABNORMAL HIGH (ref 39–117)
Bilirubin, Direct: 6.2 mg/dL — ABNORMAL HIGH (ref 0.0–0.3)
Indirect Bilirubin: 1.9 mg/dL — ABNORMAL HIGH (ref 0.3–0.9)
Total Bilirubin: 8.1 mg/dL — ABNORMAL HIGH (ref 0.3–1.2)
Total Protein: 6 g/dL (ref 6.0–8.3)

## 2014-06-14 MED ORDER — TRAMADOL HCL 50 MG PO TABS
25.0000 mg | ORAL_TABLET | Freq: Four times a day (QID) | ORAL | Status: DC | PRN
  Administered 2014-06-14: 25 mg via ORAL
  Filled 2014-06-14: qty 1

## 2014-06-14 MED ORDER — GADOBENATE DIMEGLUMINE 529 MG/ML IV SOLN
13.0000 mL | Freq: Once | INTRAVENOUS | Status: AC | PRN
  Administered 2014-06-14: 13 mL via INTRAVENOUS

## 2014-06-14 MED ORDER — CARVEDILOL 3.125 MG PO TABS
3.1250 mg | ORAL_TABLET | Freq: Two times a day (BID) | ORAL | Status: DC
  Administered 2014-06-14 – 2014-06-15 (×3): 3.125 mg via ORAL
  Filled 2014-06-14 (×5): qty 1

## 2014-06-14 MED ORDER — LOSARTAN POTASSIUM 50 MG PO TABS
50.0000 mg | ORAL_TABLET | Freq: Every day | ORAL | Status: DC
  Administered 2014-06-14 – 2014-06-15 (×2): 50 mg via ORAL
  Filled 2014-06-14 (×2): qty 1

## 2014-06-14 NOTE — Discharge Summary (Signed)
ELECTROPHYSIOLOGY PROCEDURE DISCHARGE SUMMARY    Patient ID: Peter Mclean,  MRN: [id number], DOB/AGE: [birth year] 63 y.o.  Admit date: 06/13/2014 Discharge date: 06/15/2014  Primary Care Physician: Pamelia Hoit, MD Primary Cardiologist: Bensimhon Electrophysiologist: Hillis Range, MD  Primary Discharge Diagnosis:  1. Atrial fibrillation and atrial flutter status post ablation this admission 2. Abnormal liver enzymes  Secondary Discharge Diagnosis:  1.  Hypertension 2.  NICM 3.  Hyperlipidemia  No Known Allergies  Procedures This Admission:  1.  Electrophysiology study and radiofrequency catheter ablation on 06-13-14 by Dr Hillis Range.  This study demonstrated sinus rhythm upon presentation; rotational Angiography reveals a moderate sized left atrium with four separate pulmonary veins without evidence of pulmonary vein stenosis. There was a right middle pulmonary vein which was isolated with the right inferior pulmonary vein. The right superior pulmonary vein appeared to be the culprit vein and afib was terminated with isolation of this vein. Additional right atrial ablation was performed at the junction of the SVC and RA; successful electrical isolation and anatomical encircling of all four pulmonary veins with radiofrequency current; cavo-tricuspid isthmus ablation was performed with complete bidirectional isthmus block achieved; no inducible arrhythmias following ablation both on and off of dobutamine. There were no early apparent complications.   Brief HPI: Peter Mclean is a 63 y.o. male with a history of paroyxysmal atrial fibrillation and atrial flutter.  They have failed medical therapy with amiodarone. Risks, benefits, and alternatives to catheter ablation of atrial fibrillation were reviewed with the patient who wished to proceed.  The patient underwent TEE prior to the procedure which demonstrated mildly depressed LV function and no LAA thrombus.    Hospital Course:    The patient was admitted and underwent EPS/RFCA of atrial fibrillation and atrial flutter with details as outlined above.  He was monitored on telemetry which demonstrated sinus rhythm with intermittent atrial fibrillation.  Groin was without complication on the day of discharge.  The patient was examined by Dr Johney Frame and considered to be stable for discharge.  Wound care and restrictions were reviewed with the patient.  The patient will be seen back by Rudi Coco, NP in 4 weeks in the AF clinic and Dr Allred in 12 weeks for post ablation follow up.   LFT's were evaluated due to pruritis and bruising.  They were found to be elevated.  GI was consulted who evaluated with MRCP and RUQ ultrasound both of which were normal.  Hepatitis, iron studies, auto-immune studies were drawn but pending at time of discharge.  Dr Elnoria Howard with GI has arranged follow-up lab work and appointment with their office on Monday.  Amiodarone will be discontinued at discharge.  He has been instructed to also follow up with his PCP.   PPI will not be given at discharge per Dr Johney Frame due to liver metabolism.   Discharge Vitals: Blood pressure 127/86, pulse 63, temperature 98.3 F (36.8 C), temperature source Oral, resp. rate 19, height 5\' 7"  (1.702 m), weight 133 lb 14.4 oz (60.737 kg), SpO2 97.00%.   Labs:   Lab Results  Component Value Date   WBC 8.4 06/06/2014   HGB 16.6 06/06/2014   HCT 50.1 06/06/2014   MCV 93.0 06/06/2014   PLT 205.0 06/06/2014     Recent Labs Lab 06/15/14 0610  NA 141  K 4.1  CL 103  CO2 29  BUN 15  CREATININE 1.17  CALCIUM 8.8  PROT 6.3  BILITOT 9.5*  ALKPHOS 446*  ALT  204*  AST 106*  GLUCOSE 89    Discharge Medications:    Medication List    STOP taking these medications       amiodarone 200 MG tablet  Commonly known as:  PACERONE     ibuprofen 200 MG tablet  Commonly known as:  ADVIL,MOTRIN      TAKE these medications       ALPRAZolam 1 MG tablet  Commonly known as:   XANAX  Take 1 mg by mouth 2 (two) times daily as needed for anxiety or sleep.     carvedilol 6.25 MG tablet  Commonly known as:  COREG  Take 0.5 tablets (3.125 mg total) by mouth 2 (two) times daily with a meal.     cholestyramine 4 G packet  Commonly known as:  QUESTRAN  Take 1 packet (4 g total) by mouth 2 (two) times daily.     ELIQUIS 5 MG Tabs tablet  Generic drug:  apixaban  Take 5 mg by mouth 2 (two) times daily.     furosemide 40 MG tablet  Commonly known as:  LASIX  Take 40 mg by mouth daily as needed (for weight gain of >3lbs in one day.).     losartan 50 MG tablet  Commonly known as:  COZAAR  Take 25-50 mg by mouth 2 (two) times daily. Take 50 mg (1 tablet) in the morning and 25 mg (1/2 tablet) at night.        Disposition:  Discharge Instructions   Diet - low sodium heart healthy    Complete by:  As directed      Increase activity slowly    Complete by:  As directed           Follow-up Information   Follow up with CARROLL,DONNA, NP On 07/14/2014. (at 2:30 pm)    Specialty:  Nurse Practitioner   Contact information:   [address] [place] [address] [phone]       Duration of Discharge Encounter: less than 30 minutes including physician time.  Signed,  Leonia ReevesGregg Lindzie Boxx,M.D.

## 2014-06-14 NOTE — Progress Notes (Addendum)
   SUBJECTIVE: The patient is doing well today.  At this time, he denies chest pain, shortness of breath, or any new concerns.  Marland Kitchen apixaban  5 mg Oral BID  . hydrALAZINE  10 mg Intravenous Once  . sodium chloride  3 mL Intravenous Q12H      OBJECTIVE: Physical Exam: Filed Vitals:   06/14/14 0200 06/14/14 0400 06/14/14 0403 06/14/14 0800  BP: 158/97 158/92 158/92   Pulse:   56   Temp:   98.1 F (36.7 C) 98.5 F (36.9 C)  TempSrc:   Oral Oral  Resp: 18 18 19 18   Height:      Weight:      SpO2:   95% 97%    Intake/Output Summary (Last 24 hours) at 06/14/14 1023 Last data filed at 06/14/14 0400  Gross per 24 hour  Intake    703 ml  Output    605 ml  Net     98 ml    Telemetry reveals sinus rhythm  GEN- The patient is well appearing, alert and oriented x 3 today.   Head- normocephalic, atraumatic Eyes-  Sclera clear, conjunctiva pink Ears- hearing intact Oropharynx- clear Neck- supple, no JVP Lungs- Clear to ausculation bilaterally, normal work of breathing Heart- Regular rate and rhythm, no murmurs, rubs or gallops, PMI not laterally displaced GI- soft, NT, ND, + BS Extremities- no clubbing, cyanosis, or edema, no hematoma/ bruit Skin- + jaundice Psych- euthymic mood, full affect Neuro- strength and sensation are intact  LABS: Basic Metabolic Panel:  Recent Labs  03/10/93 0333  NA 143  K 3.8  CL 104  CO2 29  GLUCOSE 109*  BUN 15  CREATININE 1.14  CALCIUM 8.6   Liver Function Tests:  Recent Labs  06/14/14 0333  AST 96*  ALT 202*  ALKPHOS 348*  BILITOT 8.1*  PROT 6.0  ALBUMIN 2.9*   RADIOLOGY: No results found.  ASSESSMENT AND PLAN:  Active Problems:   Paroxysmal atrial flutter   Persistent atrial fibrillation   Atrial fibrillation  1. Afib Doing well s/p ablation Maintaining sinus rhythm off of amiodarone.  Given elevated LFTs (see below) will stop amiodarone at this time. He cannot miss eliquis.  2. Jaundice, elevated bilirubin,  liver failure Given prior itching, bruising, and jaundice, LFTs were ordered which reveal liver failure.  This is a new diagnosis.  RUQ in 2012 is reviewed and did not suggest cirrhosis.  I have spoken with PCP Benedetto Goad) this am who says that patient was given decadron IM for allergies last week.  This seems unlikely to be the cause for his liver failure. Amiodarone has been discontinued.  RUQ [place] is ordered. I have spoken with Dr Elnoria Howard who will see the patient today.  He is initially suspicious for a retained stone. Unfortunately given ablation performed, he cannot hold anticoagulation for at least 8 weeks post ablation.  Ideally, should remain on eliquis for at least 3 months.  3. Chronic systolic dysfunction Stable No change required today  4. Hypertensive cardiovascular disease Elevated BP Will restart home medicine  Transfer to telemetry OK to discharge from a CV standpoint once GI workup is complete.

## 2014-06-14 NOTE — Consult Note (Signed)
Reason for Consult: Jaundice Referring Physician: Cardiology  Eloy Endavid A Cristiano HPI: This is a 63 year old Mclean with a PMH of atrial flutter, HTN, hyperlipidemia, and CHF admitted electively for ablation for his aflutter/afib.  The patient tolerated the procedure well, but he was noted to be jaundiced.  Further evaluation of his liver enzymes revealed that it was in an obstructive pattern.  The patient had elevated liver enzymes in the past and it was secondary to symptomatic cholelithiasis.  His gallbladder was removed without any issues.  His jaundiced started after he presented to Dr. Tawana ScaleWilson's office for complaints of sinus issues.  He was prescribed fluticasone and Claritin.  The patient has a long history of ETOH use, i.e., for the past 30 years.  He reports that he can drink heavily, but it is not routine for him and he does not get drunk.    Past Medical History  Diagnosis Date  . Hypertension   . Paroxysmal atrial flutter     typical appearing  . Dyslipidemia (high LDL; low HDL)   . Tobacco abuse   . Non-compliance     a. Adm 09/2013 after having stopped his meds.  . Paroxysmal atrial fibrillation   . NICM (nonischemic cardiomyopathy)   . Chronic systolic CHF (congestive heart failure)     a. Dx 09/2013 in setting of AF-RVR; EF 10-15%, possibly tachy-mediated. Cath 09/2013: normal cors. b. TEE: EF 20-25% by echo 10/2013.  Marland Kitchen. Anxiety   . Arthritis   . H/O hematuria   . History of inguinal hernia   . Premature atrial contractions   . Pseudoaneurysm following procedure     a. RLE pseudoaneurysm after cath, spontaneously decreased in size, did not require compression.  . Sinus bradycardia     a. HR 40's in ER 11/2013 - digoxin stopped, Coreg decreased.    Past Surgical History  Procedure Laterality Date  . Hernia repair  1992 & 2011  . Cholecystectomy    . Tee without cardioversion N/A 10/17/2013    Procedure: TRANSESOPHAGEAL ECHOCARDIOGRAM (TEE);  Surgeon: Thurmon FairMihai Croitoru, MD;  [facility];  Service: Cardiovascular;  Laterality: N/A;  . Cardioversion N/A 10/17/2013    Procedure: CARDIOVERSION;  Surgeon: Thurmon FairMihai Croitoru, MD;  Location: [facility] ENDOSCOPY;  Service: Cardiovascular;  Laterality: N/A;  . Tee without cardioversion N/A 06/12/2014    Procedure: TRANSESOPHAGEAL ECHOCARDIOGRAM (TEE);  Surgeon: Pricilla RifflePaula Ross V, MD;  [facility];  Service: Cardiovascular;  Laterality: N/A;    Family History  Problem Relation Age of Onset  . Hyperlipidemia Mother   . Hypertension Mother   . Heart attack Father     Social History:  reports that he has been smoking.  He does not have any smokeless tobacco history on file. He reports that he drinks alcohol. His drug history is not on file.  Allergies: No Known Allergies  Medications:  Scheduled: . apixaban  5 mg Oral BID  . carvedilol  3.125 mg Oral BID WC  . hydrALAZINE  10 mg Intravenous Once  . losartan  50 mg Oral Daily  . sodium chloride  3 mL Intravenous Q12H   Continuous:   Results for orders placed during the hospital encounter of 06/13/14 (from the past 24 hour(s))  MRSA PCR SCREENING     Status: None   Collection Time    06/13/14 12:55 PM      Result Value Ref Range   MRSA by PCR NEGATIVE  NEGATIVE  BASIC METABOLIC PANEL  Status: Abnormal   Collection Time    06/14/14  3:33 AM      Result Value Ref Range   Sodium 143  137 - 147 mEq/L   Potassium 3.8  3.7 - 5.3 mEq/L   Chloride 104  96 - 112 mEq/L   CO2 29  19 - 32 mEq/L   Glucose, Bld 109 (*) 70 - 99 mg/dL   BUN 15  6 - 23 mg/dL   Creatinine, Ser 2.91  0.50 - 1.35 mg/dL   Calcium 8.6  8.4 - 91.6 mg/dL   GFR calc non Af Amer 67 (*) >90 mL/min   GFR calc Af Amer 77 (*) >90 mL/min   Anion gap 10  5 - 15  HEPATIC FUNCTION PANEL     Status: Abnormal   Collection Time    06/14/14  3:33 AM      Result Value Ref Range   Total Protein 6.0  6.0 - 8.3 g/dL   Albumin 2.9 (*) 3.5 - 5.2 g/dL   AST 96 (*) 0 - 37 U/L   ALT 202 (*) 0 - 53 U/L   Alkaline  Phosphatase 348 (*) 39 - 117 U/L   Total Bilirubin 8.1 (*) 0.3 - 1.2 mg/dL   Bilirubin, Direct 6.2 (*) 0.0 - 0.3 mg/dL   Indirect Bilirubin 1.9 (*) 0.3 - 0.9 mg/dL     No results found.  ROS:  As stated above in the HPI otherwise negative.  Blood pressure 161/126, pulse 56, temperature 98.1 F (36.7 C), temperature source Oral, resp. rate 18, height 5\' 7"  (1.702 m), weight 64.864 kg (143 lb), SpO2 95.00%.    PE: Gen: NAD, Alert and Oriented HEENT:  [place]/AT, EOMI Neck: Supple, no LAD Lungs: CTA Bilaterally CV: RRR without M/G/R ABM: Soft, NTND, +BS Ext: No C/C/E, palmar erythema. Skin: spider angiomas on the chest  Assessment/Plan: 1) Probable retained stone. 2) Jaundice. 3) Abnormal liver enzymes. 4) S/p cardiac ablation for Afib/Flutter   I am thinking that he has a retained stone, but I did see some signs that can point to cirrhosis.  He may have decompensated cirrhosis with the elevation of his TB and liver enzymes.  I reviewed his medication history and I cannot identify a source to explain the elevated liver enzymes.  Imaging will be helpful in this instance.  Plan: 1) MRCP.  If it is positive choledocholithiasis an ERCP will be performed. 2) Follow liver panel.  Khole Branch D 06/14/2014, 12:36 PM

## 2014-06-15 DIAGNOSIS — I429 Cardiomyopathy, unspecified: Secondary | ICD-10-CM | POA: Diagnosis not present

## 2014-06-15 DIAGNOSIS — Z9119 Patient's noncompliance with other medical treatment and regimen: Secondary | ICD-10-CM | POA: Diagnosis not present

## 2014-06-15 DIAGNOSIS — I480 Paroxysmal atrial fibrillation: Secondary | ICD-10-CM

## 2014-06-15 DIAGNOSIS — F419 Anxiety disorder, unspecified: Secondary | ICD-10-CM | POA: Diagnosis not present

## 2014-06-15 DIAGNOSIS — Z79899 Other long term (current) drug therapy: Secondary | ICD-10-CM | POA: Diagnosis not present

## 2014-06-15 DIAGNOSIS — I5022 Chronic systolic (congestive) heart failure: Secondary | ICD-10-CM | POA: Diagnosis not present

## 2014-06-15 DIAGNOSIS — I483 Typical atrial flutter: Secondary | ICD-10-CM | POA: Diagnosis not present

## 2014-06-15 DIAGNOSIS — K769 Liver disease, unspecified: Secondary | ICD-10-CM

## 2014-06-15 DIAGNOSIS — I110 Hypertensive heart disease with heart failure: Secondary | ICD-10-CM | POA: Diagnosis not present

## 2014-06-15 DIAGNOSIS — E785 Hyperlipidemia, unspecified: Secondary | ICD-10-CM | POA: Diagnosis not present

## 2014-06-15 DIAGNOSIS — R748 Abnormal levels of other serum enzymes: Secondary | ICD-10-CM | POA: Diagnosis not present

## 2014-06-15 DIAGNOSIS — F17210 Nicotine dependence, cigarettes, uncomplicated: Secondary | ICD-10-CM | POA: Diagnosis not present

## 2014-06-15 DIAGNOSIS — Z7901 Long term (current) use of anticoagulants: Secondary | ICD-10-CM | POA: Diagnosis not present

## 2014-06-15 DIAGNOSIS — Z9049 Acquired absence of other specified parts of digestive tract: Secondary | ICD-10-CM | POA: Diagnosis not present

## 2014-06-15 LAB — COMPREHENSIVE METABOLIC PANEL
ALT: 204 U/L — ABNORMAL HIGH (ref 0–53)
AST: 106 U/L — ABNORMAL HIGH (ref 0–37)
Albumin: 2.9 g/dL — ABNORMAL LOW (ref 3.5–5.2)
Alkaline Phosphatase: 446 U/L — ABNORMAL HIGH (ref 39–117)
Anion gap: 9 (ref 5–15)
BUN: 15 mg/dL (ref 6–23)
CO2: 29 mEq/L (ref 19–32)
Calcium: 8.8 mg/dL (ref 8.4–10.5)
Chloride: 103 mEq/L (ref 96–112)
Creatinine, Ser: 1.17 mg/dL (ref 0.50–1.35)
GFR calc Af Amer: 75 mL/min — ABNORMAL LOW (ref >90)
GFR calc non Af Amer: 65 mL/min — ABNORMAL LOW (ref >90)
Glucose, Bld: 89 mg/dL (ref 70–99)
Potassium: 4.1 mEq/L (ref 3.7–5.3)
Sodium: 141 mEq/L (ref 137–147)
Total Bilirubin: 9.5 mg/dL — ABNORMAL HIGH (ref 0.3–1.2)
Total Protein: 6.3 g/dL (ref 6.0–8.3)

## 2014-06-15 LAB — HEPATITIS B CORE ANTIBODY, IGM: Hep B C IgM: NONREACTIVE

## 2014-06-15 LAB — HEPATITIS B SURFACE ANTIBODY,QUALITATIVE: Hep B S Ab: NEGATIVE

## 2014-06-15 LAB — IRON AND TIBC
Iron: 133 ug/dL (ref 42–135)
Saturation Ratios: 44 % (ref 20–55)
TIBC: 302 ug/dL (ref 215–435)
UIBC: 169 ug/dL (ref 125–400)

## 2014-06-15 LAB — PROTIME-INR
INR: 1 (ref 0.00–1.49)
Prothrombin Time: 13.2 seconds (ref 11.6–15.2)

## 2014-06-15 LAB — HEPATITIS B SURFACE ANTIGEN: Hepatitis B Surface Ag: NEGATIVE

## 2014-06-15 LAB — HEPATITIS C ANTIBODY: HCV Ab: NEGATIVE

## 2014-06-15 LAB — FERRITIN: Ferritin: 432 ng/mL — ABNORMAL HIGH (ref 22–322)

## 2014-06-15 LAB — HEPATITIS B CORE ANTIBODY, TOTAL: Hep B Core Total Ab: NONREACTIVE

## 2014-06-15 MED ORDER — CHOLESTYRAMINE 4 G PO PACK: 4.0000 g | PACK | Freq: Two times a day (BID) | ORAL | Status: DC

## 2014-06-15 NOTE — Progress Notes (Signed)
Subjective: Feeling well.  Mild pruritis.  Objective: Vital signs in last 24 hours: Temp:  [97.7 F (36.5 C)-98.3 F (36.8 C)] 98.3 F (36.8 C) (10/01 0553) Pulse Rate:  [63-103] 63 (10/01 0553) Resp:  [15-19] 19 (10/01 0553) BP: (127-162)/(81-100) 127/86 mmHg (10/01 0553) SpO2:  [97 %-99 %] 97 % (10/01 0553) Weight:  [60.737 kg (133 lb 14.4 oz)-61.508 kg (135 lb 9.6 oz)] 60.737 kg (133 lb 14.4 oz) (10/01 0553) Last BM Date: 06/14/14  Intake/Output from previous day: 09/30 0701 - 10/01 0700 In: 240 [P.O.:240] Out: -  Intake/Output this shift:    General appearance: alert, no distress and oriented GI: soft, non-tender; bowel sounds normal; no masses,  no organomegaly Extremities: no asterixis  Lab Results: No results found for this basename: WBC, HGB, HCT, PLT,  in the last 72 hours BMET  Recent Labs  06/14/14 0333 06/15/14 0610  NA 143 141  K 3.8 4.1  CL 104 103  CO2 29 29  GLUCOSE 109* 89  BUN 15 15  CREATININE 1.14 1.17  CALCIUM 8.6 8.8   LFT  Recent Labs  06/14/14 0333 06/15/14 0610  PROT 6.0 6.3  ALBUMIN 2.9* 2.9*  AST 96* 106*  ALT 202* 204*  ALKPHOS 348* 446*  BILITOT 8.1* 9.5*  BILIDIR 6.2*  --   IBILI 1.9*  --    PT/INR  Recent Labs  06/15/14 0900  LABPROT 13.2  INR 1.00   Hepatitis Panel No results found for this basename: HEPBSAG, HCVAB, HEPAIGM, HEPBIGM,  in the last 72 hours C-Diff No results found for this basename: CDIFFTOX,  in the last 72 hours Fecal Lactopherrin No results found for this basename: FECLLACTOFRN,  in the last 72 hours  Studies/Results: Mr 3d Recon At Scanner  06/14/2014   CLINICAL DATA:  Jaundice.  EXAM: MRI ABDOMEN WITHOUT AND WITH CONTRAST (INCLUDING MRCP)  TECHNIQUE: Multiplanar multisequence MR imaging of the abdomen was performed both before and after the administration of intravenous contrast. Heavily T2-weighted images of the biliary and pancreatic ducts were obtained, and three-dimensional MRCP images  were rendered by post processing.  CONTRAST:  75mL MULTIHANCE GADOBENATE DIMEGLUMINE 529 MG/ML IV SOLN  COMPARISON:  None  FINDINGS: Lower chest: Dependent changes are identified within both lung bases.  Hepatobiliary: There is no focal liver abnormality identified. Mild intrahepatic biliary prominence. Previous cholecystectomy. The common bile duct measures up to 8.1 mm. There is no common bile duct stone.  Pancreas: Normal appearance of the pancreas. The pancreatic duct has a normal caliber.  Spleen: Normal appearance of the spleen.  Adrenals/Urinary Tract: The adrenal glands are both normal. The kidneys are both unremarkable.  Vascular/Lymphatic: Normal caliber of the abdominal aorta. No retroperitoneal adenopathy identified.  Other: There is no free fluid identified within the upper abdomen.  Musculoskeletal: Normal signal from within the bone marrow.  IMPRESSION: 1. No acute findings identified. 2. The common bile duct is within normal limits for a patient that is status post cholecystectomy measuring 8.1 mm. There is no evidence for choledocholithiasis or obstructing mass.   Electronically Signed   By: Signa Kell M.D.   On: 06/14/2014 18:24   Mr Jorja Loa Cm/mrcp  06/14/2014   CLINICAL DATA:  Jaundice.  EXAM: MRI ABDOMEN WITHOUT AND WITH CONTRAST (INCLUDING MRCP)  TECHNIQUE: Multiplanar multisequence MR imaging of the abdomen was performed both before and after the administration of intravenous contrast. Heavily T2-weighted images of the biliary and pancreatic ducts were obtained, and three-dimensional MRCP images were  rendered by post processing.  CONTRAST:  13mL MULTIHANCE GADOBENATE DIMEGLUMINE 529 MG/ML IV SOLN  COMPARISON:  None  FINDINGS: Lower chest: Dependent changes are identified within both lung bases.  Hepatobiliary: There is no focal liver abnormality identified. Mild intrahepatic biliary prominence. Previous cholecystectomy. The common bile duct measures up to 8.1 mm. There is no common bile  duct stone.  Pancreas: Normal appearance of the pancreas. The pancreatic duct has a normal caliber.  Spleen: Normal appearance of the spleen.  Adrenals/Urinary Tract: The adrenal glands are both normal. The kidneys are both unremarkable.  Vascular/Lymphatic: Normal caliber of the abdominal aorta. No retroperitoneal adenopathy identified.  Other: There is no free fluid identified within the upper abdomen.  Musculoskeletal: Normal signal from within the bone marrow.  IMPRESSION: 1. No acute findings identified. 2. The common bile duct is within normal limits for a patient that is status post cholecystectomy measuring 8.1 mm. There is no evidence for choledocholithiasis or obstructing mass.   Electronically Signed   By: Signa Kellaylor  Stroud M.D.   On: 06/14/2014 18:24   [place] Abdomen Limited Ruq  06/14/2014   CLINICAL DATA:  Elevated LFTs, suspected cirrhosis  EXAM: US ABDOMEN LIMITED - RIGHT UPPER QUADRANT  COMPARISON:  MRI abdomen dated 06/14/2014  FINDINGS: Gallbladder:  Surgically absent.  Common bile duct:  Diameter: 5 mm.  Liver:  No focal lesion identified. Within normal limits in parenchymal echogenicity.  IMPRESSION: Status post cholecystectomy.   Electronically Signed   By: Charline BillsSriyesh  Krishnan M.D.   On: 06/14/2014 23:07    Medications:  Scheduled: . apixaban  5 mg Oral BID  . carvedilol  3.125 mg Oral BID WC  . hydrALAZINE  10 mg Intravenous Once  . losartan  50 mg Oral Daily  . sodium chloride  3 mL Intravenous Q12H   Continuous:   Assessment/Plan: 1) Abnormal liver enzymes. 2) Jaundice.   The patient's MRI/MRCP was normal.  No evidence of any stones, strictures, or masses.  I also reviewed the scan results with Radiology and there is no evidence of steatosis or cirrhosis to support the physical exam findings, i.e., spider angiomas and palmar erythema.  I conducted an extensive drug review with him and I am unable to find a culprit that would explain the current findings.  He was on amoxicillin,  but this medication does not coincide with his current presentation.  Amoxicillin/Augmentin is well-known to produce an obstructive liver pattern.  Claritin is known to cause some liver issues, but it is in a hepatocellular pattern.  It is still a possibility, but I am doubtful that this is the cause.  No reported use of over-the-counter medications containing Tylenol that I can discern.  I did order viral serologies as well as some other serologies to evaluate for alpha-1-antitrypsin disease, hemochromatosis, and autoimmune disease.  I think these latter etiologies are less likely, but it is worthwhile to check.  His INR is normal and there is no evidence of encephalopathy.  He can be discharged today and I will have him follow up in the office on Monday for repeat blood work.  I informed his daughter that he needs to return to the hospital if he develops encephalopathy.    Plan: 1) Okay to D/C Home. 2) Start on cholesytramine 4 grams BID to help with the pruritis.  3) Follow up in the office on Monday for repeat blood work.  I will make the arrangements.  LOS: 2 days   Raylee Strehl D 06/15/2014, 12:44 PM

## 2014-06-15 NOTE — Progress Notes (Addendum)
Pt discharged to home per MD order. Pt received and reviewed all discharge instructions and medication information including follow-up appointments and prescription information. Pt verbalized understanding. Pt alert and oriented at discharge with no complaints of pain. Pt IV and telemetry box removed prior to discharge. Pt escorted to private vehicle via wheelchair by nurse tech. Peter Mclean C  

## 2014-06-15 NOTE — Progress Notes (Signed)
SUBJECTIVE: The patient is doing well today.  At this time, he denies chest pain, shortness of breath, or any new concerns.  GI evaluation being undertaken for elevated LFT's.  RUQ abdominal ultrasound and MRCP yesterday normal.   CURRENT MEDICATIONS: . apixaban  5 mg Oral BID  . carvedilol  3.125 mg Oral BID WC  . hydrALAZINE  10 mg Intravenous Once  . losartan  50 mg Oral Daily  . sodium chloride  3 mL Intravenous Q12H      OBJECTIVE: Physical Exam: Filed Vitals:   06/14/14 1247 06/14/14 1510 06/14/14 2048 06/15/14 0553  BP: 162/92 153/100 134/81 127/86  Pulse: 66 103 63 63  Temp: 98 F (36.7 C) 97.7 F (36.5 C) 97.8 F (36.6 C) 98.3 F (36.8 C)  TempSrc: Oral Oral Oral Oral  Resp: 18 15 15 19   Height: 5\' 7"  (1.702 m)     Weight: 135 lb 9.6 oz (61.508 kg)   133 lb 14.4 oz (60.737 kg)  SpO2: 99% 99% 98% 97%    Intake/Output Summary (Last 24 hours) at 06/15/14 [id number] Last data filed at 06/14/14 2145  Gross per 24 hour  Intake    240 ml  Output      0 ml  Net    240 ml    Telemetry reveals sinus rhythm with intermittent atrial fibrillation.   GEN- The patient is well appearing, alert and oriented x 3 today.   Head- normocephalic, atraumatic Eyes-  Sclera clear, conjunctiva pink Ears- hearing intact Oropharynx- clear Neck- supple, no JVP Lungs- Clear to ausculation bilaterally, normal work of breathing Heart- Regular rate and rhythm, no murmurs, rubs or gallops, PMI not laterally displaced GI- soft, NT, ND, + BS Extremities- no clubbing, cyanosis, or edema, no hematoma/ bruit Skin- + jaundice Psych- euthymic mood, full affect Neuro- strength and sensation are intact  LABS: Basic Metabolic Panel:  Recent Labs  96/12/5407/30/15 0333  NA 143  K 3.8  CL 104  CO2 29  GLUCOSE 109*  BUN 15  CREATININE 1.14  CALCIUM 8.6   Liver Function Tests:  Recent Labs  06/14/14 0333  AST 96*  ALT 202*  ALKPHOS 348*  BILITOT 8.1*  PROT 6.0  ALBUMIN 2.9*    RADIOLOGY: No results found.  ASSESSMENT AND PLAN:  Active Problems:   Paroxysmal atrial flutter   Persistent atrial fibrillation   Atrial fibrillation  1. Afib Doing well s/p ablation Maintaining sinus rhythm off of amiodarone.  Given elevated LFTs (see below) will stop amiodarone at this time. He cannot miss eliquis.  2. Jaundice, elevated bilirubin, liver failure Given prior itching, bruising, and jaundice, LFTs were ordered which reveal liver failure.  This is a new diagnosis.  RUQ in 2012 is reviewed and did not suggest cirrhosis.  I have spoken with PCP Benedetto Goad(Fred Prestigiacomo) this am who says that patient was given decadron IM for allergies last week.  This seems unlikely to be the cause for his liver failure. Amiodarone has been discontinued.  RUQ US is ordered. I have spoken with Dr Elnoria HowardHung who will see the patient today.  He is initially suspicious for a retained stone. Unfortunately given ablation performed, he cannot hold anticoagulation for at least 8 weeks post ablation.  Ideally, should remain on eliquis for at least 3 months.  3. Chronic systolic dysfunction Stable No change required today  4. Hypertensive cardiovascular disease Elevated BP Will restart home medicine  Transfer to telemetry OK to discharge from a CV standpoint once  GI workup is complete.   Leonia Reeves.D.

## 2014-06-15 NOTE — Discharge Instructions (Signed)
PLEASE REMEMBER TO BRING ALL OF YOUR MEDICATIONS TO EACH OF YOUR FOLLOW-UP OFFICE VISITS.  PLEASE ATTEND ALL SCHEDULED FOLLOW-UP APPOINTMENTS.   Activity: Increase activity slowly as tolerated. You may shower, but no soaking baths (or swimming) for 1 week. No driving for 2 days. No lifting over 5 lbs for 1 week. No sexual activity for 1 week.   You May Return to Work: in 1 week (if applicable)  Wound Care: You may wash cath site gently with soap and water. Keep cath site clean and dry. If you notice pain, swelling, bleeding or pus at your cath site, please call [phone].    Cardiac Site Care Refer to this sheet in the next few weeks. These instructions provide you with information on caring for yourself after your procedure. Your caregiver may also give you more specific instructions. Your treatment has been planned according to current medical practices, but problems sometimes occur. Call your caregiver if you have any problems or questions after your procedure. HOME CARE INSTRUCTIONS  You may shower 24 hours after the procedure. Remove the bandage (dressing) and gently wash the site with plain soap and water. Gently pat the site dry.   Do not apply powder or lotion to the site.   Do not sit in a bathtub, swimming pool, or whirlpool for 5 to 7 days.   No bending, squatting, or lifting anything over 10 pounds (4.5 kg) as directed by your caregiver.   Inspect the site at least twice daily.   Do not drive home if you are discharged the same day of the procedure. Have someone else drive you.   You may drive 24 hours after the procedure unless otherwise instructed by your caregiver.  What to expect:  Any bruising will usually fade within 1 to 2 weeks.   Blood that collects in the tissue (hematoma) may be painful to the touch. It should usually decrease in size and tenderness within 1 to 2 weeks.  SEEK IMMEDIATE MEDICAL CARE IF:  You have unusual pain at the site or down the affected  limb.   You have redness, warmth, swelling, or pain at the site.   You have drainage (other than a small amount of blood on the dressing).   You have chills.   You have a fever or persistent symptoms for more than 72 hours.   You have a fever and your symptoms suddenly get worse.   Your leg becomes pale, cool, tingly, or numb.   You have heavy bleeding from the site. Hold pressure on the site.  Document Released: 10/04/2010 Document Revised: 08/21/2011 Document Reviewed: 10/04/2010 [facility] Patient Information 2012 [place], [place].

## 2014-06-16 LAB — ANA: Anti Nuclear Antibody(ANA): NEGATIVE

## 2014-06-16 MED FILL — Midazolam HCl Inj 2 MG/2ML (Base Equivalent): INTRAMUSCULAR | Qty: 2 | Status: AC

## 2014-06-16 MED FILL — Lidocaine HCl IV Inj 10 MG/ML: INTRAVENOUS | Qty: 5 | Status: AC

## 2014-06-16 MED FILL — Propofol IV Emul 10 MG/ML: INTRAVENOUS | Qty: 20 | Status: AC

## 2014-06-16 MED FILL — Fentanyl Citrate Inj 0.05 MG/ML: INTRAMUSCULAR | Qty: 2 | Status: AC

## 2014-06-17 ENCOUNTER — Encounter (HOSPITAL_COMMUNITY): Payer: Self-pay | Admitting: *Deleted

## 2014-06-19 LAB — ALPHA-1-ANTITRYPSIN: A-1 Antitrypsin, Ser: 157 mg/dL (ref 83–199)

## 2014-06-21 ENCOUNTER — Telehealth (HOSPITAL_COMMUNITY): Payer: Self-pay | Admitting: Vascular Surgery

## 2014-06-21 ENCOUNTER — Telehealth: Payer: Self-pay | Admitting: Internal Medicine

## 2014-06-21 NOTE — Telephone Encounter (Signed)
OPEN IN [facility]

## 2014-06-21 NOTE — Telephone Encounter (Signed)
Patient was to see Dr Peter Mclean after discharge and this was not arranged  I have asked the patient's wife to call Dr Haywood Pao office and I gave her the office number

## 2014-06-21 NOTE — Telephone Encounter (Addendum)
Wife calling re pt just sleeping all day after procedure, was given a steroid shot and had an allergic reaction to it , still itching wife concerned that this is not normal, urine has cleared up it was dark when he came home, heart doing fine, pls advise -wife left over a 3 minute message on my voicemail, not sure why this was sent to me

## 2014-07-07 ENCOUNTER — Ambulatory Visit (HOSPITAL_COMMUNITY)
Admission: RE | Admit: 2014-07-07 | Discharge: 2014-07-07 | Disposition: A | Discharge status: Home / Self Care | Payer: PRIVATE HEALTH INSURANCE | Source: Ambulatory Visit | Attending: Nurse Practitioner | Admitting: Nurse Practitioner

## 2014-07-07 ENCOUNTER — Encounter (HOSPITAL_COMMUNITY): Payer: Self-pay | Admitting: Nurse Practitioner

## 2014-07-07 VITALS — BP 138/84 | HR 67 | Ht 67.0 in | Wt 139.2 lb

## 2014-07-07 DIAGNOSIS — E785 Hyperlipidemia, unspecified: Secondary | ICD-10-CM | POA: Diagnosis not present

## 2014-07-07 DIAGNOSIS — I4892 Unspecified atrial flutter: Secondary | ICD-10-CM | POA: Insufficient documentation

## 2014-07-07 DIAGNOSIS — I5022 Chronic systolic (congestive) heart failure: Secondary | ICD-10-CM | POA: Insufficient documentation

## 2014-07-07 DIAGNOSIS — Z7901 Long term (current) use of anticoagulants: Secondary | ICD-10-CM | POA: Diagnosis not present

## 2014-07-07 DIAGNOSIS — I481 Persistent atrial fibrillation: Secondary | ICD-10-CM | POA: Diagnosis not present

## 2014-07-07 DIAGNOSIS — I429 Cardiomyopathy, unspecified: Secondary | ICD-10-CM | POA: Insufficient documentation

## 2014-07-07 DIAGNOSIS — Z87891 Personal history of nicotine dependence: Secondary | ICD-10-CM | POA: Diagnosis not present

## 2014-07-07 DIAGNOSIS — I480 Paroxysmal atrial fibrillation: Secondary | ICD-10-CM | POA: Diagnosis present

## 2014-07-07 DIAGNOSIS — I10 Essential (primary) hypertension: Secondary | ICD-10-CM | POA: Diagnosis not present

## 2014-07-07 NOTE — Patient Instructions (Signed)
Your physician recommends that you schedule a follow-up appointment in: AS NEEDED  

## 2014-07-07 NOTE — Progress Notes (Signed)
PCP:  Pamelia HoitWILSON,FRED HENRY, MD Amy Clegg. NP, [facility]  The patient presents today for routine electrophysiology follow up s/p ablation of afib 9/29 which was complicated with jaundice and elevated liver enzymes from which ptr is following up with GI, latest appointment this past Wednesday. Note is not in Epic but pt states he is improving and has returned to work full time. From a heart standpoint he is doing great s/p ablation. Has not had andy afib and energy is greatly improved. No post procedure complaints.  Today he denies CP, SOB,  Itching improved. Energy improved. No arrythmia.  Past Medical History  Diagnosis Date  . Hypertension   . Paroxysmal atrial flutter     typical appearing  . Dyslipidemia (high LDL; low HDL)   . Tobacco abuse   . Non-compliance     a. Adm 09/2013 after having stopped his meds.  . Paroxysmal atrial fibrillation   . NICM (nonischemic cardiomyopathy)   . Chronic systolic CHF (congestive heart failure)     a. Dx 09/2013 in setting of AF-RVR; EF 10-15%, possibly tachy-mediated. Cath 09/2013: normal cors. b. TEE: EF 20-25% by echo 10/2013.  Marland Kitchen. Anxiety   . Arthritis   . H/O hematuria   . History of inguinal hernia   . Pseudoaneurysm following procedure     a. RLE pseudoaneurysm after cath, spontaneously decreased in size, did not require compression.  . Sinus bradycardia     a. HR 40's in ER 11/2013 - digoxin stopped, Coreg decreased.  . Elevated liver enzymes    Past Surgical History  Procedure Laterality Date  . Hernia repair  1992 & 2011  . Cholecystectomy    . Tee without cardioversion N/A 10/17/2013    Procedure: TRANSESOPHAGEAL ECHOCARDIOGRAM (TEE);  Surgeon: Thurmon FairMihai Croitoru, MD;  [facility];  Service: Cardiovascular;  Laterality: N/A;  . Cardioversion N/A 10/17/2013    Procedure: CARDIOVERSION;  Surgeon: Thurmon FairMihai Croitoru, MD;  Location: [facility] ENDOSCOPY;  Service: Cardiovascular;  Laterality: N/A;  . Tee without cardioversion  N/A 06/12/2014    Procedure: TRANSESOPHAGEAL ECHOCARDIOGRAM (TEE);  Surgeon: Pricilla RifflePaula Ross V, MD;  [facility];  Service: Cardiovascular;  Laterality: N/A;  . Ablation  06-13-14    PVI and CTI by Dr Johney FrameAllred    Current Outpatient Prescriptions  Medication Sig Dispense Refill  . ALPRAZolam (XANAX) 1 MG tablet Take 1 mg by mouth 2 (two) times daily as needed for anxiety or sleep.       Marland Kitchen. apixaban (ELIQUIS) 5 MG TABS tablet Take 5 mg by mouth 2 (two) times daily.      . carvedilol (COREG) 6.25 MG tablet Take 0.5 tablets (3.125 mg total) by mouth 2 (two) times daily with a meal.  45 tablet  3  . cholestyramine (QUESTRAN) 4 G packet Take 4 g by mouth 2 (two) times daily. CURRENTLY NOT TAKING-LAST DOSE 4 DAYS AGO      . furosemide (LASIX) 40 MG tablet Take 40 mg by mouth daily as needed (for weight gain of >3lbs in one day.).      Marland Kitchen. losartan (COZAAR) 50 MG tablet Take 25-50 mg by mouth 2 (two) times daily. Take 50 mg (1 tablet) in the morning and 25 mg (1/2 tablet) at night.       No current facility-administered medications for this encounter.    No Known Allergies  History   Social History  . Marital Status: Married    Spouse Name: N/A  Number of Children: N/A  . Years of Education: N/A   Occupational History  . [employer] in [employer]  .     Marland Kitchen      Social History Main Topics  . Smoking status: Former Smoker -- 0.50 packs/day for 40 years    Quit date: 06/07/2014  . Smokeless tobacco: Not on file     Comment: Has cut back to 2-3 cigarettes per day.  . Alcohol Use: Yes     Comment: 1-2 A DAY  . Drug Use: Not on file  . Sexual Activity: Not on file   Other Topics Concern  . Not on file   Social History Narrative   Lives in [place] with wife.     Family History  Problem Relation Age of Onset  . Hyperlipidemia Mother   . Hypertension Mother   . Heart attack Father     ROS-  All systems are reviewed and are negative except as  outlined in the HPI above  Physical Exam: Filed Vitals:   07/07/14 1557  BP: 138/84  Pulse: 67  Height: 5\' 7"  (1.702 m)  Weight: 139 lb 3.2 oz (63.141 kg)  SpO2: 99%    GEN- The patient is well appearing, alert and oriented x 3 today.   Head- normocephalic, atraumatic Eyes-  Sclera clear, conjunctiva pink Ears- hearing intact Oropharynx- clear Neck- supple, no JVP Lymph- no cervical lymphadenopathy Lungs- few crackles right lung base Heart- Regular rate and rhythm, no murmurs, rubs or gallops, PMI not laterally displaced GI- soft, NT, ND, + BS Extremities- no clubbing, cyanosis, edema MS- no significant deformity or atrophy Skin- no rash or lesion Psych- euthymic mood, full affect Neuro- strength and sensation are intact  EKG- Normal sinus rhythm, RAD, IRBBB.NSTW abnormality.    Assessment and Plan:  1. Persistent afib/typical appearing flutter Doing well past ablation. Continue Eliquis.  2. Severe biventricular failure/ chronic systolic dysfuction Recent echo is reviewed and has shown improvement in EF  from maintaining sinus rhythm. Today, I explained to the patient the importance of maintaining sinus rhythm long term.   Currently normovolemic.  3. Htn, well managed.  4. F/u Dr. Johney Frame as scheduled 09/25/14.  Marland Kitchen

## 2014-07-14 ENCOUNTER — Encounter (HOSPITAL_COMMUNITY): Payer: PRIVATE HEALTH INSURANCE | Admitting: Nurse Practitioner

## 2014-08-24 ENCOUNTER — Encounter (HOSPITAL_COMMUNITY): Payer: Self-pay | Admitting: Cardiology

## 2014-09-05 ENCOUNTER — Encounter: Payer: Self-pay | Admitting: Internal Medicine

## 2014-09-12 ENCOUNTER — Other Ambulatory Visit (HOSPITAL_COMMUNITY): Payer: Self-pay | Admitting: Internal Medicine

## 2014-09-12 DIAGNOSIS — I5022 Chronic systolic (congestive) heart failure: Secondary | ICD-10-CM

## 2014-09-25 ENCOUNTER — Other Ambulatory Visit (HOSPITAL_COMMUNITY): Payer: Self-pay

## 2014-09-25 ENCOUNTER — Encounter: Payer: PRIVATE HEALTH INSURANCE | Admitting: Internal Medicine

## 2014-09-25 MED ORDER — APIXABAN 5 MG PO TABS: 5.0000 mg | ORAL_TABLET | Freq: Two times a day (BID) | ORAL | Status: DC

## 2014-09-28 ENCOUNTER — Encounter (HOSPITAL_COMMUNITY): Payer: Self-pay | Admitting: Internal Medicine

## 2014-09-28 ENCOUNTER — Encounter: Payer: PRIVATE HEALTH INSURANCE | Admitting: Internal Medicine

## 2014-10-06 ENCOUNTER — Encounter: Payer: PRIVATE HEALTH INSURANCE | Admitting: Internal Medicine

## 2015-01-03 ENCOUNTER — Other Ambulatory Visit: Payer: Self-pay

## 2015-01-03 ENCOUNTER — Ambulatory Visit (INDEPENDENT_AMBULATORY_CARE_PROVIDER_SITE_OTHER): Payer: PRIVATE HEALTH INSURANCE | Admitting: Internal Medicine

## 2015-01-03 ENCOUNTER — Encounter: Payer: Self-pay | Admitting: Internal Medicine

## 2015-01-03 VITALS — BP 152/86 | HR 106 | Ht 67.0 in | Wt 149.8 lb

## 2015-01-03 DIAGNOSIS — I480 Paroxysmal atrial fibrillation: Secondary | ICD-10-CM | POA: Diagnosis not present

## 2015-01-03 DIAGNOSIS — I5022 Chronic systolic (congestive) heart failure: Secondary | ICD-10-CM

## 2015-01-03 DIAGNOSIS — I429 Cardiomyopathy, unspecified: Secondary | ICD-10-CM | POA: Diagnosis not present

## 2015-01-03 DIAGNOSIS — I481 Persistent atrial fibrillation: Secondary | ICD-10-CM | POA: Diagnosis not present

## 2015-01-03 DIAGNOSIS — I428 Other cardiomyopathies: Secondary | ICD-10-CM

## 2015-01-03 DIAGNOSIS — I4819 Other persistent atrial fibrillation: Secondary | ICD-10-CM

## 2015-01-03 MED ORDER — CARVEDILOL 6.25 MG PO TABS: 6.2500 mg | ORAL_TABLET | Freq: Two times a day (BID) | ORAL | Status: DC

## 2015-01-03 NOTE — Patient Instructions (Signed)
Medication Instructions:  Your physician has recommended you make the following change in your medication:  1) Increase Carvedilol to 6.25 mg twice daily   Labwork: Your physician recommends that you return for lab work today BMP/CBC   Testing/Procedure: Your physician has recommended that you have a Cardioversion (DCCV). Electrical Cardioversion uses a jolt of electricity to your heart either through paddles or wired patches attached to your chest. This is a controlled, usually prescheduled, procedure. Defibrillation is done under light anesthesia in the hospital, and you usually go home the day of the procedure. This is done to get your heart back into a normal rhythm. You are not awake for the procedure. Please see the instruction sheet given to you today.---scheduled for Mon 01/08/15 at 11:00  Do not eat or drink after midnight and report to [facility] at 10:00am for cardioversion  Your physician has requested that you have an echocardiogram. Echocardiography is a painless test that uses sound waves to create images of your heart. It provides your doctor with information about the size and shape of your heart and how well your heart's chambers and valves are working. This procedure takes approximately one hour. There are no restrictions for this procedure.---in 2 weeks     Follow-Up: Your physician recommends that you schedule a follow-up appointment in: 4 weeks with Peter Coco, NP and 3 months with Peter Mclean   Any Other Special Instructions Will Be Listed Below (If Applicable).

## 2015-01-03 NOTE — Progress Notes (Signed)
PCP:  Pamelia Hoit, MD Amy Clegg. NP, [facility]  The patient presents today for routine electrophysiology followup. He returns today and is in afib.  He is unaware.  He thinks that this may have begun several weeks ago after URI for which he received a prednisone taper.  He reports compliance with eliquis without interruption over the past month.  Today he denies CP, SOB,  positive for intermittent dizziness, no presyncope or syncope, occasional fatigue.  Past Medical History  Diagnosis Date  . Hypertension   . Paroxysmal atrial flutter     typical appearing  . Dyslipidemia (high LDL; low HDL)   . Tobacco abuse   . Non-compliance     a. Adm 09/2013 after having stopped his meds.  . Paroxysmal atrial fibrillation   . NICM (nonischemic cardiomyopathy)   . Chronic systolic CHF (congestive heart failure)     a. Dx 09/2013 in setting of AF-RVR; EF 10-15%, possibly tachy-mediated. Cath 09/2013: normal cors. b. TEE: EF 20-25% by echo 10/2013.  Marland Kitchen Anxiety   . Arthritis   . H/O hematuria   . History of inguinal hernia   . Pseudoaneurysm following procedure     a. RLE pseudoaneurysm after cath, spontaneously decreased in size, did not require compression.  . Sinus bradycardia     a. HR 40's in ER 11/2013 - digoxin stopped, Coreg decreased.  . Elevated liver enzymes    Past Surgical History  Procedure Laterality Date  . Hernia repair  1992 & 2011  . Cholecystectomy    . Tee without cardioversion N/A 10/17/2013    Procedure: TRANSESOPHAGEAL ECHOCARDIOGRAM (TEE);  Surgeon: Thurmon Fair, MD;  Location: [facility] ENDOSCOPY;  Service: Cardiovascular;  Laterality: N/A;  . Cardioversion N/A 10/17/2013    Procedure: CARDIOVERSION;  Surgeon: Thurmon Fair, MD;  Location: [facility] ENDOSCOPY;  Service: Cardiovascular;  Laterality: N/A;  . Tee without cardioversion N/A 06/12/2014    Procedure: TRANSESOPHAGEAL ECHOCARDIOGRAM (TEE);  Surgeon: Pricilla Riffle, MD;  Location: [facility] ENDOSCOPY;  Service:  Cardiovascular;  Laterality: N/A;  . Ablation  06-13-14    PVI and CTI by Dr Johney Frame  . Left and right heart catheterization with coronary angiogram N/A 10/13/2013    Procedure: LEFT AND RIGHT HEART CATHETERIZATION WITH CORONARY ANGIOGRAM;  Surgeon: Marykay Lex, MD;  Location: [facility] CATH LAB;  Service: Cardiovascular;  Laterality: N/A;  . Loop recorder implant N/A 11/11/2013    Procedure: Psuedo Anuerysm Compression;  Surgeon: Pricilla Riffle, MD;  Location: [facility] CATH LAB;  Service: Cardiovascular;  Laterality: N/A;  . Atrial fibrillation ablation N/A 06/13/2014    Procedure: ATRIAL FIBRILLATION ABLATION;  Surgeon: Gardiner Rhyme, MD;  Location: [facility] CATH LAB;  Service: Cardiovascular;  Laterality: N/A;    Current Outpatient Prescriptions  Medication Sig Dispense Refill  . ALPRAZolam (XANAX) 1 MG tablet Take 1 mg by mouth 2 (two) times daily as needed for anxiety or sleep.     Marland Kitchen apixaban (ELIQUIS) 5 MG TABS tablet Take 1 tablet (5 mg total) by mouth 2 (two) times daily. 60 tablet 3  . carvedilol (COREG) 6.25 MG tablet Take 1 tablet (6.25 mg total) by mouth 2 (two) times daily with a meal. 45 tablet 3  . furosemide (LASIX) 40 MG tablet Take 40 mg by mouth daily as needed (for weight gain of >3lbs in one day.).    Marland Kitchen losartan (COZAAR) 50 MG tablet Take 25-50 mg by mouth 2 (two) times daily. Take 50 mg (1 tablet)  in the morning and 25 mg (1/2 tablet) at night.     No current facility-administered medications for this visit.    No Known Allergies  History   Social History  . Marital Status: Married    Spouse Name: N/A  . Number of Children: N/A  . Years of Education: N/A   Occupational History  . [employer] in [employer]  .     Marland Kitchen      Social History Main Topics  . Smoking status: Former Smoker -- 0.50 packs/day for 40 years    Quit date: 06/07/2014  . Smokeless tobacco: Not on file     Comment: Has cut back to 2-3 cigarettes per day.  . Alcohol Use: Yes      Comment: 1-2 A DAY  . Drug Use: Not on file  . Sexual Activity: Not on file   Other Topics Concern  . Not on file   Social History Narrative   Lives in [place] with wife.     Family History  Problem Relation Age of Onset  . Hyperlipidemia Mother   . Hypertension Mother   . Heart attack Father     ROS-  All systems are reviewed and are negative except as outlined in the HPI above  Physical Exam: Filed Vitals:   01/03/15 1548  BP: 152/86  Pulse: 106  Height:  (1.702 m)  Weight: 149 lb 12.8 oz (67.949 kg)    GEN- The patient is well appearing, alert and oriented x 3 today.   Head- normocephalic, atraumatic Eyes-  Sclera clear, conjunctiva pink Ears- hearing intact Oropharynx- clear Neck- supple,   Lungs- clear Heart- irregular rate and rhythm  GI- soft, NT, ND, + BS Extremities- no clubbing, cyanosis, edema MS- no significant deformity or atrophy Skin- no rash or lesion Psych- euthymic mood, full affect Neuro- strength and sensation are intact  EKG today reveals afib,  V rate 106 bpm, nonspecific ST/ T changes  Echocardiogram 5/15 shows EF improved at 45%. Atrium normal in size  Ecardio monitor reveals sinus rhythm with episodic A. fib and atrial flutter, with v rates controlled.  Assessment and Plan:  1. Persistent afib/typical appearing flutter He has had recurrent afib possibly induced by prednisone He has had tachycardia mediated CM previously and therefore we need to try to keep him in sinus rhythm. Will proceed with cardioversion early next week.  Repeat echo once in sinus. If EF <50%, would offer GENETIC AF study. If he is not a candidate for this study then we may need to reconsider another ablation if he has additional AF. chads2vasc score is at least 2.  Continue long term anticoagultion Follow-up with Lupita Leash in 4 weeks in the AF clinic.  2. Severe biventricular failure/ chronic systolic dysfuction As above Repeat echo after  cardioversion Titrate coreg as above  3. Htn Above goal today Increase coreg to 6.25mg  BID  4. Tobacco He smokes occasionally I have advised that he quit  I worry that with his AF with RVR he is at risk for decompensation/ hospitalization.  Urgent cardioversion is required.  A high level of decision making is required for this patient today.   Hillis Range, MD 01/03/2015 6:11 PM

## 2015-01-04 LAB — CBC WITH DIFFERENTIAL/PLATELET
Basophils Absolute: 0.1 10*3/uL (ref 0.0–0.1)
Basophils Relative: 0.6 % (ref 0.0–3.0)
Eosinophils Absolute: 0.2 10*3/uL (ref 0.0–0.7)
Eosinophils Relative: 1.7 % (ref 0.0–5.0)
HCT: 40.6 % (ref 39.0–52.0)
Hemoglobin: 13.6 g/dL (ref 13.0–17.0)
Lymphocytes Relative: 27.7 % (ref 12.0–46.0)
Lymphs Abs: 2.9 10*3/uL (ref 0.7–4.0)
MCHC: 33.5 g/dL (ref 30.0–36.0)
MCV: 90.3 fl (ref 78.0–100.0)
Monocytes Absolute: 0.8 10*3/uL (ref 0.1–1.0)
Monocytes Relative: 7.7 % (ref 3.0–12.0)
Neutro Abs: 6.5 10*3/uL (ref 1.4–7.7)
Neutrophils Relative %: 62.3 % (ref 43.0–77.0)
Platelets: 203 10*3/uL (ref 150.0–400.0)
RBC: 4.5 Mil/uL (ref 4.22–5.81)
RDW: 14 % (ref 11.5–15.5)
WBC: 10.5 10*3/uL (ref 4.0–10.5)

## 2015-01-04 LAB — BASIC METABOLIC PANEL
BUN: 17 mg/dL (ref 6–23)
CO2: 27 mEq/L (ref 19–32)
Calcium: 9.2 mg/dL (ref 8.4–10.5)
Chloride: 105 mEq/L (ref 96–112)
Creatinine, Ser: 0.96 mg/dL (ref 0.40–1.50)
GFR: 83.87 mL/min (ref >60.00)
Glucose, Bld: 87 mg/dL (ref 70–99)
Potassium: 4.2 mEq/L (ref 3.5–5.1)
Sodium: 139 mEq/L (ref 135–145)

## 2015-01-07 ENCOUNTER — Encounter (HOSPITAL_COMMUNITY): Payer: Self-pay | Admitting: Anesthesiology

## 2015-01-08 ENCOUNTER — Ambulatory Visit (HOSPITAL_COMMUNITY)
Admission: RE | Admit: 2015-01-08 | Discharge: 2015-01-08 | Disposition: A | Discharge status: Home / Self Care | Payer: PRIVATE HEALTH INSURANCE | Source: Ambulatory Visit | Attending: Cardiology | Admitting: Cardiology

## 2015-01-08 ENCOUNTER — Encounter (HOSPITAL_COMMUNITY)
Admission: RE | Disposition: A | Discharge status: Home / Self Care | Payer: Self-pay | Source: Ambulatory Visit | Attending: Cardiology

## 2015-01-08 DIAGNOSIS — Z79899 Other long term (current) drug therapy: Secondary | ICD-10-CM | POA: Diagnosis not present

## 2015-01-08 DIAGNOSIS — I5022 Chronic systolic (congestive) heart failure: Secondary | ICD-10-CM | POA: Insufficient documentation

## 2015-01-08 DIAGNOSIS — I4891 Unspecified atrial fibrillation: Secondary | ICD-10-CM | POA: Diagnosis present

## 2015-01-08 DIAGNOSIS — Z9889 Other specified postprocedural states: Secondary | ICD-10-CM | POA: Diagnosis not present

## 2015-01-08 DIAGNOSIS — Z9114 Patient's other noncompliance with medication regimen: Secondary | ICD-10-CM | POA: Diagnosis not present

## 2015-01-08 DIAGNOSIS — M1990 Unspecified osteoarthritis, unspecified site: Secondary | ICD-10-CM | POA: Diagnosis not present

## 2015-01-08 DIAGNOSIS — F419 Anxiety disorder, unspecified: Secondary | ICD-10-CM | POA: Insufficient documentation

## 2015-01-08 DIAGNOSIS — I481 Persistent atrial fibrillation: Secondary | ICD-10-CM | POA: Diagnosis not present

## 2015-01-08 DIAGNOSIS — F17200 Nicotine dependence, unspecified, uncomplicated: Secondary | ICD-10-CM | POA: Insufficient documentation

## 2015-01-08 DIAGNOSIS — I10 Essential (primary) hypertension: Secondary | ICD-10-CM | POA: Diagnosis not present

## 2015-01-08 DIAGNOSIS — Z538 Procedure and treatment not carried out for other reasons: Secondary | ICD-10-CM | POA: Diagnosis not present

## 2015-01-08 DIAGNOSIS — I483 Typical atrial flutter: Secondary | ICD-10-CM | POA: Insufficient documentation

## 2015-01-08 DIAGNOSIS — Z7901 Long term (current) use of anticoagulants: Secondary | ICD-10-CM | POA: Insufficient documentation

## 2015-01-08 SURGERY — CANCELLED PROCEDURE

## 2015-01-08 MED ORDER — SODIUM CHLORIDE 0.9 % IJ SOLN: 3.0000 mL | Freq: Two times a day (BID) | INTRAMUSCULAR | Status: DC

## 2015-01-08 MED ORDER — SODIUM CHLORIDE 0.9 % IV SOLN: 250.0000 mL | INTRAVENOUS | Status: DC

## 2015-01-08 MED ORDER — SODIUM CHLORIDE 0.9 % IJ SOLN: 3.0000 mL | INTRAMUSCULAR | Status: DC | PRN

## 2015-01-08 NOTE — CV Procedure (Signed)
   Patient noted to be in NSR on an arrival in Endoscopy and DCCV cancelled.  He will followup with Rudi Coco NP on 01/31/2015.     TURNER,TRACI R 01/08/2015, 10:02 AM

## 2015-01-08 NOTE — Progress Notes (Signed)
Late entry: Pt. Came for cardioversion today. On arrival pt appeared to be in NSR, 12-lead EKG confirmed. Dr. Mayford Knife confirmed and pt to follow up with primary cardiologist.

## 2015-01-08 NOTE — H&P (View-Only) (Signed)
  PCP:  Pogorzelski,FRED HENRY, MD Amy Clegg. NP, [facility]  The patient presents today for routine electrophysiology followup. He returns today and is in afib.  He is unaware.  He thinks that this may have begun several weeks ago after URI for which he received a prednisone taper.  He reports compliance with eliquis without interruption over the past month.  Today he denies CP, SOB,  positive for intermittent dizziness, no presyncope or syncope, occasional fatigue.  Past Medical History  Diagnosis Date  . Hypertension   . Paroxysmal atrial flutter     typical appearing  . Dyslipidemia (high LDL; low HDL)   . Tobacco abuse   . Non-compliance     a. Adm 09/2013 after having stopped his meds.  . Paroxysmal atrial fibrillation   . NICM (nonischemic cardiomyopathy)   . Chronic systolic CHF (congestive heart failure)     a. Dx 09/2013 in setting of AF-RVR; EF 10-15%, possibly tachy-mediated. Cath 09/2013: normal cors. b. TEE: EF 20-25% by echo 10/2013.  . Anxiety   . Arthritis   . H/O hematuria   . History of inguinal hernia   . Pseudoaneurysm following procedure     a. RLE pseudoaneurysm after cath, spontaneously decreased in size, did not require compression.  . Sinus bradycardia     a. HR 40's in ER 11/2013 - digoxin stopped, Coreg decreased.  . Elevated liver enzymes    Past Surgical History  Procedure Laterality Date  . Hernia repair  1992 & 2011  . Cholecystectomy    . Tee without cardioversion N/A 10/17/2013    Procedure: TRANSESOPHAGEAL ECHOCARDIOGRAM (TEE);  Surgeon: Mihai Croitoru, MD;  Location: [facility] ENDOSCOPY;  Service: Cardiovascular;  Laterality: N/A;  . Cardioversion N/A 10/17/2013    Procedure: CARDIOVERSION;  Surgeon: Mihai Croitoru, MD;  Location: [facility] ENDOSCOPY;  Service: Cardiovascular;  Laterality: N/A;  . Tee without cardioversion N/A 06/12/2014    Procedure: TRANSESOPHAGEAL ECHOCARDIOGRAM (TEE);  Surgeon: Paula Ross V, MD;  Location: [facility] ENDOSCOPY;  Service:  Cardiovascular;  Laterality: N/A;  . Ablation  06-13-14    PVI and CTI by Dr Tryone Kille  . Left and right heart catheterization with coronary angiogram N/A 10/13/2013    Procedure: LEFT AND RIGHT HEART CATHETERIZATION WITH CORONARY ANGIOGRAM;  Surgeon: Ephriam W Harding, MD;  Location: [facility] CATH LAB;  Service: Cardiovascular;  Laterality: N/A;  . Loop recorder implant N/A 11/11/2013    Procedure: Psuedo Anuerysm Compression;  Surgeon: Paula Ross V, MD;  Location: [facility] CATH LAB;  Service: Cardiovascular;  Laterality: N/A;  . Atrial fibrillation ablation N/A 06/13/2014    Procedure: ATRIAL FIBRILLATION ABLATION;  Surgeon: Takelia Urieta D Iridian Reader, MD;  Location: [facility] CATH LAB;  Service: Cardiovascular;  Laterality: N/A;    Current Outpatient Prescriptions  Medication Sig Dispense Refill  . ALPRAZolam (XANAX) 1 MG tablet Take 1 mg by mouth 2 (two) times daily as needed for anxiety or sleep.     . apixaban (ELIQUIS) 5 MG TABS tablet Take 1 tablet (5 mg total) by mouth 2 (two) times daily. 60 tablet 3  . carvedilol (COREG) 6.25 MG tablet Take 1 tablet (6.25 mg total) by mouth 2 (two) times daily with a meal. 45 tablet 3  . furosemide (LASIX) 40 MG tablet Take 40 mg by mouth daily as needed (for weight gain of >3lbs in one day.).    . losartan (COZAAR) 50 MG tablet Take 25-50 mg by mouth 2 (two) times daily. Take 50 mg (1 tablet)   in the morning and 25 mg (1/2 tablet) at night.     No current facility-administered medications for this visit.    No Known Allergies  History   Social History  . Marital Status: Married    Spouse Name: N/A  . Number of Children: N/A  . Years of Education: N/A   Occupational History  . 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     .      Social History Main Topics  . Smoking status: Former Smoker -- 0.50 packs/day for 40 years    Quit date: 06/07/2014  . Smokeless tobacco: Not on file     Comment: Has cut back to 2-3 cigarettes per day.  . Alcohol Use: Yes      Comment: 1-2 A DAY  . Drug Use: Not on file  . Sexual Activity: Not on file   Other Topics Concern  . Not on file   Social History Narrative   Lives in [place] with wife.     Family History  Problem Relation Age of Onset  . Hyperlipidemia Mother   . Hypertension Mother   . Heart attack Father     ROS-  All systems are reviewed and are negative except as outlined in the HPI above  Physical Exam: Filed Vitals:   01/03/15 1548  BP: 152/86  Pulse: 106  Height: 5' 7" (1.702 m)  Weight: 149 lb 12.8 oz (67.949 kg)    GEN- The patient is well appearing, alert and oriented x 3 today.   Head- normocephalic, atraumatic Eyes-  Sclera clear, conjunctiva pink Ears- hearing intact Oropharynx- clear Neck- supple,   Lungs- clear Heart- irregular rate and rhythm  GI- soft, NT, ND, + BS Extremities- no clubbing, cyanosis, edema MS- no significant deformity or atrophy Skin- no rash or lesion Psych- euthymic mood, full affect Neuro- strength and sensation are intact  EKG today reveals afib,  V rate 106 bpm, nonspecific ST/ T changes  Echocardiogram 5/15 shows EF improved at 45%. Atrium normal in size  Ecardio monitor reveals sinus rhythm with episodic A. fib and atrial flutter, with v rates controlled.  Assessment and Plan:  1. Persistent afib/typical appearing flutter He has had recurrent afib possibly induced by prednisone He has had tachycardia mediated CM previously and therefore we need to try to keep him in sinus rhythm. Will proceed with cardioversion early next week.  Repeat echo once in sinus. If EF <50%, would offer GENETIC AF study. If he is not a candidate for this study then we may need to reconsider another ablation if he has additional AF. chads2vasc score is at least 2.  Continue long term anticoagultion Follow-up with Donna in 4 weeks in the AF clinic.  2. Severe biventricular failure/ chronic systolic dysfuction As above Repeat echo after  cardioversion Titrate coreg as above  3. Htn Above goal today Increase coreg to 6.25mg BID  4. Tobacco He smokes occasionally I have advised that he quit  I worry that with his AF with RVR he is at risk for decompensation/ hospitalization.  Urgent cardioversion is required.  A high level of decision making is required for this patient today.   Deah Ottaway, MD 01/03/2015 6:11 PM    

## 2015-01-08 NOTE — Interval H&P Note (Signed)
History and Physical Interval Note:  01/08/2015 10:01 AM  Peter Mclean  has presented today for surgery, with the diagnosis of AFIB  The various methods of treatment have been discussed with the patient and family. After consideration of risks, benefits and other options for treatment, the patient has consented to  Procedure(s): CARDIOVERSION (N/A) as a surgical intervention .  The patient's history has been reviewed, patient examined, no change in status, stable for surgery.  I have reviewed the patient's chart and labs.  Questions were answered to the patient's satisfaction.     Peter Mclean R

## 2015-01-13 ENCOUNTER — Other Ambulatory Visit (HOSPITAL_COMMUNITY): Payer: Self-pay | Admitting: Internal Medicine

## 2015-01-17 ENCOUNTER — Ambulatory Visit (HOSPITAL_COMMUNITY): Payer: PRIVATE HEALTH INSURANCE

## 2015-01-26 ENCOUNTER — Other Ambulatory Visit (HOSPITAL_COMMUNITY): Payer: Self-pay | Admitting: Internal Medicine

## 2015-01-31 ENCOUNTER — Ambulatory Visit (HOSPITAL_COMMUNITY): Payer: PRIVATE HEALTH INSURANCE | Admitting: Nurse Practitioner

## 2015-02-01 ENCOUNTER — Telehealth: Payer: Self-pay | Admitting: Internal Medicine

## 2015-02-01 NOTE — Telephone Encounter (Signed)
New Message  Pt called states that he was sent to Afib clinic recently. Completed the appt. But the records are saying that he did not come for the appt. Pt req a call back to discuss this and to determine if 05/23 appt is needed. Please call

## 2015-02-05 ENCOUNTER — Inpatient Hospital Stay (HOSPITAL_COMMUNITY)
Admission: RE | Admit: 2015-02-05 | Payer: PRIVATE HEALTH INSURANCE | Source: Ambulatory Visit | Admitting: Nurse Practitioner

## 2015-02-16 ENCOUNTER — Encounter: Payer: Self-pay | Admitting: Internal Medicine

## 2015-02-28 ENCOUNTER — Other Ambulatory Visit (HOSPITAL_COMMUNITY): Payer: Self-pay | Admitting: Internal Medicine

## 2015-04-09 ENCOUNTER — Encounter: Payer: Self-pay | Admitting: Internal Medicine

## 2015-04-09 ENCOUNTER — Ambulatory Visit (INDEPENDENT_AMBULATORY_CARE_PROVIDER_SITE_OTHER): Payer: PRIVATE HEALTH INSURANCE | Admitting: Internal Medicine

## 2015-04-09 VITALS — BP 150/96 | HR 72 | Ht 67.0 in | Wt 147.0 lb

## 2015-04-09 DIAGNOSIS — I10 Essential (primary) hypertension: Secondary | ICD-10-CM

## 2015-04-09 DIAGNOSIS — I481 Persistent atrial fibrillation: Secondary | ICD-10-CM | POA: Diagnosis not present

## 2015-04-09 DIAGNOSIS — I428 Other cardiomyopathies: Secondary | ICD-10-CM

## 2015-04-09 DIAGNOSIS — I429 Cardiomyopathy, unspecified: Secondary | ICD-10-CM | POA: Diagnosis not present

## 2015-04-09 DIAGNOSIS — I4819 Other persistent atrial fibrillation: Secondary | ICD-10-CM

## 2015-04-09 LAB — BASIC METABOLIC PANEL
BUN: 17 mg/dL (ref 6–23)
CO2: 27 mEq/L (ref 19–32)
Calcium: 9.3 mg/dL (ref 8.4–10.5)
Chloride: 105 mEq/L (ref 96–112)
Creatinine, Ser: 1 mg/dL (ref 0.40–1.50)
GFR: 79.94 mL/min (ref >60.00)
Glucose, Bld: 83 mg/dL (ref 70–99)
Potassium: 3.6 mEq/L (ref 3.5–5.1)
Sodium: 139 mEq/L (ref 135–145)

## 2015-04-09 LAB — HEPATIC FUNCTION PANEL
ALT: 17 U/L (ref 0–53)
AST: 17 U/L (ref 0–37)
Albumin: 4 g/dL (ref 3.5–5.2)
Alkaline Phosphatase: 75 U/L (ref 39–117)
Bilirubin, Direct: 0.2 mg/dL (ref 0.0–0.3)
Total Bilirubin: 1 mg/dL (ref 0.2–1.2)
Total Protein: 6.5 g/dL (ref 6.0–8.3)

## 2015-04-09 MED ORDER — LOSARTAN POTASSIUM 100 MG PO TABS: 100.0000 mg | ORAL_TABLET | Freq: Every day | ORAL | Status: DC

## 2015-04-09 NOTE — Progress Notes (Signed)
PCP:  Pamelia Hoit, MD Amy Clegg. NP, [facility]  The patient presents today for routine electrophysiology followup.  He is maintaining sinus rhythm and is unaware of any afib since his last visit.  Today he denies CP, SOB,  positive for intermittent dizziness, no presyncope or syncope, occasional fatigue.  Past Medical History  Diagnosis Date  . Hypertension   . Paroxysmal atrial flutter     typical appearing  . Dyslipidemia (high LDL; low HDL)   . Tobacco abuse   . Non-compliance     a. Adm 09/2013 after having stopped his meds.  . Paroxysmal atrial fibrillation   . NICM (nonischemic cardiomyopathy)   . Chronic systolic CHF (congestive heart failure)     a. Dx 09/2013 in setting of AF-RVR; EF 10-15%, possibly tachy-mediated. Cath 09/2013: normal cors. b. TEE: EF 20-25% by echo 10/2013.  Marland Kitchen Anxiety   . Arthritis   . H/O hematuria   . History of inguinal hernia   . Pseudoaneurysm following procedure     a. RLE pseudoaneurysm after cath, spontaneously decreased in size, did not require compression.  . Sinus bradycardia     a. HR 40's in ER 11/2013 - digoxin stopped, Coreg decreased.  . Elevated liver enzymes    Past Surgical History  Procedure Laterality Date  . Hernia repair  1992 & 2011  . Cholecystectomy    . Tee without cardioversion N/A 10/17/2013    Procedure: TRANSESOPHAGEAL ECHOCARDIOGRAM (TEE);  Surgeon: Thurmon Fair, MD;  Location: [facility] ENDOSCOPY;  Service: Cardiovascular;  Laterality: N/A;  . Cardioversion N/A 10/17/2013    Procedure: CARDIOVERSION;  Surgeon: Thurmon Fair, MD;  Location: [facility] ENDOSCOPY;  Service: Cardiovascular;  Laterality: N/A;  . Tee without cardioversion N/A 06/12/2014    Procedure: TRANSESOPHAGEAL ECHOCARDIOGRAM (TEE);  Surgeon: Pricilla Riffle, MD;  Location: [facility] ENDOSCOPY;  Service: Cardiovascular;  Laterality: N/A;  . Ablation  06-13-14    PVI and CTI by Dr Johney Frame  . Left and right heart catheterization with coronary angiogram N/A  10/13/2013    Procedure: LEFT AND RIGHT HEART CATHETERIZATION WITH CORONARY ANGIOGRAM;  Surgeon: Marykay Lex, MD;  Location: [facility] CATH LAB;  Service: Cardiovascular;  Laterality: N/A;  . Loop recorder implant N/A 11/11/2013    Procedure: Psuedo Anuerysm Compression;  Surgeon: Pricilla Riffle, MD;  Location: [facility] CATH LAB;  Service: Cardiovascular;  Laterality: N/A;  . Atrial fibrillation ablation N/A 06/13/2014    Procedure: ATRIAL FIBRILLATION ABLATION;  Surgeon: Gardiner Rhyme, MD;  Location: [facility] CATH LAB;  Service: Cardiovascular;  Laterality: N/A;    Current Outpatient Prescriptions  Medication Sig Dispense Refill  . ALPRAZolam (XANAX) 1 MG tablet Take 1 mg by mouth 2 (two) times daily as needed for anxiety or sleep.     . carvedilol (COREG) 6.25 MG tablet Take 1 tablet (6.25 mg total) by mouth 2 (two) times daily with a meal. 45 tablet 3  . ELIQUIS 5 MG TABS tablet TAKE 1 TABLET (5 MG TOTAL) BY MOUTH 2 (TWO) TIMES DAILY. 60 tablet 3  . furosemide (LASIX) 40 MG tablet Take 40 mg by mouth daily as needed (for weight gain of >3lbs in one day.).    Marland Kitchen losartan (COZAAR) 50 MG tablet Take 25-50 mg by mouth 2 (two) times daily. Take 50 mg (1 tablet) in the morning and 25 mg (1/2 tablet) at night.    . meloxicam (MOBIC) 15 MG tablet Take 0.5-1 tablets by mouth daily as needed. Knee  pain  2   No current facility-administered medications for this visit.    No Known Allergies  History   Social History  . Marital Status: Married    Spouse Name: N/A  . Number of Children: N/A  . Years of Education: N/A   Occupational History  . [employer] in [employer]  .     Marland Kitchen      Social History Main Topics  . Smoking status: Current Some Day Smoker -- 0.50 packs/day for 40 years    Last Attempt to Quit: 06/07/2014  . Smokeless tobacco: Not on file     Comment: Has cut back to 2-3 cigarettes per day.  . Alcohol Use: 0.0 oz/week    0 Standard drinks or equivalent per week      Comment: 1-2 A DAY  . Drug Use: Not on file  . Sexual Activity: Not on file   Other Topics Concern  . Not on file   Social History Narrative   Lives in [place] with wife.     Family History  Problem Relation Age of Onset  . Hyperlipidemia Mother   . Hypertension Mother   . Heart attack Father     ROS-  All systems are reviewed and are negative except as outlined in the HPI above  Physical Exam: Filed Vitals:   04/09/15 1511  BP: 150/96  Pulse: 72  Height:  (1.702 m)  Weight: 66.679 kg (147 lb)    GEN- The patient is well appearing, alert and oriented x 3 today.   Head- normocephalic, atraumatic Eyes-  Sclera clear, conjunctiva pink Ears- hearing intact Oropharynx- clear Neck- supple,   Lungs- clear Heart- regular rate and rhythm  GI- soft, NT, ND, + BS Extremities- no clubbing, cyanosis, edema MS- no significant deformity or atrophy Skin- no rash or lesion Psych- euthymic mood, full affect Neuro- strength and sensation are intact  EKG today reveals sinus rhythm  Assessment and Plan:  1. Persistent afib  Doing well at this time off of AAD therapy No changes  2. Severe biventricular failure/ chronic systolic dysfuction Repeat echo Needs to maintain sinus long term bmet today  3. Htn Above goal today Increase cozaar to  daily bmet today  4. Tobacco He smokes occasionally I have advised that he quit  5. Prior elevated LFTs Repeat lfts today If normal, no further workup  Return to see Lupita Leash in the AF clinic every 3 months I will see when needed  Hillis Range, MD 04/09/2015 3:30 PM

## 2015-04-09 NOTE — Patient Instructions (Addendum)
Medication Instructions:  Your physician has recommended you make the following change in your medication:  1) Increase to Losartan to 100mg  daily   Labwork: Your physician recommends that you return for lab work today BMP/LIVER   Testing/Procedures: Your physician has requested that you have an echocardiogram. Echocardiography is a painless test that uses sound waves to create images of your heart. It provides your doctor with information about the size and shape of your heart and how well your heart's chambers and valves are working. This procedure takes approximately one hour. There are no restrictions for this procedure.    Follow-Up: Your physician recommends that you schedule a follow-up appointment in: 3 months with Rudi Coco, NP    Any Other Special Instructions Will Be Listed Below (If Applicable).   Low-Sodium Eating Plan Sodium raises blood pressure and causes water to be held in the body. Getting less sodium from food will help lower your blood pressure, reduce any swelling, and protect your heart, liver, and kidneys. We get sodium by adding salt (sodium chloride) to food. Most of our sodium comes from canned, boxed, and frozen foods. Restaurant foods, fast foods, and pizza are also very high in sodium. Even if you take medicine to lower your blood pressure or to reduce fluid in your body, getting less sodium from your food is important. WHAT IS MY PLAN? Most people should limit their sodium intake to 2,300 mg a day. Your health care provider recommends that you limit your sodium intake to 2 grams a day.  WHAT DO I NEED TO KNOW ABOUT THIS EATING PLAN? For the low-sodium eating plan, you will follow these general guidelines:  Choose foods with a % Daily Value for sodium of less than 5% (as listed on the food label).   Use salt-free seasonings or herbs instead of table salt or sea salt.   Check with your health care provider or pharmacist before using salt substitutes.    Eat fresh foods.  Eat more vegetables and fruits.  Limit canned vegetables. If you do use them, rinse them well to decrease the sodium.   Limit cheese to 1 oz (28 g) per day.   Eat lower-sodium products, often labeled as "lower sodium" or "no salt added."  Avoid foods that contain monosodium glutamate (MSG). MSG is sometimes added to [place] food and some canned foods.  Check food labels (Nutrition Facts labels) on foods to learn how much sodium is in one serving.  Eat more home-cooked food and less restaurant, buffet, and fast food.  When eating at a restaurant, ask that your food be prepared with less salt or none, if possible.  HOW DO I READ FOOD LABELS FOR SODIUM INFORMATION? The Nutrition Facts label lists the amount of sodium in one serving of the food. If you eat more than one serving, you must multiply the listed amount of sodium by the number of servings. Food labels may also identify foods as:  Sodium free--Less than 5 mg in a serving.  Very low sodium--35 mg or less in a serving.  Low sodium--140 mg or less in a serving.  Light in sodium--50% less sodium in a serving. For example, if a food that usually has 300 mg of sodium is changed to become light in sodium, it will have 150 mg of sodium.  Reduced sodium--25% less sodium in a serving. For example, if a food that usually has 400 mg of sodium is changed to reduced sodium, it will have 300 mg of  sodium. WHAT FOODS CAN I EAT? Grains Low-sodium cereals, including oats, puffed wheat and rice, and shredded wheat cereals. Low-sodium crackers. Unsalted rice and pasta. Lower-sodium bread.  Vegetables Frozen or fresh vegetables. Low-sodium or reduced-sodium canned vegetables. Low-sodium or reduced-sodium tomato sauce and paste. Low-sodium or reduced-sodium tomato and vegetable juices.  Fruits Fresh, frozen, and canned fruit. Fruit juice.  Meat and Other Protein Products Low-sodium canned tuna and salmon. Fresh  or frozen meat, poultry, seafood, and fish. Lamb. Unsalted nuts. Dried beans, peas, and lentils without added salt. Unsalted canned beans. Homemade soups without salt. Eggs.  Dairy Milk. Soy milk. Ricotta cheese. Low-sodium or reduced-sodium cheeses. Yogurt.  Condiments Fresh and dried herbs and spices. Salt-free seasonings. Onion and garlic powders. Low-sodium varieties of mustard and ketchup. Lemon juice.  Fats and Oils Reduced-sodium salad dressings. Unsalted butter.  Other Unsalted popcorn and pretzels.  The items listed above may not be a complete list of recommended foods or beverages. Contact your dietitian for more options. WHAT FOODS ARE NOT RECOMMENDED? Grains Instant hot cereals. Bread stuffing, pancake, and biscuit mixes. Croutons. Seasoned rice or pasta mixes. Noodle soup cups. Boxed or frozen macaroni and cheese. Self-rising flour. Regular salted crackers. Vegetables Regular canned vegetables. Regular canned tomato sauce and paste. Regular tomato and vegetable juices. Frozen vegetables in sauces. Salted french fries. Olives. Rosita Fire. Relishes. Sauerkraut. Salsa. Meat and Other Protein Products Salted, canned, smoked, spiced, or pickled meats, seafood, or fish. Bacon, ham, sausage, hot dogs, corned beef, chipped beef, and packaged luncheon meats. Salt pork. Jerky. Pickled herring. Anchovies, regular canned tuna, and sardines. Salted nuts. Dairy Processed cheese and cheese spreads. Cheese curds. Blue cheese and cottage cheese. Buttermilk.  Condiments Onion and garlic salt, seasoned salt, table salt, and sea salt. Canned and packaged gravies. Worcestershire sauce. Tartar sauce. Barbecue sauce. Teriyaki sauce. Soy sauce, including reduced sodium. Steak sauce. Fish sauce. Oyster sauce. Cocktail sauce. Horseradish. Regular ketchup and mustard. Meat flavorings and tenderizers. Bouillon cubes. Hot sauce. Tabasco sauce. Marinades. Taco seasonings. Relishes. Fats and  Oils Regular salad dressings. Salted butter. Margarine. Ghee. Bacon fat.  Other Potato and tortilla chips. Corn chips and puffs. Salted popcorn and pretzels. Canned or dried soups. Pizza. Frozen entrees and pot pies.  The items listed above may not be a complete list of foods and beverages to avoid. Contact your dietitian for more information. Document Released: 02/21/2002 Document Revised: 09/06/2013 Document Reviewed: 07/06/2013 [facility] Patient Information 2015 [place], [place]. This information is not intended to replace advice given to you by your health care provider. Make sure you discuss any questions you have with your health care provider.

## 2015-04-17 ENCOUNTER — Ambulatory Visit (HOSPITAL_COMMUNITY): Payer: PRIVATE HEALTH INSURANCE | Attending: Cardiology

## 2015-04-24 ENCOUNTER — Other Ambulatory Visit (HOSPITAL_COMMUNITY): Payer: Self-pay | Admitting: Internal Medicine

## 2015-05-01 ENCOUNTER — Ambulatory Visit (HOSPITAL_COMMUNITY): Payer: PRIVATE HEALTH INSURANCE | Attending: Cardiovascular Disease

## 2015-05-01 DIAGNOSIS — I428 Other cardiomyopathies: Secondary | ICD-10-CM

## 2015-05-01 DIAGNOSIS — I429 Cardiomyopathy, unspecified: Secondary | ICD-10-CM

## 2015-05-01 DIAGNOSIS — F17200 Nicotine dependence, unspecified, uncomplicated: Secondary | ICD-10-CM | POA: Insufficient documentation

## 2015-05-01 DIAGNOSIS — I517 Cardiomegaly: Secondary | ICD-10-CM | POA: Diagnosis not present

## 2015-05-01 DIAGNOSIS — I10 Essential (primary) hypertension: Secondary | ICD-10-CM | POA: Diagnosis not present

## 2015-05-01 DIAGNOSIS — E785 Hyperlipidemia, unspecified: Secondary | ICD-10-CM | POA: Diagnosis not present

## 2015-05-01 DIAGNOSIS — I481 Persistent atrial fibrillation: Secondary | ICD-10-CM

## 2015-05-01 DIAGNOSIS — I071 Rheumatic tricuspid insufficiency: Secondary | ICD-10-CM | POA: Diagnosis not present

## 2015-05-01 DIAGNOSIS — I4819 Other persistent atrial fibrillation: Secondary | ICD-10-CM

## 2015-06-15 ENCOUNTER — Other Ambulatory Visit (HOSPITAL_COMMUNITY): Payer: Self-pay | Admitting: Internal Medicine

## 2015-06-19 ENCOUNTER — Other Ambulatory Visit (HOSPITAL_COMMUNITY): Payer: Self-pay | Admitting: Internal Medicine

## 2015-07-10 ENCOUNTER — Ambulatory Visit (HOSPITAL_COMMUNITY)
Admission: RE | Admit: 2015-07-10 | Discharge: 2015-07-10 | Disposition: A | Discharge status: Home / Self Care | Payer: PRIVATE HEALTH INSURANCE | Source: Ambulatory Visit | Attending: Nurse Practitioner | Admitting: Nurse Practitioner

## 2015-07-10 ENCOUNTER — Encounter (HOSPITAL_COMMUNITY): Payer: Self-pay | Admitting: Nurse Practitioner

## 2015-07-10 VITALS — BP 142/90 | HR 75 | Ht 67.0 in | Wt 145.0 lb

## 2015-07-10 DIAGNOSIS — E785 Hyperlipidemia, unspecified: Secondary | ICD-10-CM | POA: Insufficient documentation

## 2015-07-10 DIAGNOSIS — I4891 Unspecified atrial fibrillation: Secondary | ICD-10-CM | POA: Diagnosis not present

## 2015-07-10 DIAGNOSIS — Z7902 Long term (current) use of antithrombotics/antiplatelets: Secondary | ICD-10-CM | POA: Diagnosis not present

## 2015-07-10 DIAGNOSIS — I5022 Chronic systolic (congestive) heart failure: Secondary | ICD-10-CM | POA: Diagnosis not present

## 2015-07-10 DIAGNOSIS — I110 Hypertensive heart disease with heart failure: Secondary | ICD-10-CM | POA: Insufficient documentation

## 2015-07-10 DIAGNOSIS — I4819 Other persistent atrial fibrillation: Secondary | ICD-10-CM

## 2015-07-10 DIAGNOSIS — I481 Persistent atrial fibrillation: Secondary | ICD-10-CM | POA: Diagnosis not present

## 2015-07-10 DIAGNOSIS — Z79899 Other long term (current) drug therapy: Secondary | ICD-10-CM | POA: Diagnosis not present

## 2015-07-10 DIAGNOSIS — R748 Abnormal levels of other serum enzymes: Secondary | ICD-10-CM | POA: Insufficient documentation

## 2015-07-10 DIAGNOSIS — I428 Other cardiomyopathies: Secondary | ICD-10-CM | POA: Diagnosis not present

## 2015-07-10 DIAGNOSIS — F17210 Nicotine dependence, cigarettes, uncomplicated: Secondary | ICD-10-CM | POA: Insufficient documentation

## 2015-07-10 DIAGNOSIS — F419 Anxiety disorder, unspecified: Secondary | ICD-10-CM | POA: Diagnosis not present

## 2015-07-10 DIAGNOSIS — Z8249 Family history of ischemic heart disease and other diseases of the circulatory system: Secondary | ICD-10-CM | POA: Diagnosis not present

## 2015-07-10 NOTE — Progress Notes (Signed)
Patient ID: Peter Mclean, male   DOB: Sep 07, 1951, 64 y.o.   MRN: [id number]     Primary Care Physician: Pamelia Hoit, MD Referring Physician: Dr. [name] is a 64 y.o. male with a h/o afib, TMC, s/p ablation 9/15. He reports today that he he has not been aware of any further afib since procedure. Repeat echo done in August shows return to NSR. He continue on DOAC without bleeding issues . He did have elevated liver enzymes at time of procedure last fall but liver enzymes have normalized by recheck in July.  Today, he denies symptoms of palpitations, chest pain, shortness of breath, orthopnea, PND, lower extremity edema, dizziness, presyncope, syncope, or neurologic sequela. The patient is tolerating medications without difficulties and is otherwise without complaint today.   Past Medical History  Diagnosis Date  . Hypertension   . Paroxysmal atrial flutter (HCC)     typical appearing  . Dyslipidemia (high LDL; low HDL)   . Tobacco abuse   . Non-compliance     a. Adm 09/2013 after having stopped his meds.  . Paroxysmal atrial fibrillation (HCC)   . NICM (nonischemic cardiomyopathy) (HCC)   . Chronic systolic CHF (congestive heart failure) (HCC)     a. Dx 09/2013 in setting of AF-RVR; EF 10-15%, possibly tachy-mediated. Cath 09/2013: normal cors. b. TEE: EF 20-25% by echo 10/2013.  Marland Kitchen Anxiety   . Arthritis   . H/O hematuria   . History of inguinal hernia   . Pseudoaneurysm following procedure (HCC)     a. RLE pseudoaneurysm after cath, spontaneously decreased in size, did not require compression.  . Sinus bradycardia     a. HR 40's in ER 11/2013 - digoxin stopped, Coreg decreased.  . Elevated liver enzymes    Past Surgical History  Procedure Laterality Date  . Hernia repair  1992 & 2011  . Cholecystectomy    . Tee without cardioversion N/A 10/17/2013    Procedure: TRANSESOPHAGEAL ECHOCARDIOGRAM (TEE);  Surgeon: Thurmon Fair, MD;  Location: [facility] ENDOSCOPY;  Service:  Cardiovascular;  Laterality: N/A;  . Cardioversion N/A 10/17/2013    Procedure: CARDIOVERSION;  Surgeon: Thurmon Fair, MD;  Location: [facility] ENDOSCOPY;  Service: Cardiovascular;  Laterality: N/A;  . Tee without cardioversion N/A 06/12/2014    Procedure: TRANSESOPHAGEAL ECHOCARDIOGRAM (TEE);  Surgeon: Pricilla Riffle, MD;  Location: [facility] ENDOSCOPY;  Service: Cardiovascular;  Laterality: N/A;  . Ablation  06-13-14    PVI and CTI by Dr Johney Frame  . Left and right heart catheterization with coronary angiogram N/A 10/13/2013    Procedure: LEFT AND RIGHT HEART CATHETERIZATION WITH CORONARY ANGIOGRAM;  Surgeon: Marykay Lex, MD;  Location: [facility] CATH LAB;  Service: Cardiovascular;  Laterality: N/A;  . Loop recorder implant N/A 11/11/2013    Procedure: Psuedo Anuerysm Compression;  Surgeon: Pricilla Riffle, MD;  Location: [facility] CATH LAB;  Service: Cardiovascular;  Laterality: N/A;  . Atrial fibrillation ablation N/A 06/13/2014    Procedure: ATRIAL FIBRILLATION ABLATION;  Surgeon: Gardiner Rhyme, MD;  Location: [facility] CATH LAB;  Service: Cardiovascular;  Laterality: N/A;    Current Outpatient Prescriptions  Medication Sig Dispense Refill  . ALPRAZolam (XANAX) 1 MG tablet Take 1 mg by mouth 2 (two) times daily as needed for anxiety or sleep.     . carvedilol (COREG) 6.25 MG tablet Take 1 tablet (6.25 mg total) by mouth 2 (two) times daily with a meal. 45 tablet 3  . ELIQUIS 5 MG TABS tablet TAKE  1 TABLET (5 MG TOTAL) BY MOUTH 2 (TWO) TIMES DAILY. 60 tablet 3  . losartan (COZAAR) 100 MG tablet Take 1 tablet (100 mg total) by mouth daily. 90 tablet 3  . furosemide (LASIX) 40 MG tablet Take 40 mg by mouth daily as needed (for weight gain of >3lbs in one day.).    Marland Kitchen meloxicam (MOBIC) 15 MG tablet Take 0.5-1 tablets by mouth daily as needed. Knee pain  2   No current facility-administered medications for this encounter.    No Known Allergies  Social History   Social History  . Marital Status: Married    Spouse Name: N/A  .  Number of Children: N/A  . Years of Education: N/A   Occupational History  . [employer] in [employer]  .     Marland Kitchen      Social History Main Topics  . Smoking status: Current Some Day Smoker -- 0.50 packs/day for 40 years    Last Attempt to Quit: 06/07/2014  . Smokeless tobacco: Not on file     Comment: Has cut back to 2-3 cigarettes per day.  . Alcohol Use: 0.0 oz/week    0 Standard drinks or equivalent per week     Comment: 1-2 A DAY  . Drug Use: Not on file  . Sexual Activity: Not on file   Other Topics Concern  . Not on file   Social History Narrative   Lives in [place] with wife.     Family History  Problem Relation Age of Onset  . Hyperlipidemia Mother   . Hypertension Mother   . Heart attack Father     ROS- All systems are reviewed and negative except as per the HPI above  Physical Exam: Filed Vitals:   07/10/15 1534  BP: 142/90  Pulse: 75  Height: 5\' 7"  (1.702 m)  Weight: 145 lb (65.772 kg)    GEN- The patient is well appearing, alert and oriented x 3 today.   Head- normocephalic, atraumatic Eyes-  Sclera clear, conjunctiva pink Ears- hearing intact Oropharynx- clear Neck- supple, no JVP Lymph- no cervical lymphadenopathy Lungs- Clear to ausculation bilaterally, normal work of breathing Heart- Regular rate and rhythm, no murmurs, rubs or gallops, PMI not laterally displaced GI- soft, NT, ND, + BS Extremities- no clubbing, cyanosis, or edema MS- no significant deformity or atrophy Skin- no rash or lesion Psych- euthymic mood, full affect Neuro- strength and sensation are intact  EKG-NSR, IRBBB, LPFB, Pr int 168 ms, QRS int 94 ms, Qtc 426 ms  Echo-Left ventricle: The cavity size was normal. Wall thickness was normal. Systolic function was normal. Wall motion was normal; there were no regional wall motion abnormalities. - Left atrium: The atrium was mildly dilated. - Right ventricle: The cavity size was normal.  Wall thickness was normal. Systolic function was normal. - Tricuspid valve: There was trivial regurgitation. - Pulmonary arteries: PA peak pressure: 21 mm Hg (S).  Assessment and Plan: 1. AFIB Staying in SR Continue carvedilol Continue apixaban  2. LV dysfunction Resolved with SR  3. Abnormal liver enzymes Resolved Continue DOAC    Lupita Leash [facility] [address] [place], [place] [address] [phone]

## 2015-10-16 ENCOUNTER — Inpatient Hospital Stay (HOSPITAL_COMMUNITY)
Admission: RE | Admit: 2015-10-16 | Payer: PRIVATE HEALTH INSURANCE | Source: Ambulatory Visit | Admitting: Nurse Practitioner

## 2015-11-04 ENCOUNTER — Other Ambulatory Visit (HOSPITAL_COMMUNITY): Payer: Self-pay | Admitting: Internal Medicine

## 2015-12-17 ENCOUNTER — Telehealth: Payer: Self-pay | Admitting: Internal Medicine

## 2015-12-17 NOTE — Telephone Encounter (Signed)
New Message:   Pt is on blood pressure medicine,he is on three now.

## 2015-12-17 NOTE — Telephone Encounter (Signed)
New Message:   Pt is on 3 blood pressure medicine. He is scheduled for eye surgery sometimes in June,wants to know if he needs to be seen?

## 2015-12-18 NOTE — Telephone Encounter (Signed)
Spoke with pt -- cannot come for appt til may due to work. appt for may 5th @ 330 arranged.

## 2015-12-18 NOTE — Telephone Encounter (Signed)
Attempted to call pt back - all numbers ring without voicemail option. Will try again later today.

## 2015-12-25 ENCOUNTER — Other Ambulatory Visit: Payer: Self-pay | Admitting: *Deleted

## 2016-01-21 ENCOUNTER — Ambulatory Visit (HOSPITAL_COMMUNITY)
Admission: RE | Admit: 2016-01-21 | Discharge: 2016-01-21 | Disposition: A | Discharge status: Home / Self Care | Payer: PRIVATE HEALTH INSURANCE | Source: Ambulatory Visit | Attending: Nurse Practitioner | Admitting: Nurse Practitioner

## 2016-01-21 ENCOUNTER — Encounter (HOSPITAL_COMMUNITY): Payer: Self-pay | Admitting: Nurse Practitioner

## 2016-01-21 VITALS — BP 160/98 | HR 73 | Ht 67.0 in | Wt 142.0 lb

## 2016-01-21 DIAGNOSIS — I429 Cardiomyopathy, unspecified: Secondary | ICD-10-CM | POA: Diagnosis not present

## 2016-01-21 DIAGNOSIS — F419 Anxiety disorder, unspecified: Secondary | ICD-10-CM | POA: Insufficient documentation

## 2016-01-21 DIAGNOSIS — Z7901 Long term (current) use of anticoagulants: Secondary | ICD-10-CM | POA: Insufficient documentation

## 2016-01-21 DIAGNOSIS — Z0189 Encounter for other specified special examinations: Secondary | ICD-10-CM | POA: Insufficient documentation

## 2016-01-21 DIAGNOSIS — R945 Abnormal results of liver function studies: Secondary | ICD-10-CM | POA: Insufficient documentation

## 2016-01-21 DIAGNOSIS — I5022 Chronic systolic (congestive) heart failure: Secondary | ICD-10-CM | POA: Diagnosis not present

## 2016-01-21 DIAGNOSIS — I110 Hypertensive heart disease with heart failure: Secondary | ICD-10-CM | POA: Diagnosis not present

## 2016-01-21 DIAGNOSIS — I5181 Takotsubo syndrome: Secondary | ICD-10-CM | POA: Diagnosis not present

## 2016-01-21 DIAGNOSIS — R001 Bradycardia, unspecified: Secondary | ICD-10-CM | POA: Insufficient documentation

## 2016-01-21 DIAGNOSIS — F17210 Nicotine dependence, cigarettes, uncomplicated: Secondary | ICD-10-CM | POA: Diagnosis not present

## 2016-01-21 DIAGNOSIS — Z9889 Other specified postprocedural states: Secondary | ICD-10-CM | POA: Insufficient documentation

## 2016-01-21 DIAGNOSIS — I481 Persistent atrial fibrillation: Secondary | ICD-10-CM

## 2016-01-21 DIAGNOSIS — I4891 Unspecified atrial fibrillation: Secondary | ICD-10-CM | POA: Insufficient documentation

## 2016-01-21 DIAGNOSIS — I4892 Unspecified atrial flutter: Secondary | ICD-10-CM | POA: Diagnosis not present

## 2016-01-21 DIAGNOSIS — I4819 Other persistent atrial fibrillation: Secondary | ICD-10-CM

## 2016-01-21 DIAGNOSIS — M1990 Unspecified osteoarthritis, unspecified site: Secondary | ICD-10-CM | POA: Insufficient documentation

## 2016-01-21 DIAGNOSIS — E785 Hyperlipidemia, unspecified: Secondary | ICD-10-CM | POA: Insufficient documentation

## 2016-01-21 LAB — BASIC METABOLIC PANEL
Anion gap: 9 (ref 5–15)
BUN: 14 mg/dL (ref 6–20)
CO2: 31 mmol/L (ref 22–32)
Calcium: 9.5 mg/dL (ref 8.9–10.3)
Chloride: 105 mmol/L (ref 101–111)
Creatinine, Ser: 1.14 mg/dL (ref 0.61–1.24)
GFR calc Af Amer: >60 mL/min (ref >60)
GFR calc non Af Amer: >60 mL/min (ref >60)
Glucose, Bld: 92 mg/dL (ref 65–99)
Potassium: 3.7 mmol/L (ref 3.5–5.1)
Sodium: 145 mmol/L (ref 135–145)

## 2016-01-21 MED ORDER — AMLODIPINE BESYLATE 5 MG PO TABS: 2.5000 mg | ORAL_TABLET | Freq: Every day | ORAL | Status: DC

## 2016-01-21 NOTE — Patient Instructions (Signed)
Your physician has recommended you make the following change in your medication:  1)Amlodipine -- take 1/2 tablet once a day   Call with report of blood pressure reading next week

## 2016-01-22 ENCOUNTER — Encounter (HOSPITAL_COMMUNITY): Payer: Self-pay | Admitting: Nurse Practitioner

## 2016-01-22 NOTE — Progress Notes (Signed)
Patient ID: YONAS BUNDA, male   DOB: 22-Dec-1950, 65 y.o.   MRN: [id number]     Primary Care Physician: Pamelia Hoit, MD Referring Physician: Dr. [name] is a 65 y.o. male with a h/o afib, TMC, s/p ablation 9/15. He reports today that he he has not been aware of any further afib since procedure. Repeat echo done in August shows return to NSR. He continue on DOAC without bleeding issues . He did have elevated liver enzymes at time of procedure last fall but liver enzymes have normalized by recheck in July 2016.  He returns to afib clinic, 5/8 and is doing well from a afib aspect, maintaining SR. His blood pressure has been elevated above 150 mg for the majority of time. He was started on 12.5 mg HCTZ several weeks ago but without any change in BP. He does eat a lot of salt in diet and this was discussed with pt.  Today, he denies symptoms of palpitations, chest pain, shortness of breath, orthopnea, PND, lower extremity edema, dizziness, presyncope, syncope, or neurologic sequela. The patient is tolerating medications without difficulties and is otherwise without complaint today.   Past Medical History  Diagnosis Date  . Hypertension   . Paroxysmal atrial flutter (HCC)     typical appearing  . Dyslipidemia (high LDL; low HDL)   . Tobacco abuse   . Non-compliance     a. Adm 09/2013 after having stopped his meds.  . Paroxysmal atrial fibrillation (HCC)   . NICM (nonischemic cardiomyopathy) (HCC)   . Chronic systolic CHF (congestive heart failure) (HCC)     a. Dx 09/2013 in setting of AF-RVR; EF 10-15%, possibly tachy-mediated. Cath 09/2013: normal cors. b. TEE: EF 20-25% by echo 10/2013.  Marland Kitchen Anxiety   . Arthritis   . H/O hematuria   . History of inguinal hernia   . Pseudoaneurysm following procedure (HCC)     a. RLE pseudoaneurysm after cath, spontaneously decreased in size, did not require compression.  . Sinus bradycardia     a. HR 40's in ER 11/2013 - digoxin stopped,  Coreg decreased.  . Elevated liver enzymes    Past Surgical History  Procedure Laterality Date  . Hernia repair  1992 & 2011  . Cholecystectomy    . Tee without cardioversion N/A 10/17/2013    Procedure: TRANSESOPHAGEAL ECHOCARDIOGRAM (TEE);  Surgeon: Thurmon Fair, MD;  Location: [facility] ENDOSCOPY;  Service: Cardiovascular;  Laterality: N/A;  . Cardioversion N/A 10/17/2013    Procedure: CARDIOVERSION;  Surgeon: Thurmon Fair, MD;  Location: [facility] ENDOSCOPY;  Service: Cardiovascular;  Laterality: N/A;  . Tee without cardioversion N/A 06/12/2014    Procedure: TRANSESOPHAGEAL ECHOCARDIOGRAM (TEE);  Surgeon: Pricilla Riffle, MD;  Location: [facility] ENDOSCOPY;  Service: Cardiovascular;  Laterality: N/A;  . Ablation  06-13-14    PVI and CTI by Dr Johney Frame  . Left and right heart catheterization with coronary angiogram N/A 10/13/2013    Procedure: LEFT AND RIGHT HEART CATHETERIZATION WITH CORONARY ANGIOGRAM;  Surgeon: Marykay Lex, MD;  Location: [facility] CATH LAB;  Service: Cardiovascular;  Laterality: N/A;  . Loop recorder implant N/A 11/11/2013    Procedure: Psuedo Anuerysm Compression;  Surgeon: Pricilla Riffle, MD;  Location: [facility] CATH LAB;  Service: Cardiovascular;  Laterality: N/A;  . Atrial fibrillation ablation N/A 06/13/2014    Procedure: ATRIAL FIBRILLATION ABLATION;  Surgeon: Gardiner Rhyme, MD;  Location: [facility] CATH LAB;  Service: Cardiovascular;  Laterality: N/A;    Current Outpatient Prescriptions  Medication Sig Dispense Refill  . ALPRAZolam (XANAX) 1 MG tablet Take 1 mg by mouth 2 (two) times daily as needed for anxiety or sleep.     . carvedilol (COREG) 6.25 MG tablet Take 1 tablet (6.25 mg total) by mouth 2 (two) times daily with a meal. 45 tablet 3  . ELIQUIS 5 MG TABS tablet TAKE 1 TABLET BY MOUTH TWICE A DAY 60 tablet 3  . hydrochlorothiazide (MICROZIDE) 12.5 MG capsule Take 12.5 mg by mouth daily.    Marland Kitchen losartan (COZAAR) 100 MG tablet Take 1 tablet (100 mg total) by mouth daily. 90 tablet 3  . amLODipine  (NORVASC) 5 MG tablet Take 0.5 tablets (2.5 mg total) by mouth daily. 30 tablet 3   No current facility-administered medications for this encounter.    No Known Allergies  Social History   Social History  . Marital Status: Married    Spouse Name: N/A  . Number of Children: N/A  . Years of Education: N/A   Occupational History  . [employer] in [employer]  .     Marland Kitchen      Social History Main Topics  . Smoking status: Current Some Day Smoker -- 0.50 packs/day for 40 years    Last Attempt to Quit: 06/07/2014  . Smokeless tobacco: Not on file     Comment: Has cut back to 2-3 cigarettes per day.  . Alcohol Use: 0.0 oz/week    0 Standard drinks or equivalent per week     Comment: 1-2 A DAY  . Drug Use: Not on file  . Sexual Activity: Not on file   Other Topics Concern  . Not on file   Social History Narrative   Lives in [place] with wife.     Family History  Problem Relation Age of Onset  . Hyperlipidemia Mother   . Hypertension Mother   . Heart attack Father     ROS- All systems are reviewed and negative except as per the HPI above  Physical Exam: Filed Vitals:   01/21/16 1547  BP: 160/98  Pulse: 73  Height: 5\' 7"  (1.702 m)  Weight: 142 lb (64.411 kg)    GEN- The patient is well appearing, alert and oriented x 3 today.   Head- normocephalic, atraumatic Eyes-  Sclera clear, conjunctiva pink Ears- hearing intact Oropharynx- clear Neck- supple, no JVP Lymph- no cervical lymphadenopathy Lungs- Clear to ausculation bilaterally, normal work of breathing Heart- Regular rate and rhythm, no murmurs, rubs or gallops, PMI not laterally displaced GI- soft, NT, ND, + BS Extremities- no clubbing, cyanosis, or edema MS- no significant deformity or atrophy Skin- no rash or lesion Psych- euthymic mood, full affect Neuro- strength and sensation are intact  EKG-NSR, LAFB, pr int 146 ms, QRS int 94 ms, QTc 416 ms Epic records  reviewed  Echo-Left ventricle: The cavity size was normal. Wall thickness was normal. Systolic function was normal. Wall motion was normal; there were no regional wall motion abnormalities. - Left atrium: The atrium was mildly dilated. - Right ventricle: The cavity size was normal. Wall thickness was normal. Systolic function was normal. - Tricuspid valve: There was trivial regurgitation. - Pulmonary arteries: PA peak pressure: 21 mm Hg (S).  Assessment and Plan: 1. AFIB Staying in SR Continue carvedilol Continue apixaban  2. LV dysfunction Resolved with SR Continue losartan/BB  3. Abnormal liver enzymes Resolved  4. HTN Still elevated with repeat BP at 160/100 Continue ACE/BB/ HCTZ and  add amlodipine at 5 mg, to start with 1/2 tab and plant nurse will check BP's over next week and call to office. If not well controlled will increase to 5 mg a day.  F/u in 6 months   Elvina Sidle. Matthew Folks [facility] [address] [place], [place] [address] [phone]

## 2016-03-08 ENCOUNTER — Other Ambulatory Visit (HOSPITAL_COMMUNITY): Payer: Self-pay | Admitting: Internal Medicine

## 2016-05-10 ENCOUNTER — Other Ambulatory Visit: Payer: Self-pay | Admitting: Internal Medicine

## 2016-07-07 ENCOUNTER — Other Ambulatory Visit: Payer: Self-pay | Admitting: Internal Medicine

## 2016-07-07 DIAGNOSIS — R51 Headache: Principal | ICD-10-CM

## 2016-07-07 DIAGNOSIS — G8929 Other chronic pain: Secondary | ICD-10-CM

## 2016-07-08 ENCOUNTER — Telehealth: Payer: Self-pay

## 2016-07-08 ENCOUNTER — Other Ambulatory Visit: Payer: Self-pay | Admitting: Internal Medicine

## 2016-07-08 ENCOUNTER — Emergency Department (HOSPITAL_COMMUNITY)
Admission: EM | Admit: 2016-07-08 | Discharge: 2016-07-09 | Disposition: A | Discharge status: Home / Self Care | Payer: PRIVATE HEALTH INSURANCE | Attending: Emergency Medicine | Admitting: Emergency Medicine

## 2016-07-08 ENCOUNTER — Ambulatory Visit
Admission: RE | Admit: 2016-07-08 | Discharge: 2016-07-08 | Disposition: A | Discharge status: Home / Self Care | Payer: PRIVATE HEALTH INSURANCE | Source: Ambulatory Visit | Attending: Internal Medicine | Admitting: Internal Medicine

## 2016-07-08 ENCOUNTER — Encounter (HOSPITAL_COMMUNITY): Payer: Self-pay | Admitting: *Deleted

## 2016-07-08 DIAGNOSIS — I110 Hypertensive heart disease with heart failure: Secondary | ICD-10-CM | POA: Insufficient documentation

## 2016-07-08 DIAGNOSIS — Z955 Presence of coronary angioplasty implant and graft: Secondary | ICD-10-CM | POA: Diagnosis not present

## 2016-07-08 DIAGNOSIS — R51 Headache: Principal | ICD-10-CM

## 2016-07-08 DIAGNOSIS — I5022 Chronic systolic (congestive) heart failure: Secondary | ICD-10-CM | POA: Diagnosis not present

## 2016-07-08 DIAGNOSIS — R519 Headache, unspecified: Secondary | ICD-10-CM

## 2016-07-08 DIAGNOSIS — F17200 Nicotine dependence, unspecified, uncomplicated: Secondary | ICD-10-CM | POA: Diagnosis not present

## 2016-07-08 DIAGNOSIS — Z7901 Long term (current) use of anticoagulants: Secondary | ICD-10-CM | POA: Insufficient documentation

## 2016-07-08 DIAGNOSIS — G8929 Other chronic pain: Secondary | ICD-10-CM

## 2016-07-08 LAB — COMPREHENSIVE METABOLIC PANEL
ALT: 46 U/L (ref 17–63)
AST: 43 U/L — ABNORMAL HIGH (ref 15–41)
Albumin: 3.8 g/dL (ref 3.5–5.0)
Alkaline Phosphatase: 108 U/L (ref 38–126)
Anion gap: 10 (ref 5–15)
BUN: 19 mg/dL (ref 6–20)
CO2: 28 mmol/L (ref 22–32)
Calcium: 9.7 mg/dL (ref 8.9–10.3)
Chloride: 99 mmol/L — ABNORMAL LOW (ref 101–111)
Creatinine, Ser: 1.21 mg/dL (ref 0.61–1.24)
GFR calc Af Amer: >60 mL/min (ref >60)
GFR calc non Af Amer: >60 mL/min (ref >60)
Glucose, Bld: 101 mg/dL — ABNORMAL HIGH (ref 65–99)
Potassium: 3.6 mmol/L (ref 3.5–5.1)
Sodium: 137 mmol/L (ref 135–145)
Total Bilirubin: 1.6 mg/dL — ABNORMAL HIGH (ref 0.3–1.2)
Total Protein: 7.3 g/dL (ref 6.5–8.1)

## 2016-07-08 LAB — DIFFERENTIAL
Basophils Absolute: 0 10*3/uL (ref 0.0–0.1)
Basophils Relative: 0 %
Eosinophils Absolute: 0.2 10*3/uL (ref 0.0–0.7)
Eosinophils Relative: 1 %
Lymphocytes Relative: 22 %
Lymphs Abs: 2.4 10*3/uL (ref 0.7–4.0)
Monocytes Absolute: 1.3 10*3/uL — ABNORMAL HIGH (ref 0.1–1.0)
Monocytes Relative: 12 %
Neutro Abs: 7 10*3/uL (ref 1.7–7.7)
Neutrophils Relative %: 65 %

## 2016-07-08 LAB — I-STAT CHEM 8, ED
BUN: 24 mg/dL — ABNORMAL HIGH (ref 6–20)
Calcium, Ion: 1.08 mmol/L — ABNORMAL LOW (ref 1.15–1.40)
Chloride: 99 mmol/L — ABNORMAL LOW (ref 101–111)
Creatinine, Ser: 1.1 mg/dL (ref 0.61–1.24)
Glucose, Bld: 96 mg/dL (ref 65–99)
HCT: 50 % (ref 39.0–52.0)
Hemoglobin: 17 g/dL (ref 13.0–17.0)
Potassium: 3.6 mmol/L (ref 3.5–5.1)
Sodium: 138 mmol/L (ref 135–145)
TCO2: 28 mmol/L (ref 0–100)

## 2016-07-08 LAB — CBC
HCT: 47.8 % (ref 39.0–52.0)
Hemoglobin: 16.2 g/dL (ref 13.0–17.0)
MCH: 30.6 pg (ref 26.0–34.0)
MCHC: 33.9 g/dL (ref 30.0–36.0)
MCV: 90.4 fL (ref 78.0–100.0)
Platelets: 235 10*3/uL (ref 150–400)
RBC: 5.29 MIL/uL (ref 4.22–5.81)
RDW: 12.7 % (ref 11.5–15.5)
WBC: 10.9 10*3/uL — ABNORMAL HIGH (ref 4.0–10.5)

## 2016-07-08 LAB — I-STAT TROPONIN, ED: Troponin i, poc: 0.01 ng/mL (ref 0.00–0.08)

## 2016-07-08 LAB — APTT: aPTT: 39 seconds — ABNORMAL HIGH (ref 24–36)

## 2016-07-08 LAB — PROTIME-INR
INR: 1.11
Prothrombin Time: 14.3 seconds (ref 11.4–15.2)

## 2016-07-08 MED ORDER — VALPROATE SODIUM 500 MG/5ML IV SOLN
500.0000 mg | Freq: Once | INTRAVENOUS | Status: AC
  Administered 2016-07-08: 500 mg via INTRAVENOUS
  Filled 2016-07-08 (×2): qty 5

## 2016-07-08 MED ORDER — SODIUM CHLORIDE 0.9 % IV BOLUS (SEPSIS)
1000.0000 mL | Freq: Once | INTRAVENOUS | Status: AC
  Administered 2016-07-08: 1000 mL via INTRAVENOUS

## 2016-07-08 MED ORDER — PROCHLORPERAZINE EDISYLATE 5 MG/ML IJ SOLN
10.0000 mg | Freq: Once | INTRAMUSCULAR | Status: AC
  Administered 2016-07-08: 10 mg via INTRAVENOUS
  Filled 2016-07-08: qty 2

## 2016-07-08 MED ORDER — MAGNESIUM SULFATE 2 GM/50ML IV SOLN
2.0000 g | Freq: Once | INTRAVENOUS | Status: AC
  Administered 2016-07-08: 2 g via INTRAVENOUS
  Filled 2016-07-08: qty 50

## 2016-07-08 MED ORDER — KETOROLAC TROMETHAMINE 30 MG/ML IJ SOLN
30.0000 mg | Freq: Once | INTRAMUSCULAR | Status: AC
  Administered 2016-07-08: 30 mg via INTRAVENOUS
  Filled 2016-07-08: qty 1

## 2016-07-08 MED ORDER — DIAZEPAM 5 MG/ML IJ SOLN
2.5000 mg | Freq: Once | INTRAMUSCULAR | Status: AC
  Administered 2016-07-08: 2.5 mg via INTRAVENOUS
  Filled 2016-07-08: qty 2

## 2016-07-08 MED ORDER — DIPHENHYDRAMINE HCL 50 MG/ML IJ SOLN
25.0000 mg | Freq: Once | INTRAMUSCULAR | Status: AC
Start: 2016-07-08 — End: 2016-07-08
  Administered 2016-07-08: 25 mg via INTRAVENOUS
  Filled 2016-07-08: qty 1

## 2016-07-08 NOTE — Telephone Encounter (Signed)
Received a call from Dr. Richrd Sox at [facility] regarding this pt. Per Dr. Richrd Sox, this pt presented to their clinic with headaches. Pt has a history of afib. 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 Pt is currently on his way to have an MRI bran and MRA head. Dr. Richrd Sox wants pt to be seen by a physician here at [facility] tomorrow.  I spoke to Dr. Vickey Huger, (work in doctor for this afternoon), and she advised me that I should tell this practice that is calling that the pt needs to proceed to the ER immediately for possible TPA or IV heparin. I called Dr. Riki Rusk office back and spoke to his nurse, Marchelle Folks, and advised her of this information. She informs me that Dr. Richrd Sox has asked the pt to go to the ER for the past 2 days but he refuses. Pt is adamant that he will not go to the ER. Marchelle Folks says that she is not sure of the reason. At this point in the conversation, Dr. Vickey Huger comes out of a patient room, and takes over the call. She speaks with Dr. Richrd Sox and tells him that pt needs to proceed to the ER but we cannot have the pt come to the clinic to be seen tomorrow if he is indeed experiencing a basilar artery thrombosis. Dr. Marylou Flesher encouraged Dr. Richrd Sox to document pt's refusal to go the ED but she will not see the pt tomorrow here in the clinic.

## 2016-07-08 NOTE — ED Triage Notes (Signed)
Pt coming from [facility] for neuro workup. Pt had CT and MRI done. Results concerning for possible stroke. Pt has no neuro deficits, pt only complaint is headache for 3 days. Pt reports nuchal rigidity. Pt denies fever at home. Pt denies any recent MVC, fall, or injury to head/neck.

## 2016-07-08 NOTE — ED Notes (Signed)
Brought pt back to room, pt begins to immediately yell at this nurse in frustration that he is only here to speak to the EDP and hear what his MRI results have said. Pt threw BP cuff across bed, refuses to sit on bed, pt very agitated.

## 2016-07-09 NOTE — Telephone Encounter (Signed)
I spoke to Dr. Darlyne Russian and explained that it would not be in the patient's best interest to be seen outpatient. Given the suspected diagnosis by CT and his symptoms I strongly urged him to send the patient to the local ER, which the patient apparently refused to do. CD

## 2016-07-09 NOTE — ED Provider Notes (Signed)
[facility]-EMERGENCY DEPT Provider Note   CSN: [id number] Arrival date & time: 07/08/16  1712     History   Chief Complaint Chief Complaint  Patient presents with  . Headache    HPI Peter Mclean is a 65 y.o. male.  65 yo M with a cc of a headache.  Going on for past three days, slow in onset and now 10/10.  Has hx of headaches but not have been this severe  Worse with movement of his neck.  Denies fevers, chills.  Denies syncope.  Went to urgent care had a CT that was concerning for possible stroke sent for outpatient MRI and checked into the ED while waiting his results.  Denies head injury.  Denies specific pain in his neck, denies neck injury.    The history is provided by the patient and the spouse.  Headache   This is a new problem. The current episode started more than 2 days ago. The problem occurs constantly. The problem has not changed since onset.The headache is associated with bright light (turning of his head). The quality of the pain is described as dull and throbbing. The pain is at a severity of 10/10. The pain is severe. The pain does not radiate. Pertinent negatives include no fever, no palpitations, no shortness of breath and no vomiting. He has tried nothing for the symptoms. The treatment provided no relief.    Past Medical History:  Diagnosis Date  . Anxiety   . Arthritis   . Chronic systolic CHF (congestive heart failure) (HCC)    a. Dx 09/2013 in setting of AF-RVR; EF 10-15%, possibly tachy-mediated. Cath 09/2013: normal cors. b. TEE: EF 20-25% by echo 10/2013.  Marland Kitchen Dyslipidemia (high LDL; low HDL)   . Elevated liver enzymes   . H/O hematuria   . History of inguinal hernia   . Hypertension   . NICM (nonischemic cardiomyopathy) (HCC)   . Non-compliance    a. Adm 09/2013 after having stopped his meds.  . Paroxysmal atrial fibrillation (HCC)   . Paroxysmal atrial flutter (HCC)    typical appearing  . Pseudoaneurysm following procedure (HCC)    a. RLE  pseudoaneurysm after cath, spontaneously decreased in size, did not require compression.  . Sinus bradycardia    a. HR 40's in ER 11/2013 - digoxin stopped, Coreg decreased.  . Tobacco abuse     Patient Active Problem List   Diagnosis Date Noted  . Atrial fibrillation (HCC) 06/13/2014  . Chronic systolic CHF (congestive heart failure) (HCC) 12/13/2013  . Sinus bradycardia 12/13/2013  . Postoperative groin pseudoaneurysm (HCC) 11/17/2013  . Tobacco abuse   . NICM (nonischemic cardiomyopathy) (HCC) 10/14/2013  . Non compliance with medical treatment- stopped meds prior to adm 10/14/2013  . Dyslipidemia (high LDL; low HDL)   . Persistent atrial fibrillation (HCC) 10/12/2013  . HTN (hypertension) 10/12/2013  . Paroxysmal atrial flutter (HCC) 05/24/2009  . ANXIETY 05/23/2009  . ARTHRITIS 05/23/2009  . HEMATURIA, HX OF 05/23/2009  . INGUINAL HERNIA, HX OF 05/23/2009    Past Surgical History:  Procedure Laterality Date  . ABLATION  06-13-14   PVI and CTI by Dr Johney Frame  . ATRIAL FIBRILLATION ABLATION N/A 06/13/2014   Procedure: ATRIAL FIBRILLATION ABLATION;  Surgeon: Gardiner Rhyme, MD;  Location: [facility] CATH LAB;  Service: Cardiovascular;  Laterality: N/A;  . CARDIOVERSION N/A 10/17/2013   Procedure: CARDIOVERSION;  Surgeon: Thurmon Fair, MD;  Location: [facility] ENDOSCOPY;  Service: Cardiovascular;  Laterality: N/A;  . CHOLECYSTECTOMY    .  HERNIA REPAIR  1992 & 2011  . LEFT AND RIGHT HEART CATHETERIZATION WITH CORONARY ANGIOGRAM N/A 10/13/2013   Procedure: LEFT AND RIGHT HEART CATHETERIZATION WITH CORONARY ANGIOGRAM;  Surgeon: Marykay Lex, MD;  Location: [facility] CATH LAB;  Service: Cardiovascular;  Laterality: N/A;  . LOOP RECORDER IMPLANT N/A 11/11/2013   Procedure: Psuedo Anuerysm Compression;  Surgeon: Pricilla Riffle, MD;  Location: [facility] CATH LAB;  Service: Cardiovascular;  Laterality: N/A;  . TEE WITHOUT CARDIOVERSION N/A 10/17/2013   Procedure: TRANSESOPHAGEAL ECHOCARDIOGRAM (TEE);  Surgeon: Thurmon Fair, MD;  Location: [facility] ENDOSCOPY;  Service: Cardiovascular;  Laterality: N/A;  . TEE WITHOUT CARDIOVERSION N/A 06/12/2014   Procedure: TRANSESOPHAGEAL ECHOCARDIOGRAM (TEE);  Surgeon: Pricilla Riffle, MD;  Location: [facility] ENDOSCOPY;  Service: Cardiovascular;  Laterality: N/A;       Home Medications    Prior to Admission medications   Medication Sig Start Date End Date Taking? Authorizing Provider  ALPRAZolam Prudy Feeler) 1 MG tablet Take 1 mg by mouth 2 (two) times daily as needed for anxiety or sleep.    Yes Historical Provider, MD  amLODipine (NORVASC) 5 MG tablet Take 0.5 tablets (2.5 mg total) by mouth daily. Patient taking differently: Take 5 mg by mouth daily.  01/21/16  Yes Newman Nip, NP  carvedilol (COREG) 6.25 MG tablet Take 1 tablet (6.25 mg total) by mouth 2 (two) times daily with a meal. 01/03/15  Yes Hillis Range, MD  ELIQUIS 5 MG TABS tablet TAKE 1 TABLET BY MOUTH TWICE A DAY 03/12/16  Yes Dolores Patty, MD  hydrochlorothiazide (MICROZIDE) 12.5 MG capsule Take 12.5 mg by mouth daily.   Yes Historical Provider, MD  losartan (COZAAR) 100 MG tablet TAKE 1 TABLET (100 MG TOTAL) BY MOUTH DAILY. 05/12/16  Yes Hillis Range, MD    Family History Family History  Problem Relation Age of Onset  . Hyperlipidemia Mother   . Hypertension Mother   . Heart attack Father     Social History Social History  Substance Use Topics  . Smoking status: Current Some Day Smoker    Packs/day: 0.50    Years: 40.00    Last attempt to quit: 06/07/2014  . Smokeless tobacco: Not on file     Comment: Has cut back to 2-3 cigarettes per day.  . Alcohol use 0.0 oz/week     Comment: 1-2 A DAY     Allergies   Tramadol   Review of Systems Review of Systems  Constitutional: Negative for chills and fever.  HENT: Negative for congestion and facial swelling.   Eyes: Negative for discharge and visual disturbance.  Respiratory: Negative for shortness of breath.   Cardiovascular: Negative for chest  pain and palpitations.  Gastrointestinal: Negative for abdominal pain, diarrhea and vomiting.  Musculoskeletal: Positive for neck pain and neck stiffness. Negative for arthralgias and myalgias.  Skin: Negative for color change and rash.  Neurological: Positive for headaches. Negative for tremors and syncope.  Psychiatric/Behavioral: Negative for confusion and dysphoric mood.     Physical Exam Updated Vital Signs BP 102/76   Pulse 64   Temp 98.4 F (36.9 C) (Oral)   Resp 18   SpO2 97%   Physical Exam  Constitutional: He is oriented to person, place, and time. He appears well-developed and well-nourished.  HENT:  Head: Normocephalic and atraumatic.  Eyes: EOM are normal. Pupils are equal, round, and reactive to light.  Neck: Normal range of motion. Neck supple. No JVD present.  Cardiovascular: Normal rate and regular  rhythm.  Exam reveals no gallop and no friction rub.   No murmur heard. Pulmonary/Chest: No respiratory distress. He has no wheezes.  Abdominal: He exhibits no distension. There is no rebound and no guarding.  Musculoskeletal: Normal range of motion.  Neurological: He is alert and oriented to person, place, and time. He has normal strength. No cranial nerve deficit or sensory deficit. He displays a negative Romberg sign. Coordination and gait normal. GCS eye subscore is 4. GCS verbal subscore is 5. GCS motor subscore is 6. He displays no Babinski's sign on the right side. He displays no Babinski's sign on the left side.  Reflex Scores:      Tricep reflexes are 2+ on the right side and 2+ on the left side.      Bicep reflexes are 2+ on the right side and 2+ on the left side.      Brachioradialis reflexes are 2+ on the right side and 2+ on the left side.      Patellar reflexes are 2+ on the right side and 2+ on the left side.      Achilles reflexes are 2+ on the right side and 2+ on the left side. Some neck rigidity initially resolved on reassessment post headache  cocktail  Skin: No rash noted. No pallor.  Psychiatric: He has a normal mood and affect. His behavior is normal.  Nursing note and vitals reviewed.    ED Treatments / Results  Labs (all labs ordered are listed, but only abnormal results are displayed) Labs Reviewed  APTT - Abnormal; Notable for the following:       Result Value   aPTT 39 (*)    All other components within normal limits  CBC - Abnormal; Notable for the following:    WBC 10.9 (*)    All other components within normal limits  DIFFERENTIAL - Abnormal; Notable for the following:    Monocytes Absolute 1.3 (*)    All other components within normal limits  COMPREHENSIVE METABOLIC PANEL - Abnormal; Notable for the following:    Chloride 99 (*)    Glucose, Bld 101 (*)    AST 43 (*)    Total Bilirubin 1.6 (*)    All other components within normal limits  I-STAT CHEM 8, ED - Abnormal; Notable for the following:    Chloride 99 (*)    BUN 24 (*)    Calcium, Ion 1.08 (*)    All other components within normal limits  PROTIME-INR  I-STAT TROPOININ, ED    EKG  EKG Interpretation None       Radiology Ct Head Wo Contrast  Result Date: 07/08/2016 CLINICAL DATA:  65 year old male with headaches for 3 days. Vertigo and un steady gait. History of hypertension and atrial fibrillation. Initial encounter. EXAM: CT HEAD WITHOUT CONTRAST TECHNIQUE: Contiguous axial images were obtained from the base of the skull through the vertex without intravenous contrast. COMPARISON:  None. FINDINGS: Brain: No intracranial hemorrhage. Inferior right cerebellar infarct appears remote. The density of the basilar artery is minimally more dense than internal carotid arteries. Although this may be normal, given the patient's headache, unsteady gait, vertigo and history of atrial fibrillation, MR brain and MR angiogram may be considered for further delineation to exclude thrombus involving the basilar artery and/or acute posterior fossa infarct.  Vascular: As above Skull: No acute abnormality Sinuses/Orbits: Post lens replacement without acute abnormality. Visualized sinuses clear. Other: Negative. IMPRESSION: Inferior right cerebellar infarct appears remote. MR brain and MR  angiogram may be considered to exclude thrombus involving the basilar artery and/or acute posterior fossa infarct as discussed above. These results will be called to the ordering clinician or representative by the Radiologist Assistant, and communication documented in the PACS or zVision Dashboard. Electronically Signed   By: Lacy Duverney M.D.   On: 07/08/2016 12:50   Mr Shirlee Latch NZ Contrast  Result Date: 07/08/2016 CLINICAL DATA:  Initial evaluation for acute headache. Question dense basilar artery on prior CT. EXAM: MRI HEAD WITHOUT CONTRAST MRA HEAD WITHOUT CONTRAST TECHNIQUE: Multiplanar, multiecho pulse sequences of the brain and surrounding structures were obtained without intravenous contrast. Angiographic images of the head were obtained using MRA technique without contrast. COMPARISON:  Prior head CT from earlier the same day. FINDINGS: MRI HEAD FINDINGS Brain: Mild diffuse prominence of the CSF containing spaces is compatible with generalized age-related cerebral atrophy, within normal limits for age. Mild patchy T2/FLAIR hyperintensity within the periventricular white matter, most likely related chronic small vessel ischemic changes, mild for age. No abnormal foci of restricted diffusion to suggest acute or subacute ischemia. Gray-white matter differentiation well maintained. No evidence for acute or chronic intracranial hemorrhage. Wedge-shaped encephalomalacia within the inferior right cerebellar hemisphere compatible with remote right PICA territory infarct. No other evidence for remote ischemia. No mass lesion, midline shift or mass effect. No hydrocephalus. No extra-axial fluid collection. Major dural sinuses are grossly patent. Pituitary gland within normal limits.   Suprasellar region normal. Vascular: Major intracranial vascular flow voids are maintained. Right vertebral artery diminutive/ hypoplastic. Skull and upper cervical spine: Craniocervical junction within normal limits. Visualized upper cervical spine unremarkable without significant stenosis. Bone marrow signal intensity within normal limits. No scalp soft tissue abnormality. Sinuses/Orbits: Globes and orbital soft tissues within normal limits. Patient is status post cataract extraction bilaterally. Mild scattered mucosal thickening within the ethmoidal air cells. Paranasal sinuses are otherwise clear. No mastoid effusion. Inner ear structures grossly normal. MRA HEAD FINDINGS ANTERIOR CIRCULATION: Distal cervical segments of the internal carotid arteries are widely patent with antegrade flow. Petrous, cavernous, and supraclinoid segments widely patent without flow-limiting stenosis or significant atheromatous disease. A1 segments patent. Anterior communicating artery normal. Widely patent azygos ACA present. Anterior cerebral artery subsequently divide at the level of the pericallosal artery. M1 segments widely patent without stenosis or occlusion. MCA bifurcations within normal limits. Distal MCA branches well opacified and symmetric. POSTERIOR CIRCULATION: Left vertebral artery dominant and widely patent to the vertebrobasilar junction. Right vertebral artery diffusely hypoplastic but patent as well without significant stenosis. Posterior inferior cerebral arteries are patent proximally. Vertebrobasilar junction normal. Basilar artery widely patent without stenosis or evidence for thrombosis. Dominant left anterior inferior cerebral artery noted. Superior cerebral arteries are patent bilaterally. Both of the posterior cerebral artery supplied via the basilar artery and are well opacified to their distal aspects. No aneurysm or vascular malformation insert. IMPRESSION: MRI HEAD IMPRESSION: 1. No acute intracranial  process identified. 2. Remote right PICA territory infarct. 3. 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 MRA HEAD IMPRESSION: 1. Normal intracranial MRA. Vertebrobasilar system widely patent without evidence for basilar artery thrombosis. 2. Azygos ACA, a normal anatomic variant. Electronically Signed   By: Rise Mu M.D.   On: 07/08/2016 20:38   Mr Brain Wo Contrast  Result Date: 07/08/2016 CLINICAL DATA:  Initial evaluation for acute headache. Question dense basilar artery on prior CT. EXAM: MRI HEAD WITHOUT CONTRAST MRA HEAD WITHOUT CONTRAST TECHNIQUE: Multiplanar, multiecho pulse sequences of the brain and surrounding structures  were obtained without intravenous contrast. Angiographic images of the head were obtained using MRA technique without contrast. COMPARISON:  Prior head CT from earlier the same day. FINDINGS: MRI HEAD FINDINGS Brain: Mild diffuse prominence of the CSF containing spaces is compatible with generalized age-related cerebral atrophy, within normal limits for age. Mild patchy T2/FLAIR hyperintensity within the periventricular white matter, most likely related chronic small vessel ischemic changes, mild for age. No abnormal foci of restricted diffusion to suggest acute or subacute ischemia. Gray-white matter differentiation well maintained. No evidence for acute or chronic intracranial hemorrhage. Wedge-shaped encephalomalacia within the inferior right cerebellar hemisphere compatible with remote right PICA territory infarct. No other evidence for remote ischemia. No mass lesion, midline shift or mass effect. No hydrocephalus. No extra-axial fluid collection. Major dural sinuses are grossly patent. Pituitary gland within normal limits.  Suprasellar region normal. Vascular: Major intracranial vascular flow voids are maintained. Right vertebral artery diminutive/ hypoplastic. Skull and upper cervical spine: Craniocervical junction within normal limits. Visualized upper  cervical spine unremarkable without significant stenosis. Bone marrow signal intensity within normal limits. No scalp soft tissue abnormality. Sinuses/Orbits: Globes and orbital soft tissues within normal limits. Patient is status post cataract extraction bilaterally. Mild scattered mucosal thickening within the ethmoidal air cells. Paranasal sinuses are otherwise clear. No mastoid effusion. Inner ear structures grossly normal. MRA HEAD FINDINGS ANTERIOR CIRCULATION: Distal cervical segments of the internal carotid arteries are widely patent with antegrade flow. Petrous, cavernous, and supraclinoid segments widely patent without flow-limiting stenosis or significant atheromatous disease. A1 segments patent. Anterior communicating artery normal. Widely patent azygos ACA present. Anterior cerebral artery subsequently divide at the level of the pericallosal artery. M1 segments widely patent without stenosis or occlusion. MCA bifurcations within normal limits. Distal MCA branches well opacified and symmetric. POSTERIOR CIRCULATION: Left vertebral artery dominant and widely patent to the vertebrobasilar junction. Right vertebral artery diffusely hypoplastic but patent as well without significant stenosis. Posterior inferior cerebral arteries are patent proximally. Vertebrobasilar junction normal. Basilar artery widely patent without stenosis or evidence for thrombosis. Dominant left anterior inferior cerebral artery noted. Superior cerebral arteries are patent bilaterally. Both of the posterior cerebral artery supplied via the basilar artery and are well opacified to their distal aspects. No aneurysm or vascular malformation insert. IMPRESSION: MRI HEAD IMPRESSION: 1. No acute intracranial process identified. 2. Remote right PICA territory infarct. 3. 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 MRA HEAD IMPRESSION: 1. Normal intracranial MRA. Vertebrobasilar system widely patent without evidence for basilar  artery thrombosis. 2. Azygos ACA, a normal anatomic variant. Electronically Signed   By: Rise Mu M.D.   On: 07/08/2016 20:38    Procedures Procedures (including critical care time)  Medications Ordered in ED Medications  sodium chloride 0.9 % bolus 1,000 mL (0 mLs Intravenous Stopped 07/08/16 2148)  prochlorperazine (COMPAZINE) injection 10 mg (10 mg Intravenous Given 07/08/16 2034)  diphenhydrAMINE (BENADRYL) injection 25 mg (25 mg Intravenous Given 07/08/16 2030)  diazepam (VALIUM) injection 2.5 mg (2.5 mg Intravenous Given 07/08/16 2032)  ketorolac (TORADOL) 30 MG/ML injection 30 mg (30 mg Intravenous Given 07/08/16 2140)  magnesium sulfate IVPB 2 g 50 mL (0 g Intravenous Stopped 07/08/16 2211)  valproate (DEPACON) 500 mg in dextrose 5 % 50 mL IVPB (0 mg Intravenous Stopped 07/08/16 2348)     Initial Impression / Assessment and Plan / ED Course  I have reviewed the triage vital signs and the nursing notes.  Pertinent labs & imaging results that were available during my care  of the patient were reviewed by me and considered in my medical decision making (see chart for details).  Clinical Course    65 yo M with a headache.  Similar to past headaches but more severe.  Having some significant neck rigidity, though no injury and no unilaterally doubt carotid or vertebral art dissection. No fever, doubt meningitis.  No confusion doubt encephalitis.  Some concern for [facility], though with negative MRI/MRA feel this ruled out as well.  Patients symptoms completely resolved with headache cocktail, neck rigidity resolved as well.  D/c home, pcp and possibly neuro follow up.   3:14 PM:  I have discussed the diagnosis/risks/treatment options with the patient and family and believe the pt to be eligible for discharge home to follow-up with PCP. We also discussed returning to the ED immediately if new or worsening sx occur. We discussed the sx which are most concerning (e.g., stroke like  symptoms, fever) that necessitate immediate return. Medications administered to the patient during their visit and any new prescriptions provided to the patient are listed below.  Medications given during this visit Medications  sodium chloride 0.9 % bolus 1,000 mL (0 mLs Intravenous Stopped 07/08/16 2148)  prochlorperazine (COMPAZINE) injection 10 mg (10 mg Intravenous Given 07/08/16 2034)  diphenhydrAMINE (BENADRYL) injection 25 mg (25 mg Intravenous Given 07/08/16 2030)  diazepam (VALIUM) injection 2.5 mg (2.5 mg Intravenous Given 07/08/16 2032)  ketorolac (TORADOL) 30 MG/ML injection 30 mg (30 mg Intravenous Given 07/08/16 2140)  magnesium sulfate IVPB 2 g 50 mL (0 g Intravenous Stopped 07/08/16 2211)  valproate (DEPACON) 500 mg in dextrose 5 % 50 mL IVPB (0 mg Intravenous Stopped 07/08/16 2348)     The patient appears reasonably screen and/or stabilized for discharge and I doubt any other medical condition or other [facility] requiring further screening, evaluation, or treatment in the ED at this time prior to discharge.    Final Clinical Impressions(s) / ED Diagnoses   Final diagnoses:  Acute intractable headache, unspecified headache type    New Prescriptions Discharge Medication List as of 07/08/2016 10:50 PM       Melene Plan, DO 07/09/16 1514

## 2016-07-12 ENCOUNTER — Other Ambulatory Visit (HOSPITAL_COMMUNITY): Payer: Self-pay | Admitting: Internal Medicine

## 2016-09-19 ENCOUNTER — Encounter (INDEPENDENT_AMBULATORY_CARE_PROVIDER_SITE_OTHER): Payer: Self-pay

## 2016-09-19 ENCOUNTER — Ambulatory Visit (INDEPENDENT_AMBULATORY_CARE_PROVIDER_SITE_OTHER): Payer: PRIVATE HEALTH INSURANCE | Admitting: Internal Medicine

## 2016-09-19 VITALS — BP 130/70 | HR 79 | Ht 67.0 in | Wt 139.0 lb

## 2016-09-19 DIAGNOSIS — I428 Other cardiomyopathies: Secondary | ICD-10-CM

## 2016-09-19 DIAGNOSIS — I481 Persistent atrial fibrillation: Secondary | ICD-10-CM

## 2016-09-19 DIAGNOSIS — I4819 Other persistent atrial fibrillation: Secondary | ICD-10-CM

## 2016-09-19 DIAGNOSIS — I10 Essential (primary) hypertension: Secondary | ICD-10-CM | POA: Diagnosis not present

## 2016-09-19 NOTE — Patient Instructions (Signed)
Medication Instructions:  Your physician recommends that you continue on your current medications as directed. Please refer to the Current Medication list given to you today.   Labwork: None ordered   Testing/Procedures: None ordered   Follow-Up: Your physician wants you to follow-up in: 6 months with Donna Carroll, NP and 12 months with Dr Allred You will receive a reminder letter in the mail two months in advance. If you don't receive a letter, please call our office to schedule the follow-up appointment.   Any Other Special Instructions Will Be Listed Below (If Applicable).     If you need a refill on your cardiac medications before your next appointment, please call your pharmacy.   

## 2016-09-19 NOTE — Progress Notes (Signed)
PCP:  Pamelia Hoit, MD Amy Clegg. NP, [facility]  The patient presents today for routine electrophysiology followup.  He is maintaining sinus rhythm and is unaware of any afib since his last visit.  Today he denies CP, SOB,  positive for intermittent dizziness, no presyncope or syncope, occasional fatigue.  + migraines for which he follows with neurology  Past Medical History:  Diagnosis Date  . Anxiety   . Arthritis   . Chronic systolic CHF (congestive heart failure) (HCC)    a. Dx 09/2013 in setting of AF-RVR; EF 10-15%, possibly tachy-mediated. Cath 09/2013: normal cors. b. TEE: EF 20-25% by echo 10/2013.  Marland Kitchen Dyslipidemia (high LDL; low HDL)   . Elevated liver enzymes   . H/O hematuria   . History of inguinal hernia   . Hypertension   . NICM (nonischemic cardiomyopathy) (HCC)   . Non-compliance    a. Adm 09/2013 after having stopped his meds.  . Paroxysmal atrial fibrillation (HCC)   . Paroxysmal atrial flutter (HCC)    typical appearing  . Pseudoaneurysm following procedure (HCC)    a. RLE pseudoaneurysm after cath, spontaneously decreased in size, did not require compression.  . Sinus bradycardia    a. HR 40's in ER 11/2013 - digoxin stopped, Coreg decreased.  . Tobacco abuse    Past Surgical History:  Procedure Laterality Date  . ABLATION  06-13-14   PVI and CTI by Dr Johney Frame  . ATRIAL FIBRILLATION ABLATION N/A 06/13/2014   Procedure: ATRIAL FIBRILLATION ABLATION;  Surgeon: Gardiner Rhyme, MD;  [facility]: [facility];  Service: Cardiovascular;  Laterality: N/A;  . CARDIOVERSION N/A 10/17/2013   Procedure: CARDIOVERSION;  Surgeon: Thurmon Fair, MD;  Location: [facility] ENDOSCOPY;  Service: Cardiovascular;  Laterality: N/A;  . CHOLECYSTECTOMY    . HERNIA REPAIR  1992 & 2011  . LEFT AND RIGHT HEART CATHETERIZATION WITH CORONARY ANGIOGRAM N/A 10/13/2013   Procedure: LEFT AND RIGHT HEART CATHETERIZATION WITH CORONARY ANGIOGRAM;  Surgeon: Marykay Lex, MD;  Location:  [facility] CATH LAB;  Service: Cardiovascular;  Laterality: N/A;  . LOOP RECORDER IMPLANT N/A 11/11/2013   Procedure: Psuedo Anuerysm Compression;  Surgeon: Pricilla Riffle, MD;  Location: [facility] CATH LAB;  Service: Cardiovascular;  Laterality: N/A;  . TEE WITHOUT CARDIOVERSION N/A 10/17/2013   Procedure: TRANSESOPHAGEAL ECHOCARDIOGRAM (TEE);  Surgeon: Thurmon Fair, MD;  Location: [facility] ENDOSCOPY;  Service: Cardiovascular;  Laterality: N/A;  . TEE WITHOUT CARDIOVERSION N/A 06/12/2014   Procedure: TRANSESOPHAGEAL ECHOCARDIOGRAM (TEE);  Surgeon: Pricilla Riffle, MD;  Location: [facility] ENDOSCOPY;  Service: Cardiovascular;  Laterality: N/A;    Current Outpatient Prescriptions  Medication Sig Dispense Refill  . ALPRAZolam (XANAX) 1 MG tablet Take 1 mg by mouth 2 (two) times daily as needed for anxiety or sleep.     Marland Kitchen amLODipine (NORVASC) 5 MG tablet Take 0.5 tablets (2.5 mg total) by mouth daily. (Patient taking differently: Take 5 mg by mouth daily. ) 30 tablet 3  . carvedilol (COREG) 6.25 MG tablet Take 1 tablet (6.25 mg total) by mouth 2 (two) times daily with a meal. 45 tablet 3  . ELIQUIS 5 MG TABS tablet TAKE 1 TABLET BY MOUTH TWICE A DAY 60 tablet 3  . hydrochlorothiazide (MICROZIDE) 12.5 MG capsule Take 12.5 mg by mouth daily.    Marland Kitchen losartan (COZAAR) 100 MG tablet TAKE 1 TABLET (100 MG TOTAL) BY MOUTH DAILY. 90 tablet 3   No current facility-administered medications for this visit.  Allergies  Allergen Reactions  . Tramadol Nausea Only    Social History   Social History  . Marital status: Married    Spouse name: N/A  . Number of children: N/A  . Years of education: N/A   Occupational History  . [employer] in [employer]  .     Marland Kitchen      Social History Main Topics  . Smoking status: Current Some Day Smoker    Packs/day: 0.50    Years: 40.00    Last attempt to quit: 06/07/2014  . Smokeless tobacco: Not on file     Comment: Has cut back to 2-3 cigarettes per day.  .  Alcohol use 0.0 oz/week     Comment: 1-2 A DAY  . Drug use: Unknown  . Sexual activity: Not on file   Other Topics Concern  . Not on file   Social History Narrative   Lives in [place] with wife.     Family History  Problem Relation Age of Onset  . Hyperlipidemia Mother   . Hypertension Mother   . Heart attack Father     ROS-  All systems are reviewed and are negative except as outlined in the HPI above  Physical Exam: Vitals:   09/19/16 1405  BP: 130/70  Pulse: 79  Weight: 139 lb (63 kg)  Height: 5\' 7"  (1.702 m)    GEN- The patient is well appearing, alert and oriented x 3 today.   Head- normocephalic, atraumatic Eyes-  Sclera clear, conjunctiva pink Ears- hearing intact Oropharynx- clear Neck- supple,   Lungs- clear Heart- regular rate and rhythm  GI- soft, NT, ND, + BS Extremities- no clubbing, cyanosis, edema MS- no significant deformity or atrophy Skin- no rash or lesion Psych- euthymic mood, full affect Neuro- strength and sensation are intact  EKG today reveals sinus rhythm  Assessment and Plan:  1. Persistent afib  Doing well at this time off of AAD therapy No changes I have spoken with the patient's neurologist.  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  Dr Tyler Deis was not certain whether risks/ benefits favored stopping eliquis or not.  Chads2vasc score is at least 4.  I had a long discussion with the patient about options.  I did offer ILR with monitoring for further afib off of anticoagulation as an option.  The patient states that he is currently doing well and favors continuing anticoagulation with eliquis though he does understand risks of bleeding.  2. Severe biventricular failure/ chronic systolic dysfuction EF has recovered with sinus rhythm  3. Htn Stable No change required today  4. Tobacco He smokes occasionally I have advised that  he quit  5. Prior elevated LFTs resolved  Today, I have spent 40 minutes with the patient discussing afib and risks of anticoagulation.  More than 50% of the visit time today was spent on this issue.  Return to see Lupita Leash in the AF clinic in 6 months I will see in 1 year  Hillis Range, MD 09/19/2016 3:08 PM

## 2016-11-30 ENCOUNTER — Other Ambulatory Visit (HOSPITAL_COMMUNITY): Payer: Self-pay | Admitting: Internal Medicine

## 2016-12-01 NOTE — Telephone Encounter (Signed)
Eliquis 5mg  refill requested; pt is 66 yrs old, Crea-1.10 on 07/08/16, 63kg, & last seen by Dr. Johney Frame on 09/19/16. Will send in refill as requested.

## 2017-03-24 ENCOUNTER — Encounter (HOSPITAL_COMMUNITY): Payer: Self-pay | Admitting: Nurse Practitioner

## 2017-03-24 ENCOUNTER — Ambulatory Visit (HOSPITAL_COMMUNITY)
Admission: RE | Admit: 2017-03-24 | Discharge: 2017-03-24 | Disposition: A | Discharge status: Home / Self Care | Payer: Medicare HMO | Source: Ambulatory Visit | Attending: Nurse Practitioner | Admitting: Nurse Practitioner

## 2017-03-24 VITALS — BP 158/92 | HR 75 | Ht 67.0 in | Wt 143.8 lb

## 2017-03-24 DIAGNOSIS — F17210 Nicotine dependence, cigarettes, uncomplicated: Secondary | ICD-10-CM | POA: Diagnosis not present

## 2017-03-24 DIAGNOSIS — I480 Paroxysmal atrial fibrillation: Secondary | ICD-10-CM | POA: Insufficient documentation

## 2017-03-24 DIAGNOSIS — I110 Hypertensive heart disease with heart failure: Secondary | ICD-10-CM | POA: Diagnosis not present

## 2017-03-24 DIAGNOSIS — F419 Anxiety disorder, unspecified: Secondary | ICD-10-CM | POA: Insufficient documentation

## 2017-03-24 DIAGNOSIS — I429 Cardiomyopathy, unspecified: Secondary | ICD-10-CM | POA: Insufficient documentation

## 2017-03-24 DIAGNOSIS — I4892 Unspecified atrial flutter: Secondary | ICD-10-CM | POA: Diagnosis not present

## 2017-03-24 DIAGNOSIS — R748 Abnormal levels of other serum enzymes: Secondary | ICD-10-CM | POA: Diagnosis not present

## 2017-03-24 DIAGNOSIS — I4819 Other persistent atrial fibrillation: Secondary | ICD-10-CM

## 2017-03-24 DIAGNOSIS — I481 Persistent atrial fibrillation: Secondary | ICD-10-CM

## 2017-03-24 DIAGNOSIS — I5022 Chronic systolic (congestive) heart failure: Secondary | ICD-10-CM | POA: Insufficient documentation

## 2017-03-24 DIAGNOSIS — E785 Hyperlipidemia, unspecified: Secondary | ICD-10-CM | POA: Insufficient documentation

## 2017-03-24 MED ORDER — AMLODIPINE BESYLATE 5 MG PO TABS: 7.5000 mg | ORAL_TABLET | Freq: Every day | ORAL | 6 refills | Status: DC

## 2017-03-24 NOTE — Progress Notes (Signed)
Patient ID: Peter Mclean, male   DOB: 1951-01-25, 66 y.o.   MRN: [id number]     Primary Care Physician: Barbie Banner, MD Referring Physician: Dr. [name] is a 66 y.o. male with a h/o afib, TMC, s/p ablation 9/15. He reports today that he he has not been aware of any further afib since procedure. Repeat echo done in August shows return to NSR. He continue on DOAC without bleeding issues . He did have elevated liver enzymes at time of procedure last fall but liver enzymes have normalized by recheck in July 2016.  He returns to afib clinic, 01/21/15 and is doing well from a afib aspect, maintaining SR. His blood pressure has been elevated above 150 mg for the majority of time. He was started on 12.5 mg HCTZ several weeks ago but without any change in BP. He does eat a lot of salt in diet and this was discussed with pt.  F/u in afib clinic 7/10. He reports that he is thought to have had a stroke 3 months ago. Per Dr. Jenel Lucks note, he spoke to Dr. Tyler Deis and he thought it might have been a microhemorhage. Dr. Johney Frame discussed options and pt wanted to stay on current anticoagulation. He denies any afib.  Today, he denies symptoms of palpitations, chest pain, shortness of breath, orthopnea, PND, lower extremity edema, dizziness, presyncope, syncope, or neurologic sequela. The patient is tolerating medications without difficulties and is otherwise without complaint today.   Past Medical History:  Diagnosis Date  . Anxiety   . Arthritis   . Chronic systolic CHF (congestive heart failure) (HCC)    a. Dx 09/2013 in setting of AF-RVR; EF 10-15%, possibly tachy-mediated. Cath 09/2013: normal cors. b. TEE: EF 20-25% by echo 10/2013.  Marland Kitchen Dyslipidemia (high LDL; low HDL)   . Elevated liver enzymes   . H/O hematuria   . History of inguinal hernia   . Hypertension   . NICM (nonischemic cardiomyopathy) (HCC)   . Non-compliance    a. Adm 09/2013 after having stopped his meds.  . Paroxysmal  atrial fibrillation (HCC)   . Paroxysmal atrial flutter (HCC)    typical appearing  . Pseudoaneurysm following procedure (HCC)    a. RLE pseudoaneurysm after cath, spontaneously decreased in size, did not require compression.  . Sinus bradycardia    a. HR 40's in ER 11/2013 - digoxin stopped, Coreg decreased.  . Tobacco abuse    Past Surgical History:  Procedure Laterality Date  . ABLATION  06-13-14   PVI and CTI by Dr Johney Frame  . ATRIAL FIBRILLATION ABLATION N/A 06/13/2014   Procedure: ATRIAL FIBRILLATION ABLATION;  Surgeon: Gardiner Rhyme, MD;  [facility]: [facility] CATH LAB;  Service: Cardiovascular;  Laterality: N/A;  . CARDIOVERSION N/A 10/17/2013   Procedure: CARDIOVERSION;  Surgeon: Thurmon Fair, MD;  Location: [facility] ENDOSCOPY;  Service: Cardiovascular;  Laterality: N/A;  . CHOLECYSTECTOMY    . HERNIA REPAIR  1992 & 2011  . LEFT AND RIGHT HEART CATHETERIZATION WITH CORONARY ANGIOGRAM N/A 10/13/2013   Procedure: LEFT AND RIGHT HEART CATHETERIZATION WITH CORONARY ANGIOGRAM;  Surgeon: Marykay Lex, MD;  Location: [facility] CATH LAB;  Service: Cardiovascular;  Laterality: N/A;  . LOOP RECORDER IMPLANT N/A 11/11/2013   Procedure: Psuedo Anuerysm Compression;  Surgeon: Pricilla Riffle, MD;  Location: [facility] CATH LAB;  Service: Cardiovascular;  Laterality: N/A;  . TEE WITHOUT CARDIOVERSION N/A 10/17/2013   Procedure: TRANSESOPHAGEAL ECHOCARDIOGRAM (TEE);  Surgeon: Thurmon Fair, MD;  Location:  [facility] ENDOSCOPY;  Service: Cardiovascular;  Laterality: N/A;  . TEE WITHOUT CARDIOVERSION N/A 06/12/2014   Procedure: TRANSESOPHAGEAL ECHOCARDIOGRAM (TEE);  Surgeon: Pricilla Riffle, MD;  Location: [facility] ENDOSCOPY;  Service: Cardiovascular;  Laterality: N/A;    Current Outpatient Prescriptions  Medication Sig Dispense Refill  . ALPRAZolam (XANAX) 1 MG tablet Take 1 mg by mouth 2 (two) times daily as needed for anxiety or sleep.     Marland Kitchen amLODipine (NORVASC) 5 MG tablet Take 5 mg by mouth daily.    . carvedilol (COREG) 6.25 MG tablet Take  1 tablet (6.25 mg total) by mouth 2 (two) times daily with a meal. 45 tablet 3  . ELIQUIS 5 MG TABS tablet TAKE 1 TABLET BY MOUTH TWICE A DAY 60 tablet 5  . gabapentin (NEURONTIN) 100 MG capsule 1 capsule 3 (three) times daily.   0  . hydrochlorothiazide (MICROZIDE) 12.5 MG capsule Take 12.5 mg by mouth daily.    Marland Kitchen HYDROcodone-acetaminophen (NORCO/VICODIN) 5-325 MG tablet Take 1 tablet by mouth every 6 (six) hours as needed for moderate pain.    Marland Kitchen losartan (COZAAR) 100 MG tablet TAKE 1 TABLET (100 MG TOTAL) BY MOUTH DAILY. 90 tablet 3   No current facility-administered medications for this encounter.     Allergies  Allergen Reactions  . Tramadol Nausea Only    Social History   Social History  . Marital status: Married    Spouse name: N/A  . Number of children: N/A  . Years of education: N/A   Occupational History  . [employer] in [employer]  .     Marland Kitchen      Social History Main Topics  . Smoking status: Current Some Day Smoker    Packs/day: 0.50    Years: 40.00    Last attempt to quit: 06/07/2014  . Smokeless tobacco: Never Used     Comment: Has cut back to 2-3 cigarettes per day.  . Alcohol use 0.0 oz/week     Comment: 1-2 A DAY  . Drug use: Unknown  . Sexual activity: Not on file   Other Topics Concern  . Not on file   Social History Narrative   Lives in [place] with wife.     Family History  Problem Relation Age of Onset  . Hyperlipidemia Mother   . Hypertension Mother   . Heart attack Father     ROS- All systems are reviewed and negative except as per the HPI above  Physical Exam: Vitals:   03/24/17 1345  BP: (!) 158/92  Pulse: 75  Weight: 143 lb 12.8 oz (65.2 kg)  Height: 5\' 7"  (1.702 m)    GEN- The patient is well appearing, alert and oriented x 3 today.   Head- normocephalic, atraumatic Eyes-  Sclera clear, conjunctiva pink Ears- hearing intact Oropharynx- clear Neck- supple, no JVP Lymph- no cervical  lymphadenopathy Lungs- Clear to ausculation bilaterally, normal work of breathing Heart- Regular rate and rhythm, no murmurs, rubs or gallops, PMI not laterally displaced GI- soft, NT, ND, + BS Extremities- no clubbing, cyanosis, or edema MS- no significant deformity or atrophy Skin- no rash or lesion Psych- euthymic mood, full affect Neuro- strength and sensation are intact  EKG-NSR, LAFB, pr int 146 ms, QRS int 94 ms, QTc 416 ms Epic records reviewed  Echo-Left ventricle: The cavity size was normal. Wall thickness was normal. Systolic function was normal. Wall motion was normal; there were no regional wall motion abnormalities. - Left  atrium: The atrium was mildly dilated. - Right ventricle: The cavity size was normal. Wall thickness was normal. Systolic function was normal. - Tricuspid valve: There was trivial regurgitation. - Pulmonary arteries: PA peak pressure: 21 mm Hg (S).  Assessment and Plan: 1. AFIB Staying in SR Continue carvedilol Continue apixaban  2. LV dysfunction Resolved with SR Continue losartan/BB  3.  Remote abnormal liver enzymes Resolved  4. HTN Not optimal today and pt reports sys BP 140-160 Continue ACE/BB/ HCTZ and increase amlodipine to 7.5 mg   F/u in 6 months with Dr. Johney Frame as scheduled   Elvina Sidle. [facility] [address] [place], [place] [address] [phone]

## 2017-05-07 ENCOUNTER — Other Ambulatory Visit: Payer: Self-pay | Admitting: Internal Medicine

## 2017-07-10 ENCOUNTER — Ambulatory Visit (INDEPENDENT_AMBULATORY_CARE_PROVIDER_SITE_OTHER): Payer: Self-pay | Admitting: Orthopedic Surgery

## 2017-10-02 ENCOUNTER — Encounter: Payer: Self-pay | Admitting: Internal Medicine

## 2017-10-02 ENCOUNTER — Ambulatory Visit: Payer: Medicare HMO | Admitting: Internal Medicine

## 2017-10-02 VITALS — BP 128/80 | HR 68 | Ht 67.0 in | Wt 145.2 lb

## 2017-10-02 DIAGNOSIS — I10 Essential (primary) hypertension: Secondary | ICD-10-CM | POA: Diagnosis not present

## 2017-10-02 DIAGNOSIS — I481 Persistent atrial fibrillation: Secondary | ICD-10-CM | POA: Diagnosis not present

## 2017-10-02 DIAGNOSIS — I4819 Other persistent atrial fibrillation: Secondary | ICD-10-CM

## 2017-10-02 NOTE — Progress Notes (Signed)
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  Since last being seen in our clinic, the patient reports doing very well.  Today, he denies symptoms of palpitations, chest pain, shortness of breath,  lower extremity edema, dizziness, presyncope, or syncope.  The patient is otherwise without complaint today.   Past Medical History:  Diagnosis Date  . Anxiety   . Arthritis   . Chronic systolic CHF (congestive heart failure) (HCC)    a. Dx 09/2013 in setting of AF-RVR; EF 10-15%, possibly tachy-mediated. Cath 09/2013: normal cors. b. TEE: EF 20-25% by echo 10/2013.  Marland Kitchen Dyslipidemia (high LDL; low HDL)   . Elevated liver enzymes   . H/O hematuria   . History of inguinal hernia   . Hypertension   . NICM (nonischemic cardiomyopathy) (HCC)   . Non-compliance    a. Adm 09/2013 after having stopped his meds.  . Paroxysmal atrial fibrillation (HCC)   . Paroxysmal atrial flutter (HCC)    typical appearing  . Pseudoaneurysm following procedure (HCC)    a. RLE pseudoaneurysm after cath, spontaneously decreased in size, did not require compression.  . Sinus bradycardia    a. HR 40's in ER 11/2013 - digoxin stopped, Coreg decreased.  . Tobacco abuse    Past Surgical History:  Procedure Laterality Date  . ABLATION  06-13-14   PVI and CTI by Dr Johney Frame  . ATRIAL FIBRILLATION ABLATION N/A 06/13/2014   Procedure: ATRIAL FIBRILLATION ABLATION;  Surgeon: Gardiner Rhyme, MD;  [facility]: [facility] CATH LAB;  Service: Cardiovascular;  Laterality: N/A;  . CARDIOVERSION N/A 10/17/2013   Procedure: CARDIOVERSION;  Surgeon: Thurmon Fair, MD;  Location: [facility] ENDOSCOPY;  Service: Cardiovascular;  Laterality: N/A;  . CHOLECYSTECTOMY    . HERNIA REPAIR  1992 & 2011  . LEFT AND RIGHT HEART CATHETERIZATION WITH CORONARY ANGIOGRAM N/A 10/13/2013   Procedure: LEFT AND RIGHT HEART CATHETERIZATION WITH CORONARY ANGIOGRAM;  Surgeon: Marykay Lex, MD;  Location[facility] CATH LAB;  Service: Cardiovascular;  Laterality: N/A;  . LOOP RECORDER IMPLANT N/A 11/11/2013   Procedure: Psuedo Anuerysm Compression;  Surgeon: Pricilla Riffle, MD;  Location: [facility] CATH LAB;  Service: Cardiovascular;  Laterality: N/A;  . TEE WITHOUT CARDIOVERSION N/A 10/17/2013   Procedure: TRANSESOPHAGEAL ECHOCARDIOGRAM (TEE);  Surgeon: Thurmon Fair, MD;  Location: [facility] ENDOSCOPY;  Service: Cardiovascular;  Laterality: N/A;  . TEE WITHOUT CARDIOVERSION N/A 06/12/2014   Procedure: TRANSESOPHAGEAL ECHOCARDIOGRAM (TEE);  Surgeon: Pricilla Riffle, MD;  Location: [facility] ENDOSCOPY;  Service: Cardiovascular;  Laterality: N/A;    ROS- all systems are reviewed and negatives except as per HPI above  Current Outpatient Medications  Medication Sig Dispense Refill  . ALPRAZolam (XANAX) 1 MG tablet Take 1 mg by mouth 2 (two) times daily as needed for anxiety or sleep.     Marland Kitchen amLODipine (NORVASC) 5 MG tablet Take 1.5 tablets (7.5 mg total) by mouth daily. 45 tablet 6  . carvedilol (COREG) 6.25 MG tablet Take 1 tablet (6.25 mg total) by mouth 2 (two) times daily with a meal. 45 tablet 3  . ELIQUIS 5 MG TABS tablet TAKE 1 TABLET BY MOUTH TWICE A DAY 60 tablet 5  . gabapentin (NEURONTIN) 100 MG capsule 1 capsule 3 (three) times daily.   0  . hydrochlorothiazide (MICROZIDE) 12.5 MG capsule Take 12.5 mg by mouth daily.    Marland Kitchen HYDROcodone-acetaminophen (NORCO/VICODIN) 5-325 MG tablet  Take 1 tablet by mouth every 6 (six) hours as needed for moderate pain.    Marland Kitchen losartan (COZAAR) 100 MG tablet TAKE 1 TABLET (100 MG TOTAL) BY MOUTH DAILY. 90 tablet 3   No current facility-administered medications for this visit.     Physical Exam: Vitals:   10/02/17 1229  BP: 128/80  Pulse: 68  SpO2: 96%  Weight: 145 lb 3.2 oz (65.9 kg)  Height: 5\' 7"  (1.702 m)    GEN- The patient is well appearing, alert and oriented x 3 today.   Head- normocephalic, atraumatic Eyes-  Sclera clear, conjunctiva pink Ears-  hearing intact Oropharynx- clear Lungs- Clear to ausculation bilaterally, normal work of breathing Heart- Regular rate and rhythm, no murmurs, rubs or gallops, PMI not laterally displaced GI- soft, NT, ND, + BS Extremities- no clubbing, cyanosis, or edema  EKG tracing ordered today is personally reviewed and shows sinus rhythm 67 bpm, incomplete RBBB  Assessment and Plan:  1. persistent afib Well controlled post ablation off AAD therapy chads2vasc score is 4.  Continue eliquis  2. Nonischemic CM Resolved with sinus rhythm  3. HTN Stable No change required today He is quite confused about his BP medicines. Today, I have clarified that he should stop amlodipine and resume losartan 100mg  daily  4. Tobacco Cessation advised  Follow-up with EP PA every 6 months  Hillis Range MD, [facility] 10/02/2017 12:48 PM

## 2017-10-02 NOTE — Patient Instructions (Addendum)
Medication Instructions:  Your physician has recommended you make the following change in your medication:  1) Stop Amlodipine 2) Restart Losartan 100 mg daily   Labwork: None ordered   Testing/Procedures: None ordered   Follow-Up: Your physician wants you to follow-up in: 6 months with Francis Dowse, PA You will receive a reminder letter in the mail two months in advance. If you don't receive a letter, please call our office to schedule the follow-up appointment.    Any Other Special Instructions Will Be Listed Below (If Applicable).     If you need a refill on your cardiac medications before your next appointment, please call your pharmacy.

## 2017-10-19 ENCOUNTER — Ambulatory Visit (INDEPENDENT_AMBULATORY_CARE_PROVIDER_SITE_OTHER): Payer: Medicare HMO

## 2017-10-19 ENCOUNTER — Encounter (INDEPENDENT_AMBULATORY_CARE_PROVIDER_SITE_OTHER): Payer: Self-pay | Admitting: Orthopedic Surgery

## 2017-10-19 ENCOUNTER — Ambulatory Visit (INDEPENDENT_AMBULATORY_CARE_PROVIDER_SITE_OTHER): Payer: Medicare HMO | Admitting: Orthopedic Surgery

## 2017-10-19 DIAGNOSIS — M5441 Lumbago with sciatica, right side: Secondary | ICD-10-CM

## 2017-10-19 DIAGNOSIS — G8929 Other chronic pain: Secondary | ICD-10-CM

## 2017-10-19 DIAGNOSIS — M5442 Lumbago with sciatica, left side: Secondary | ICD-10-CM | POA: Diagnosis not present
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
## 2018-05-20 ENCOUNTER — Ambulatory Visit: Payer: Medicare HMO | Admitting: Physician Assistant

## 2018-06-26 NOTE — Progress Notes (Addendum)
Cardiology Office Note Date:  06/29/2018  Patient ID:  Peter Mclean, Peter Mclean, Peter Mclean, MRN [id number] PCP:  Barbie Banner, MD  Electrophysiologist:  Dr. Johney Frame   Chief Complaint: annual visit  History of Present Illness: Peter Mclean is a 67 y.o. male with history of HTN, NICM, chronic CHF (systolic), bradycardia requiring down-titration of meds, and persistent Afib s/p PVI ablation in 2105, smoker, had transient abn LFTs, normalized.  He mentions a hx of TIA "some years ago" treated at [place], saw Dr. Tyler Deis, neurologist.  Reports on his Eliquis at that time, no changes were made.  In review of notes, Dr. Johney Frame noted: "I have spoken with the patient's neurologist.  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  Dr Tyler Deis was not certain whether risks/ benefits favored stopping eliquis or not.  Chads2vasc score is at least 4.  I had a long discussion with the patient about options.  I did offer ILR with monitoring for further afib off of anticoagulation as an option.  The patient states that he is currently doing well and favors continuing anticoagulation with eliquis though he does understand risks of bleeding."  PMSxHx reports loop implant though the patient states he never decided to pursue the implant.  Unfortunately I am unable to remove it, (our staff is working on getting this corrected)  He comes in today to be seen for Dr. Johney Frame.  He last saw him in Jan 2019.  AT that time doing well, maintaining SR, of AAD.  The patient was confused on his meds, this was addressed, his amlodipine stopped and losartan resumed at 100mg  daily.  Planned for APP visit q 87mo.  The patient physically is doing well.  His wife of 40 years recently passed away and this he continues to struggle with this, missing her very much.  He denies any kind of cardiac awareness, no CP, palpations.  No SOB or DOE, no  dizziness, near syncope or syncope.  No neuro symptoms.  No bleeding or signs of bleeding, he reports compliance with his medicines.   AF history: 06/13/14, EPS, ablation (PVI, RA/SVC junction, and CTI) AAD Hx Amiodarone stopped Oct 2015, notes indicate abn LFTs, jaundice, liver failure, ? gallstone   Past Medical History:  Diagnosis Date  . Anxiety   . Arthritis   . Chronic systolic CHF (congestive heart failure) (HCC)    a. Dx 09/2013 in setting of AF-RVR; EF 10-15%, possibly tachy-mediated. Cath 09/2013: normal cors. b. TEE: EF 20-25% by echo 10/2013.  Marland Kitchen Dyslipidemia (high LDL; low HDL)   . Elevated liver enzymes   . H/O hematuria   . History of inguinal hernia   . Hypertension   . NICM (nonischemic cardiomyopathy) (HCC)   . Non-compliance    a. Adm 09/2013 after having stopped his meds.  . Paroxysmal atrial fibrillation (HCC)   . Paroxysmal atrial flutter (HCC)    typical appearing  . Pseudoaneurysm following procedure (HCC)    a. RLE pseudoaneurysm after cath, spontaneously decreased in size, did not require compression.  . Sinus bradycardia    a. HR 40's in ER 11/2013 - digoxin stopped, Coreg decreased.  . Tobacco abuse     Past Surgical History:  Procedure Laterality Date  . ABLATION  06-13-14   PVI and CTI by Dr Johney Frame  . ATRIAL FIBRILLATION ABLATION N/A 06/13/2014   Procedure: ATRIAL FIBRILLATION ABLATION;  Surgeon: Fayrene Fearing  D Allred, MD;  Location: [facility] CATH LAB;  Service: Cardiovascular;  Laterality: N/A;  . CARDIOVERSION N/A 10/17/2013   Procedure: CARDIOVERSION;  Surgeon: Thurmon Fair, MD;  Location: [facility] ENDOSCOPY;  Service: Cardiovascular;  Laterality: N/A;  . CHOLECYSTECTOMY    . HERNIA REPAIR  1992 & 2011  . LEFT AND RIGHT HEART CATHETERIZATION WITH CORONARY ANGIOGRAM N/A 10/13/2013   Procedure: LEFT AND RIGHT HEART CATHETERIZATION WITH CORONARY ANGIOGRAM;  Surgeon: Marykay Lex, MD;  Location: [facility] CATH LAB;  Service: Cardiovascular;  Laterality: N/A;  . LOOP RECORDER  IMPLANT N/A 11/11/2013   Procedure: Psuedo Anuerysm Compression;  Surgeon: Pricilla Riffle, MD;  Location: [facility] CATH LAB;  Service: Cardiovascular;  Laterality: N/A;  . TEE WITHOUT CARDIOVERSION N/A 10/17/2013   Procedure: TRANSESOPHAGEAL ECHOCARDIOGRAM (TEE);  Surgeon: Thurmon Fair, MD;  Location: [facility] ENDOSCOPY;  Service: Cardiovascular;  Laterality: N/A;  . TEE WITHOUT CARDIOVERSION N/A 06/12/2014   Procedure: TRANSESOPHAGEAL ECHOCARDIOGRAM (TEE);  Surgeon: Pricilla Riffle, MD;  Location: [facility] ENDOSCOPY;  Service: Cardiovascular;  Laterality: N/A;    Current Outpatient Medications  Medication Sig Dispense Refill  . ALPRAZolam (XANAX) 1 MG tablet Take 1 mg by mouth 2 (two) times daily as needed for anxiety or sleep.     . carvedilol (COREG) 6.25 MG tablet Take 1 tablet (6.25 mg total) by mouth 2 (two) times daily with a meal. 45 tablet 3  . ELIQUIS 5 MG TABS tablet TAKE 1 TABLET BY MOUTH TWICE A DAY 60 tablet 5  . gabapentin (NEURONTIN) 100 MG capsule 1 capsule 3 (three) times daily.   0  . hydrochlorothiazide (MICROZIDE) 12.5 MG capsule Take 12.5 mg by mouth daily.    Marland Kitchen HYDROcodone-acetaminophen (NORCO/VICODIN) 5-325 MG tablet Take 1 tablet by mouth every 6 (six) hours as needed for moderate pain.    Marland Kitchen losartan (COZAAR) 100 MG tablet TAKE 1 TABLET (100 MG TOTAL) BY MOUTH DAILY. 90 tablet 3   No current facility-administered medications for this visit.     Allergies:   Tramadol   Social History:  The patient  reports that he has been smoking. He has a 20.00 pack-year smoking history. He has never used smokeless tobacco. He reports that he drinks alcohol.   Family History:  The patient's family history includes Heart attack in his father; Hyperlipidemia in his mother; Hypertension in his mother.  ROS:  Please see the history of present illness.  All other systems are reviewed and otherwise negative.   PHYSICAL EXAM: VS:  BP 132/80   Pulse 67   Ht 5\' 7"  (1.702 m)   Wt 148 lb (67.1 kg)   BMI  23.18 kg/m  BMI: Body mass index is 23.18 kg/m. Well nourished, well developed, in no acute distress  HEENT: normocephalic, atraumatic  Neck: no JVD, carotid bruits or masses Cardiac:  RRR; no significant murmurs, no rubs, or gallops Lungs:  CTA b/l, no wheezing, rhonchi or rales  Abd: soft, nontender MS: no deformity or atrophy Ext: no edema  Skin: warm and dry, no rash Neuro:  No gross deficits appreciated Psych: euthymic mood, full affect   EKG:  Not done today  8/Mclean/Mclean: TTE Study Conclusions - Left ventricle: The cavity size was normal. Wall thickness was   normal. Systolic function was normal. Wall motion was normal;   there were no regional wall motion abnormalities. - Left atrium: The atrium was mildly dilated. - Right ventricle: The cavity size was normal. Wall thickness was   normal. Systolic function was  normal. - Tricuspid valve: There was trivial regurgitation. - Pulmonary arteries: PA peak pressure: 21 mm Hg (S).   06/13/14:EPS/ablation CONCLUSIONS: 1. Sinus rhythm upon presentation.   2. 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  There was a right middle pulmonary vein which was isolated with the right inferior pulmonary vein.  The right superior pulmonary vein appeared to be the culprit vein and afib was terminated with isolation of this vein.  Additional right atrial ablation was performed at the junction of the SVC and RA. 3. Successful electrical isolation and anatomical encircling of all four pulmonary veins with radiofrequency current.    4. Cavo-tricuspid isthmus ablation was performed with complete bidirectional isthmus block achieved.  5. No inducible arrhythmias following ablation both on and off of dobutamine 6. No early apparent complications.   Recent Labs: No results found for requested labs within last 8760 hours.  No results found for requested labs  within last 8760 hours.   CrCl cannot be calculated (Patient's most recent lab result is older than the maximum 21 days allowed.).   Wt Readings from Last 3 Encounters:  06/29/18 148 lb (67.1 kg)  10/02/17 145 lb 3.2 oz (65.9 kg)  03/24/17 143 lb 12.8 oz (65.2 kg)     Other studies reviewed: Additional studies/records reviewed today include: summarized above  ASSESSMENT AND PLAN:  1. Persistant AFib     CHA2DS2Vasc is  5, on Eliquis, appropriately dosed by last available labs     no symptoms to suggest recurrent AF     CBC and BMET today  2. NICM     2016, normalized LVEF     On BB/ARB, diuretic, no exam findings of symptoms to suggest fluid OL  3. HTN     Looks good, no changes   Disposition: F/u with him in 25mo, sooner if needed  Adend: to correct CHA2DS2Vasac score to 5  Current medicines are reviewed at length with the patient today.  The patient did not have any concerns regarding medicines.  Norma Fredrickson, PA-C 06/29/2018 8:44 AM     [facility] [address] [place] [address] [phone] (office)  [phone] (fax)

## 2018-06-29 ENCOUNTER — Ambulatory Visit: Payer: Medicare HMO | Admitting: Physician Assistant

## 2018-06-29 VITALS — BP 132/80 | HR 67 | Ht 67.0 in | Wt 148.0 lb

## 2018-06-29 DIAGNOSIS — I10 Essential (primary) hypertension: Secondary | ICD-10-CM | POA: Diagnosis not present

## 2018-06-29 DIAGNOSIS — Z79899 Other long term (current) drug therapy: Secondary | ICD-10-CM | POA: Diagnosis not present

## 2018-06-29 DIAGNOSIS — I4819 Other persistent atrial fibrillation: Secondary | ICD-10-CM | POA: Diagnosis not present

## 2018-06-29 DIAGNOSIS — I428 Other cardiomyopathies: Secondary | ICD-10-CM | POA: Diagnosis not present

## 2018-06-29 NOTE — Patient Instructions (Signed)
Medication Instructions:   Your physician recommends that you continue on your current medications as directed. Please refer to the Current Medication list given to you today.    If you need a refill on your cardiac medications before your next appointment, please call your pharmacy.  Labwork: BMET AND CBC TODAY    Testing/Procedures: NONE ORDERED  TODAY   Follow-Up: Your physician wants you to follow-up in:  IN  6  MONTHS WITH DR ALLRED/URSUY  You will receive a reminder letter in the mail two months in advance. If you don't receive a letter, please call our office to schedule the follow-up appointment.      Any Other Special Instructions Will Be Listed Below (If Applicable).

## 2018-06-30 LAB — CBC
Hematocrit: 43.9 % (ref 37.5–51.0)
Hemoglobin: 14.7 g/dL (ref 13.0–17.7)
MCH: 29.8 pg (ref 26.6–33.0)
MCHC: 33.5 g/dL (ref 31.5–35.7)
MCV: 89 fL (ref 79–97)
Platelets: 214 10*3/uL (ref 150–450)
RBC: 4.93 x10E6/uL (ref 4.14–5.80)
RDW: 12.5 % (ref 12.3–15.4)
WBC: 8.8 10*3/uL (ref 3.4–10.8)

## 2018-06-30 LAB — BASIC METABOLIC PANEL
BUN/Creatinine Ratio: 14 (ref 10–24)
BUN: 15 mg/dL (ref 8–27)
CO2: 27 mmol/L (ref 20–29)
Calcium: 9.5 mg/dL (ref 8.6–10.2)
Chloride: 98 mmol/L (ref 96–106)
Creatinine, Ser: 1.1 mg/dL (ref 0.76–1.27)
GFR calc Af Amer: 80 mL/min/{1.73_m2} (ref >59)
GFR calc non Af Amer: 69 mL/min/{1.73_m2} (ref >59)
Glucose: 96 mg/dL (ref 65–99)
Potassium: 5.1 mmol/L (ref 3.5–5.2)
Sodium: 140 mmol/L (ref 134–144)

## 2018-07-22 ENCOUNTER — Other Ambulatory Visit: Payer: Self-pay | Admitting: Nurse Practitioner

## 2018-07-23 ENCOUNTER — Encounter (HOSPITAL_COMMUNITY): Payer: Self-pay | Admitting: Internal Medicine

## 2019-04-17 NOTE — Progress Notes (Signed)
Cardiology Office Note Date:  04/19/2019  Patient ID:  Peter Mclean, DOB 06/18/1951, MRN [id number] PCP:  Avena, Fred H, MD  Electrophysiologist:  Dr. Allred   Chief Complaint: routine follow up   History of Present Illness: Peter Mclean is a 68 y.o. male with history of HTN, NICM, chronic CHF (systolic), bradycardia requiring down-titration of meds, and persistent Afib s/p PVI ablation in 2105, smoker, had transient abn LFTs, normalized.  He mentions a hx of TIA "some years ago" treated at [place], saw Dr. Wheeler, neurologist.  Reports on his Eliquis at that time, no changes were made.  In review of notes, Dr. Allred noted: "I have spoken with the patient's neurologist.  The patient has prior infarct on MRI however Dr Wheeler (neurology) feels that recent migraine in October could have been a microhemorrhage.  Dr Wheeler was not certain whether risks/ benefits favored stopping eliquis or not.  Chads2vasc score is at least 4.  I had a long discussion with the patient about options.  I did offer ILR with monitoring for further afib off of anticoagulation as an option.  The patient states that he is currently doing well and favors continuing anticoagulation with eliquis though he does understand risks of bleeding."  PMSxHx reports loop implant though the patient states he never decided to pursue the implant.  Unfortunately I am unable to remove it, (our staff is working on getting this corrected)  He comes in today to be seen for Dr. Allred.  He last saw him in Jan 2019.  At that time doing well, maintaining SR, off AAD.  The patient was confused on his meds, this was addressed, his amlodipine stopped and losartan resumed at 100mg  daily.  Planned for APP visit q 91mo(716Lanai Community HospitCristArv39Harrold 2DoNormaKentuckynSherrie MustacMountaiIllinoisIndianan [address]9, physically was doing well.  His wife of 40 years had recently passed away and this he continued to struggle with this, missing her very much.  He denied any kind of ca17mod220[facility] Medical CentCristArv31Harrold 40DoNormaKentuckynSherrie IllinoisIndia78moa425Jackson Medical CentCristArv39Harrold 30DoNormaKentuckynSherrie MustacMiIllinoisIndianass[address]  palpations.  No SOB or DOE, no dizziness, near syncope or syncope.  No neuro symptoms.  No bleeding or signs of bleeding, he reports compliance with his medicines.  He is doing well.  Has lost some weight.  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  He feels well, denies any cardiac awareness, no CP, palpitations, no exertional intolerances, no SOB or DOE.  He does mention when at work crouched down working on a machine, occassionally when he stands up he is very lightheaded and takes a few seconds to recover.  He has not fainted, but once did nearly.  He denies near syncope otherwise.  He does not feel like he has had any AF  He is doing well with his Eliquis, reports compliance, no bleeding or signs of bleeding  He is seeing his PMD Q 60mGeorgianaCarollee HertNorth Shore Endoscopy Center LL(31 [phone] Infirmar82moGeorgianaCarollee HertYork Hospita85 50143Kor93moM785[facility] WeCristArv42Harrold 14DoNormaKentuckynSherrie MIllinoisIndianaus[address].ial Hospita50moGeorgianaCarollee HertEye Surgery Center Of [place] LL(7184moM864Compass Behavioral Center Of AlexandrCristArv17Harrold 46DoNormaKentuckynSherrie MuIllinoisIndi94mon805Ad Hospital East LCristArv53Harrold 76DoNormaKentuckynSherrie MusIllinoisIndianata[address]miscot County Health Cente23moGeorgianaCarollee HertLovelace Westside Hospita(50 28563moo[address] FrancCristArv38Harrold 48DoNormaKentuckynSherrieIllinoisIndi71mon(812Isurgery LCristArv20Harrold 49DoNormaKentuckynSherrie MIllinoisIndianaus991 15moo(631[facility] LLC Dba Raritan Valley Surgery CentCristArv83Harrold 39DoNormaKentuckynSherrie MustaIllinoisIndianacB[address]GeorgianaCarollee HertLeesburg Rehabilitation Hospita(20 (513KoreAsc Tcg LL59moGeorgianaCarollee HertThedacare Medical Center Berli50 (51361KoreMonroeville 29mom682Delta [facility] CristArv24Harrold 59DoNormaKentuckynSherrieIllinoisIndiana M[address]ter LL59moGeorgianaCarollee HertTexas Health Arlington Memorial Hospita30 (317(410KoreBoulder City Hospita50moGeorgianaCarollee HertHoward County Medical Cente61<MEASUREMENT59mo6(212Patient’[facility] Humphreys CounCristArv4Harrold 30DoNormaKentuckynSherrie MustaIllinoisIndianacF[address] Surgery Ct57moGeorgianaCarollee HertLawrence Memorial Hospita93 21995KoreVa Med72moc(928)Ellsworth Municipal HospitCristArv75Harrold 75DoNormaKentuckynSherriIllinoisIndia46moa(475Dover Emergency RoCristArv98Harrold 24DoNormaKentuckynSherrie MustaIllinoisIndianacA7828 [address] Divisio38moGeorgianaCarollee HertMitchell County Memorial Hospita93 ([phone])42KoreHu-Hu-Kam Memorial Hospital (Sacaton62moGeorgianaCarollee HertCentura Health-Penrose [facility](93108mo<(405Magnolia Surgery CentCristArv75Harrold 33DoNormaKentuckynSherrie MustacIllinois49mon626Arnot Ogden Medical CentCristArv27Harrold 12DoNormaKentuckynSherrie MustaIllinoisIndianac61mo7(201Iowa City [facility] LCristArv92Harrold 15DoNormaKentuckynSherrie MustacIllinoisIn71moi313San Leandro Surgery Center Ltd A California Limited PartnershCristArv63Harrold 28DoNormaKentuckynSherrie MustaIllinoisIn58moi707St Joseph'S Women'S HospitCristArv32Harrold 34DoNormaKentuckynSherrie MusIllinoisIndia23moa2Arkansas Surgical HospitCristArv29Harrold 20DoNormaKentuckynSherrie MIllinoisIndianaus[address]9KorePleasant Valley Hospita29moGeorgianaCarollee HertRaulerson Hospita70 979(66KoreSouth Central Ks Med Cente77moGeorgianaCarollee HertStory County Hospita85 (419)(76KoreCha25mol629Naples Eye Surgery CentCristArv21Harrold 33DoNormaKentuckynSherrie MustacLIllinoisIndi54mon(213)[facility] ScrevCristArv3Harrold 3DoNormaKentuckynSherrie MustaIllinoisI38mod413Ascension Borgess HospitCristArv35Harrold 63DoNormaKentuckynSherrie MIllinoisIndianaus81 Old York Laney And Hepatology PLL72moGeorgianaCarollee HertAdventhealth Hendersonvill75<MEASURE74moE(239Belau National HospitCristArv44Harrold 87DoNormaKentuckynSherrie MustacIllinoisIndiana25mor(605Winnebago HospitCristArv103Harrold 83DoNormaKentuckynSherrie MustacCiIllinoisIndianatr853 Colonial Laneospital Watong58moGeorgianaCarollee HertHackensack Meridian Health Carrie78 60981KoreChestnut Hill Hospita49moGeorgianaCarollee HertMeadows Regional Medical Cente61 (95624KoreThe Betty69moF7Lakeview Behavioral Health SystCristArv91Harrold 33DoNormaKentuckynSherrie MustIllinoisIndi25mon406Lake'S Crossing CentCristArv9Harrold 57DoNormaKentuckynSherrie MustIllinoisIndianaac9110 Oklahoma DrivenaCarollee HertNorth Dakota State Hospita60 60981KoreJohnson Regional Medical Cente65moGeorgianaCarollee HertVision Care Center Of Idaho LL98 62622KoreRosebud Health Care Center Hospita35moGeorgianaCarollee HertMusc Health Chester Medical Cente50 26290KoreKindred Hospital South Ba49moGeorgianaCarollee HertVa Roseburg Healthcare Syste58 44351KoreWilliam J Mccord Adolescent Treatment Facilit56moGeorgianaCarollee HertElmhurst Hospital Cente(410 80471KoreLewisgale Hospital Pulask23moGeorgianaCarollee HertHorn Memorial Hospita (605(90KoreMontgomery County Mental Health Treatment Facilit51moGeorgianaCarollee HertPotomac Valley Hospita(35 785KoreBaylor Scott White Surgicare Plan29moGeorgianaCarollee HertAscension Ne Wisconsin St. Elizabeth Hospita48 84772KoreSnellville Eye Surgery Cente11moGeorgianaCarollee HertBoise Endoscopy Center LL22 92047KoreThe Greenwood Endoscopy Center Inc5 [phone] education/precautions were discussed with the patient today    AF history: 06/13/14, EPS, ablation (PVI, RA/SVC junction, and CTI) AAD Hx Amiodarone stopped Oct 2015, notes indicate abn LFTs, jaundice, liver failure, ? gallstone   Past Medical History:  Diagnosis Date  . Anxiety   . Arthritis   . Chronic systolic CHF (congestive heart failure) (HCC)    a. Dx 09/2013 in setting of AF-RVR; EF 10-15%, possibly tachy-mediated. Cath 09/2013: normal cors. b. TEE: EF 20-25% by echo 10/2013.  . Dyslipidemia (high LDL; low HDL)   .  Elevated liver enzymes   . H/O hematuria   . History of inguinal hernia   . Hypertension   . NICM (nonischemic cardiomyopathy) ([place])   . Non-compliance    a. Adm 09/2013 after having stopped his meds.  . Paroxysmal atrial fibrillation ([place])   . Paroxysmal atrial flutter ([place])    typical appearing  . Pseudoaneurysm following procedure ([place])    a. RLE pseudoaneurysm after cath, spontaneously decreased in size, did not require compression.  . Sinus bradycardia    a. HR 40's in ER 11/2013 - digoxin stopped, Coreg decreased.  . Tobacco abuse     Past Surgical History:   Procedure Laterality Date  . ABLATION  06-13-14   PVI and CTI by Dr Rayann Heman  . ATRIAL FIBRILLATION ABLATION N/A 06/13/2014   Procedure: ATRIAL FIBRILLATION ABLATION;  Surgeon: Coralyn Mark, MD;  Location: [facility] CATH LAB;  Service: Cardiovascular;  Laterality: N/A;  . CARDIOVERSION N/A 10/17/2013   Procedure: CARDIOVERSION;  Surgeon: Sanda Klein, MD;  Location: [facility] ENDOSCOPY;  Service: Cardiovascular;  Laterality: N/A;  . CHOLECYSTECTOMY    . [place] 2011  . LEFT AND RIGHT HEART CATHETERIZATION WITH CORONARY ANGIOGRAM N/A 10/13/2013   Procedure: LEFT AND RIGHT HEART CATHETERIZATION WITH CORONARY ANGIOGRAM;  Surgeon: Leonie Man, MD;  Location: [facility] CATH LAB;  Service: Cardiovascular;  Laterality: N/A;  . PSEUDOANERYSM COMPRESSION  11/11/2013   Procedure: PSEUDOANERYSM COMPRESSION;  Surgeon: Fay Records, MD;  Location: [facility] CATH LAB;  Service: Cardiovascular;;  . TEE WITHOUT CARDIOVERSION N/A 10/17/2013   Procedure: TRANSESOPHAGEAL ECHOCARDIOGRAM (TEE);  Surgeon: Sanda Klein, MD;  [facility];  Service: Cardiovascular;  Laterality: N/A;  . TEE WITHOUT CARDIOVERSION N/A 06/12/2014   Procedure: TRANSESOPHAGEAL ECHOCARDIOGRAM (TEE);  Surgeon: Fay Records, MD;  Location: [facility] ENDOSCOPY;  Service: Cardiovascular;  Laterality: N/A;    Current Outpatient Medications  Medication Sig Dispense Refill  . ALPRAZolam (XANAX) 1 MG tablet Take 1 mg by mouth 2 (two) times daily as needed for anxiety or sleep.     . carvedilol (COREG) 6.25 MG tablet Take 1 tablet (6.25 mg total) by mouth 2 (two) times daily with a meal. 45 tablet 3  . ELIQUIS 5 MG TABS tablet TAKE 1 TABLET BY MOUTH TWICE A DAY 60 tablet 5  . gabapentin (NEURONTIN) 100 MG capsule 1 capsule 3 (three) times daily.   0  . hydrochlorothiazide (MICROZIDE) 12.5 MG capsule Take 12.5 mg by mouth daily.    Marland Kitchen HYDROcodone-acetaminophen (NORCO/VICODIN) 5-325 MG tablet Take 1 tablet by mouth every 6 (six) hours as needed for moderate  pain.    Marland Kitchen losartan (COZAAR) 100 MG tablet TAKE 1 TABLET BY MOUTH EVERY DAY 90 tablet 3   No current facility-administered medications for this visit.     Allergies:   Tramadol   Social History:  The patient  reports that he has been smoking. He has a 20.00 pack-year smoking history. He has never used smokeless tobacco. He reports current alcohol use.   Family History:  The patient's family history includes Heart attack in his father; Hyperlipidemia in his mother; Hypertension in his mother.  ROS:  Please see the history of present illness.  All other systems are reviewed and otherwise negative.   PHYSICAL EXAM: VS:  BP 100/68   Pulse 64   Ht 5\' 7"  (1.702 m)   Wt 141 lb (64 kg)   BMI 22.08 kg/m  BMI: Body mass index is 22.08 kg/m. Well nourished,  well developed, in no acute distress  HEENT: normocephalic, atraumatic  Neck: no JVD, carotid bruits or masses Cardiac:  RRR; no significant murmurs, no rubs, or gallops Lungs:  CTA b/l, no wheezing, rhonchi or rales  Abd: soft, nontender MS: no deformity or atrophy Ext: no edema  Skin: warm and dry, no rash Neuro:  No gross deficits appreciated Psych: euthymic mood, full affect   EKG:  Done today and reviewed by myself: SR, normal intervals, no acute or ischemic changes, PAC  05/01/15: TTE Study Conclusions - Left ventricle: The cavity size was normal. Wall thickness was   normal. Systolic function was normal. Wall motion was normal;   there were no regional wall motion abnormalities. - Left atrium: The atrium was mildly dilated. - Right ventricle: The cavity size was normal. Wall thickness was   normal. Systolic function was normal. - Tricuspid valve: There was trivial regurgitation. - Pulmonary arteries: PA peak pressure: 21 mm Hg (S).   06/13/14:EPS/ablation CONCLUSIONS: 1. Sinus rhythm upon presentation.   2. Rotational Angiography reveals a moderate sized left atrium with four separate pulmonary veins without evidence  of pulmonary vein stenosis.  There was a right middle pulmonary vein which was isolated with the right inferior pulmonary vein.  The right superior pulmonary vein appeared to be the culprit vein and afib was terminated with isolation of this vein.  Additional right atrial ablation was performed at the junction of the SVC and RA. 3. Successful electrical isolation and anatomical encircling of all four pulmonary veins with radiofrequency current.    4. Cavo-tricuspid isthmus ablation was performed with complete bidirectional isthmus block achieved.  5. No inducible arrhythmias following ablation both on and off of dobutamine 6. No early apparent complications.   Recent Labs: 06/29/2018: BUN 15; Creatinine, Ser 1.10; Hemoglobin 14.7; Platelets 214; Potassium 5.1; Sodium 140  No results found for requested labs within last 8760 hours.   CrCl cannot be calculated (Patient's most recent lab result is older than the maximum 21 days allowed.).   Wt Readings from Last 3 Encounters:  04/19/19 141 lb (64 kg)  06/29/18 148 lb (67.1 kg)  10/02/17 145 lb 3.2 oz (65.9 kg)     Other studies reviewed: Additional studies/records reviewed today include: summarized above  ASSESSMENT AND PLAN:  1. Persistant AFib     CHA2DS2Vasc is 5, on Eliquis, appropriately dosed by last available labs     symptoms to suggest recurrent AF     Update labs today   2. NICM     2016, normalized LVEF     On BB/ARB, diuretic, no exam findings or symptoms to suggest fluid OL     He describes orthostaic symptoms, BP lowish today (without symptoms) though he reports at checks with his PMD and home is "perfect"   Stop his HCTZ, discussed importance of hydration and oral intake.  He is not eating well.  He will try to do better   3. HTN     As above   Disposition: he will communicate if he has any ongoing orthostatic symptoms with the change in his medicine and with better oral intake.  We will see him back in 1 year,  sooner if needed.     Current medicines are reviewed at length with the patient today.  The patient did not have any concerns regarding medicines.  Norma Fredrickson, PA-C 04/19/2019 8:20 AM     [facility] [address] [place] [address] [phone] (  office)  [phone] (fax)

## 2019-04-18 ENCOUNTER — Telehealth: Payer: Self-pay

## 2019-04-18 NOTE — Telephone Encounter (Signed)

## 2019-04-19 ENCOUNTER — Encounter (INDEPENDENT_AMBULATORY_CARE_PROVIDER_SITE_OTHER): Payer: Self-pay

## 2019-04-19 ENCOUNTER — Other Ambulatory Visit: Payer: Self-pay

## 2019-04-19 ENCOUNTER — Encounter: Payer: Self-pay | Admitting: Physician Assistant

## 2019-04-19 ENCOUNTER — Ambulatory Visit (INDEPENDENT_AMBULATORY_CARE_PROVIDER_SITE_OTHER): Payer: Medicare HMO | Admitting: Physician Assistant

## 2019-04-19 VITALS — BP 100/68 | HR 64 | Ht 67.0 in | Wt 141.0 lb

## 2019-04-19 DIAGNOSIS — Z79899 Other long term (current) drug therapy: Secondary | ICD-10-CM | POA: Diagnosis not present

## 2019-04-19 LAB — CBC
Hematocrit: 41.3 % (ref 37.5–51.0)
Hemoglobin: 13.7 g/dL (ref 13.0–17.7)
MCH: 30.4 pg (ref 26.6–33.0)
MCHC: 33.2 g/dL (ref 31.5–35.7)
MCV: 92 fL (ref 79–97)
Platelets: 206 10*3/uL (ref 150–450)
RBC: 4.5 x10E6/uL (ref 4.14–5.80)
RDW: 12.4 % (ref 11.6–15.4)
WBC: 8.8 10*3/uL (ref 3.4–10.8)

## 2019-04-19 LAB — BASIC METABOLIC PANEL
BUN/Creatinine Ratio: 16 (ref 10–24)
BUN: 17 mg/dL (ref 8–27)
CO2: 27 mmol/L (ref 20–29)
Calcium: 9.4 mg/dL (ref 8.6–10.2)
Chloride: 103 mmol/L (ref 96–106)
Creatinine, Ser: 1.09 mg/dL (ref 0.76–1.27)
GFR calc Af Amer: 80 mL/min/{1.73_m2} (ref >59)
GFR calc non Af Amer: 69 mL/min/{1.73_m2} (ref >59)
Glucose: 99 mg/dL (ref 65–99)
Potassium: 4.6 mmol/L (ref 3.5–5.2)
Sodium: 143 mmol/L (ref 134–144)

## 2019-04-19 LAB — MAGNESIUM: Magnesium: 1.7 mg/dL (ref 1.6–2.3)

## 2019-04-19 NOTE — Patient Instructions (Signed)
Medication Instructions:   STOP TAKING HCTZ    If you need a refill on your cardiac medications before your next appointment, please call your pharmacy.   Lab work: BMET AND MAG TODAY   If you have labs (blood work) drawn today and your tests are completely normal, you will receive your results only by: Marland Kitchen MyChart Message (if you have MyChart) OR . A paper copy in the mail If you have any lab test that is abnormal or we need to change your treatment, we will call you to review the results.  Testing/Procedures: NONE ORDERED  TODAY    Follow-Up: At [facility], you and your health needs are our priority.  As part of our continuing mission to provide you with exceptional heart care, we have created designated Provider Care Teams.  These Care Teams include your primary Cardiologist (physician) and Advanced Practice Providers (APPs -  Physician Assistants and Nurse Practitioners) who all work together to provide you with the care you need, when you need it. You will need a follow up appointment in Dr. Thompson Grayer  1 years.  Please call our office 2 months in advance to schedule this appointment.  You may see  or one of the following Advanced Practice Providers on your designated Care Team:   Chanetta Marshall, NP . Tommye Standard, PA-C  Any Other Special Instructions Will Be Listed Below (If Applicable).  MAKE SURE  YOU KEEP YOUR SELF HYDRATED AND DRINK FLUIDS AND EAT A BALANCE DIET

## 2019-04-19 NOTE — Addendum Note (Signed)
Addended by: Claude Manges on: 04/19/2019 08:52 AM   Modules accepted: Orders

## 2019-11-17 ENCOUNTER — Ambulatory Visit: Payer: Medicare HMO | Attending: Internal Medicine

## 2019-11-17 DIAGNOSIS — Z23 Encounter for immunization: Secondary | ICD-10-CM | POA: Insufficient documentation

## 2019-11-17 NOTE — Progress Notes (Signed)
   Covid-19 Vaccination Clinic  Name:  Dawud Mays    MRN: [id number] DOB: July 30, 1951  11/17/2019  Mr. Mcalexander was observed post Covid-19 immunization for 15 minutes without incident. He was provided with Vaccine Information Sheet and instruction to access the V-Safe system.   Mr. Sanzo was instructed to call 911 with any severe reactions post vaccine: Marland Kitchen Difficulty breathing  . Swelling of face and throat  . A fast heartbeat  . A bad rash all over body  . Dizziness and weakness   Immunizations Administered    Name Date Dose VIS Date Route   Pfizer COVID-19 Vaccine 11/17/2019  8:27 AM 0.3 mL 08/26/2019 Intramuscular   Manufacturer: [employer], [employer]   Lot: [id number]   NDC: [address]-2

## 2019-11-22 ENCOUNTER — Telehealth: Payer: Self-pay | Admitting: Internal Medicine

## 2019-11-22 NOTE — Telephone Encounter (Signed)
Pt c/o of Chest Pain: STAT if CP now or developed within 24 hours  1. Are you having CP right now? Not today- over the weekend he had them   2. Are you experiencing any other symptoms (ex. SOB, nausea, vomiting, sweating)? None of these symptoms, he does have a little dizziness sometimes when he have chest pain  3. How long have you been experiencing CP?  About 2 months, but more frequent  4. Is your CP continuous or coming and going?  Coming and going  5. Have you taken Nitroglycerin? no ?

## 2019-11-22 NOTE — Telephone Encounter (Signed)
I spoke to the patient's daughter who called because the patient has been experiencing CP and SOB.  He is also having dizziness at times.  I scheduled an appointment with Renee Friday for further evaluation.

## 2019-11-25 ENCOUNTER — Other Ambulatory Visit: Payer: Self-pay

## 2019-11-25 ENCOUNTER — Ambulatory Visit: Payer: Medicare HMO | Admitting: Physician Assistant

## 2019-11-25 VITALS — BP 124/72 | HR 73 | Ht 67.0 in | Wt 138.0 lb

## 2019-11-25 DIAGNOSIS — I10 Essential (primary) hypertension: Secondary | ICD-10-CM

## 2019-11-25 DIAGNOSIS — I4819 Other persistent atrial fibrillation: Secondary | ICD-10-CM | POA: Diagnosis not present

## 2019-11-25 DIAGNOSIS — I428 Other cardiomyopathies: Secondary | ICD-10-CM

## 2019-11-25 DIAGNOSIS — R0602 Shortness of breath: Secondary | ICD-10-CM | POA: Diagnosis not present

## 2019-11-25 NOTE — Progress Notes (Signed)
Cardiology Office Note Date:  11/25/2019  Patient ID:  Peter, Mclean 1951-04-19, MRN [id number] PCP:  Peter Banner, MD  Electrophysiologist:  Peter. Johney Mclean   Chief Complaint:  SOB   History of Present Illness: Peter Mclean is a 69 y.o. male with history of HTN, NICM, chronic CHF (systolic) with recovered LVEF, bradycardia requiring down-titration of meds, and persistent Afib s/p PVI ablation in 2105, smoker, had transient abn LFTs, normalized.  He mentions a hx of TIA "some years ago" treated at [place], saw Peter. Tyler Mclean, neurologist.  Reports on his Eliquis at that time, no changes were made.  In review of notes, Peter. Johney Mclean noted: "I have spoken with the patient's neurologist.  The patient has prior infarct on MRI however Peter Peter Mclean (neurology) feels that recent migraine in October could have been a microhemorrhage.  Peter Peter Mclean was not certain whether risks/ benefits favored stopping eliquis or not.  Chads2vasc score is at least 4.  I had a long discussion with the patient about options.  I did offer ILR with monitoring for further afib off of anticoagulation as an option.  The patient states that he is currently doing well and favors continuing anticoagulation with eliquis though he does understand risks of bleeding."  [facility] reports loop implant though the patient states he never decided to pursue the implant.  Unfortunately I am unable to remove it, (our staff is working on getting this corrected)  He comes in today to be seen for Peter. Johney Mclean.  He last saw him in Jan 2019.  At that time doing well, maintaining SR, off AAD.  The patient was confused on his meds, this was addressed, his amlodipine stopped and losartan resumed at 100mg  daily.  Planned for APP visit q 53mo.  I saw him Oct 2019, physically was doing well.  His wife of 40 years had recently passed away he continued to struggle with this, missing her very much.  He denied any kind of cardiac awareness, no CP,  palpations.  No SOB or DOE, no dizziness, near syncope or syncope.  No neuro symptoms.  No bleeding or signs of bleeding, he reports compliance with his medicines.  I saw him again Aug 2020, he was doing well.  Had lost some weight.  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  He felt well, denied any cardiac awareness, no CP, palpitations, no exertional intolerances, no SOB or DOE.  He mentioned when at work crouched down working on a machine, occassionally when he stands up he is very lightheaded and takes a few seconds to recover.  He had not fainted, but once nearly did.  He denied near syncope otherwise.  He had not feelt like he had any AF. He was doing well with his Eliquis, reported compliance, no bleeding or signs of bleeding He was seeing his PMD Q 50mo His BP was 100/68 and given orthostatic symptoms and near syncope, normal LVEF by his last echo (2016) his HCTZ was stopped and encouraged to adequately hydrate.   His appointment scheduled today with reports from his daughter that he has been having CP, SOB and dizziness.  The patient absolutely denies this to me.  He says the dizziness that he discussed with me last visit, off the HCTZ got much better, though he will occasionally still after being stooped down upon standing fleetingly feel lightheaded, not nearly like it was before.  He  denies any near syncope or syncope. He absolutely denies any CP to me, tells me if he was he would tell me and doesn't know why his daughter said that. He denies any rest SON, no symptoms or PND or orthopnea. Says he walks all day at work without DOE, no SOB with his ADLs.  I asked what level of exertion does it require for him to feel SOB or the need to catch his breath and he gives a description of walking around hi cul-de-sac that requires him to walk up two "pretty good inclines" and will be breathing heavy at the end. He says this is not new or  different. Admits he can not do what he could some years ago, but nothing new of late and says he feels good. He admits his knees will bother him.  What he does admit that he is having trouble with is poor appetite and intake.  Says he just doesn't evre really feel hungry.  He has discussed this with his PMD and is drinking an Ensure with his AM pills and mentions that they discussed perhaps appetite stimulants He would like to gain weight  He denies any bleeding or signs of bleeding   AF history: 06/13/14, EPS, ablation (PVI, RA/SVC junction, and CTI)  AAD Hx Amiodarone stopped Oct 2015, notes indicate abn LFTs, jaundice, liver failure, ? gallstone   Past Medical History:  Diagnosis Date  . Anxiety   . Arthritis   . Chronic systolic CHF (congestive heart failure) ([place])    a. Dx 09/2013 in setting of AF-RVR; EF 10-15%, possibly tachy-mediated. Cath 09/2013: normal cors. b. TEE: EF 20-25% by echo 10/2013.  Marland Kitchen Dyslipidemia (high LDL; low HDL)   . Elevated liver enzymes   . H/O hematuria   . History of inguinal hernia   . Hypertension   . NICM (nonischemic cardiomyopathy) ([place])   . Non-compliance    a. Adm 09/2013 after having stopped his meds.  . Paroxysmal atrial fibrillation ([place])   . Paroxysmal atrial flutter ([place])    typical appearing  . Pseudoaneurysm following procedure ([place])    a. RLE pseudoaneurysm after cath, spontaneously decreased in size, did not require compression.  . Sinus bradycardia    a. HR 40's in ER 11/2013 - digoxin stopped, Coreg decreased.  . Tobacco abuse     Past Surgical History:  Procedure Laterality Date  . ABLATION  06-13-14   PVI and CTI by Peter Mclean  . ATRIAL FIBRILLATION ABLATION N/A 06/13/2014   Procedure: ATRIAL FIBRILLATION ABLATION;  Surgeon: Peter Mark, MD;  Location: [facility] CATH LAB;  Service: Cardiovascular;  Laterality: N/A;  . CARDIOVERSION N/A 10/17/2013   Procedure: CARDIOVERSION;  Surgeon: Peter Klein, MD;  Location[facility] ENDOSCOPY;   Service: Cardiovascular;  Laterality: N/A;  . CHOLECYSTECTOMY    . [place] 2011  . LEFT AND RIGHT HEART CATHETERIZATION WITH CORONARY ANGIOGRAM N/A 10/13/2013   Procedure: LEFT AND RIGHT HEART CATHETERIZATION WITH CORONARY ANGIOGRAM;  Surgeon: Leonie Man, MD;  [facility] CATH LAB;  Service: Cardiovascular;  Laterality: N/A;  . PSEUDOANERYSM COMPRESSION  11/11/2013   Procedure: PSEUDOANERYSM COMPRESSION;  Surgeon: Fay Records, MD;  Location: [facility] CATH LAB;  Service: Cardiovascular;;  . TEE WITHOUT CARDIOVERSION N/A 10/17/2013   Procedure: TRANSESOPHAGEAL ECHOCARDIOGRAM (TEE);  Surgeon: Peter Klein, MD;  Location: [facility] ENDOSCOPY;  Service: Cardiovascular;  Laterality: N/A;  . TEE WITHOUT CARDIOVERSION N/A 06/12/2014   Procedure: TRANSESOPHAGEAL ECHOCARDIOGRAM (TEE);  Surgeon: Dorris Carnes  V, MD;  Location: [facility] ENDOSCOPY;  Service: Cardiovascular;  Laterality: N/A;    Current Outpatient Medications  Medication Sig Dispense Refill  . ALPRAZolam (XANAX) 1 MG tablet Take 1 mg by mouth 2 (two) times daily as needed for anxiety or sleep.     . carvedilol (COREG) 6.25 MG tablet Take 1 tablet (6.25 mg total) by mouth 2 (two) times daily with a meal. 45 tablet 3  . ELIQUIS 5 MG TABS tablet TAKE 1 TABLET BY MOUTH TWICE A DAY 60 tablet 5  . gabapentin (NEURONTIN) 100 MG capsule 1 capsule 3 (three) times daily.   0  . HYDROcodone-acetaminophen (NORCO/VICODIN) 5-325 MG tablet Take 1 tablet by mouth every 6 (six) hours as needed for moderate pain.    Marland Kitchen losartan (COZAAR) 100 MG tablet TAKE 1 TABLET BY MOUTH EVERY DAY 90 tablet 3   No current facility-administered medications for this visit.    Allergies:   Tramadol   Social History:  The patient  reports that he has been smoking. He has a 20.00 pack-year smoking history. He has never used smokeless tobacco. He reports current alcohol use.   Family History:  The patient's family history includes Heart attack in his father; Hyperlipidemia in  his mother; Hypertension in his mother.  ROS:  Please see the history of present illness.  All other systems are reviewed and otherwise negative.   PHYSICAL EXAM: VS:  There were no vitals taken for this visit. BMI: There is no height or weight on file to calculate BMI. Well nourished, well developed, in no acute distress  HEENT: normocephalic, atraumatic  Neck: no JVD, carotid bruits or masses Cardiac:  RRR; no significant murmurs, no rubs, or gallops Lungs: CTA b/l, no wheezing, rhonchi or rales  Abd: soft, nontender MS: no deformity or atrophy Ext: no edema  Skin: warm and dry, no rash Neuro:  No gross deficits appreciated Psych: euthymic mood, full affect   EKG:  Done today and reviewed by myself: SR, PACs, no notable change from prior, no ischemic changes  05/01/15: TTE Study Conclusions - Left ventricle: The cavity size was normal. Wall thickness was   normal. Systolic function was normal. Wall motion was normal;   there were no regional wall motion abnormalities. - Left atrium: The atrium was mildly dilated. - Right ventricle: The cavity size was normal. Wall thickness was   normal. Systolic function was normal. - Tricuspid valve: There was trivial regurgitation. - Pulmonary arteries: PA peak pressure: 21 mm Hg (S).   06/13/14:EPS/ablation CONCLUSIONS: 1. Sinus rhythm upon presentation.   2. 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  There was a right middle pulmonary vein which was isolated with the right inferior pulmonary vein.  The right superior pulmonary vein appeared to be the culprit vein and afib was terminated with isolation of this vein.  Additional right atrial ablation was performed at the junction of the SVC and RA. 3. Successful electrical isolation and anatomical encircling of all four pulmonary veins with radiofrequency current.    4. Cavo-tricuspid isthmus  ablation was performed with complete bidirectional isthmus block achieved.  5. No inducible arrhythmias following ablation both on and off of dobutamine 6. No early apparent complications.   Recent Labs: 04/19/2019: BUN 17; Creatinine, Ser 1.09; Hemoglobin 13.7; Magnesium 1.7; Platelets 206; Potassium 4.6; Sodium 143  No results found for requested labs within last 8760 hours.   CrCl cannot be  calculated (Patient's most recent lab result is older than the maximum 21 days allowed.).   Wt Readings from Last 3 Encounters:  04/19/19 141 lb (64 kg)  06/29/18 148 lb (67.1 kg)  10/02/17 145 lb 3.2 oz (65.9 kg)     Other studies reviewed: Additional studies/records reviewed today include: summarized above  ASSESSMENT AND PLAN:  1. Persistant AFib     CHA2DS2Vasc is 5, on Eliquis, appropriately dosed by last available labs     no symptoms to suggest recurrent AF        2. NICM     2016, normalized LVEF     On BB/ARB, no exam findings or symptoms to suggest fluid OL  occassional lightheadedness with standing from a stopped position He drinks a large ice tea a day and an ensure Discussed better hydration and oral intake and to continue with his PMD for help with his appetite       3. HTN    No changes   Disposition: We will see him back in 37mo, sooner if needed     Current medicines are reviewed at length with the patient today.  The patient did not have any concerns regarding medicines.  Peter Fredrickson, PA-C 11/25/2019 5:03 AM     [facility] [address] [place] [address] [phone] (office)  [phone] (fax)

## 2019-11-25 NOTE — Patient Instructions (Signed)
Medication Instructions:  ? ?Your physician recommends that you continue on your current medications as directed. Please refer to the Current Medication list given to you today. ? ?*If you need a refill on your cardiac medications before your next appointment, please call your pharmacy* ? ? ?Lab Work: NONE ORDERED  TODAY ? ? ?If you have labs (blood work) drawn today and your tests are completely normal, you will receive your results only by: ?MyChart Message (if you have MyChart) OR ?A paper copy in the mail ?If you have any lab test that is abnormal or we need to change your treatment, we will call you to review the results. ? ? ?Testing/Procedures: NONE ORDERED  TODAY ? ? ? ?Follow-Up: ?At [facility], you and your health needs are our priority.  As part of our continuing mission to provide you with exceptional heart care, we have created designated Provider Care Teams.  These Care Teams include your primary Cardiologist (physician) and Advanced Practice Providers (APPs -  Physician Assistants and Nurse Practitioners) who all work together to provide you with the care you need, when you need it. ? ?We recommend signing up for the patient portal called "MyChart".  Sign up information is provided on this After Visit Summary.  MyChart is used to connect with patients for Virtual Visits (Telemedicine).  Patients are able to view lab/test results, encounter notes, upcoming appointments, etc.  Non-urgent messages can be sent to your provider as well.   ?To learn more about what you can do with MyChart, go to [email or web address].   ? ?Your next appointment:   ?6 month(s) ? ?The format for your next appointment:   ?In Person ? ?Provider:   ?You may see : Renee Ursuy, PA-C ? ? ? ?Other Instructions ?  ?

## 2019-12-12 ENCOUNTER — Ambulatory Visit: Payer: Medicare HMO | Attending: Internal Medicine

## 2019-12-12 DIAGNOSIS — Z23 Encounter for immunization: Secondary | ICD-10-CM

## 2019-12-12 NOTE — Progress Notes (Signed)
   Covid-19 Vaccination Clinic  Name:  Peter Mclean    MRN: [id number] DOB: 12-06-1950  12/12/2019  [name] was observed post Covid-19 immunization for 15 minutes without incident. He was provided with Vaccine Information Sheet and instruction to access the V-Safe system.   [name] was instructed to call 911 with any severe reactions post vaccine: Marland Kitchen Difficulty breathing  . Swelling of face and throat  . A fast heartbeat  . A bad rash all over body  . Dizziness and weakness   Immunizations Administered    Name Date Dose VIS Date Route   Pfizer COVID-19 Vaccine 12/12/2019  8:09 AM 0.3 mL 08/26/2019 Intramuscular   Manufacturer: [employer], [employer]   Lot: [id number]   NDC: [address]-9

## 2020-05-11 ENCOUNTER — Encounter: Payer: Self-pay | Admitting: Internal Medicine

## 2020-05-11 ENCOUNTER — Ambulatory Visit: Payer: Medicare HMO | Admitting: Internal Medicine

## 2020-05-11 ENCOUNTER — Other Ambulatory Visit: Payer: Self-pay

## 2020-05-11 VITALS — BP 100/66 | HR 76 | Ht 67.0 in | Wt 139.4 lb

## 2020-05-11 DIAGNOSIS — I4819 Other persistent atrial fibrillation: Secondary | ICD-10-CM

## 2020-05-11 DIAGNOSIS — I428 Other cardiomyopathies: Secondary | ICD-10-CM

## 2020-05-11 DIAGNOSIS — I10 Essential (primary) hypertension: Secondary | ICD-10-CM

## 2020-05-11 NOTE — Progress Notes (Signed)
PCP: Barbie Banner, MD   Primary EP: Dr Johney Frame  Peter Mclean is a 69 y.o. male who presents today for routine electrophysiology followup.  Since last being seen in our clinic, the patient reports doing very well.  Today, he denies symptoms of palpitations, chest pain, shortness of breath,  dizziness, presyncope, or syncope.  He has noticed rare edema.  No afib. The patient is otherwise without complaint today.   Past Medical History:  Diagnosis Date  . Anxiety   . Arthritis   . Chronic systolic CHF (congestive heart failure) ([facility])    a. Dx 09/2013 in setting of AF-RVR; EF 10-15%, possibly tachy-mediated. Cath 09/2013: normal cors. b. TEE: EF 20-25% by echo 10/2013.  Marland Kitchen Dyslipidemia (high LDL; low HDL)   . Elevated liver enzymes   . H/O hematuria   . History of inguinal hernia   . Hypertension   . NICM (nonischemic cardiomyopathy) ([facility])   . Non-compliance    a. Adm 09/2013 after having stopped his meds.  . Paroxysmal atrial fibrillation ([facility])   . Paroxysmal atrial flutter ([facility])    typical appearing  . Pseudoaneurysm following procedure ([facility])    a. RLE pseudoaneurysm after cath, spontaneously decreased in size, did not require compression.  . Sinus bradycardia    a. HR 40's in ER 11/2013 - digoxin stopped, Coreg decreased.  . Tobacco abuse    Past Surgical History:  Procedure Laterality Date  . ABLATION  06-13-14   PVI and CTI by Dr Johney Frame  . ATRIAL FIBRILLATION ABLATION N/A 06/13/2014   Procedure: ATRIAL FIBRILLATION ABLATION;  Surgeon: Gardiner Rhyme, MD;  Location: [facility] CATH LAB;  Service: Cardiovascular;  Laterality: N/A;  . CARDIOVERSION N/A 10/17/2013   Procedure: CARDIOVERSION;  Surgeon: Thurmon Fair, MD;  Location: [facility] ENDOSCOPY;  Service: Cardiovascular;  Laterality: N/A;  . CHOLECYSTECTOMY    . HERNIA REPAIR  1992 & 2011  . LEFT AND RIGHT HEART CATHETERIZATION WITH CORONARY ANGIOGRAM N/A 10/13/2013   Procedure: LEFT AND RIGHT HEART CATHETERIZATION WITH CORONARY  ANGIOGRAM;  Surgeon: Marykay Lex, MD;  Location: [facility] CATH LAB;  Service: Cardiovascular;  Laterality: N/A;  . PSEUDOANERYSM COMPRESSION  11/11/2013   Procedure: PSEUDOANERYSM COMPRESSION;  Surgeon: Pricilla Riffle, MD;  Location: [facility] CATH LAB;  Service: Cardiovascular;;  . TEE WITHOUT CARDIOVERSION N/A 10/17/2013   Procedure: TRANSESOPHAGEAL ECHOCARDIOGRAM (TEE);  Surgeon: Thurmon Fair, MD;  Location: [facility] ENDOSCOPY;  Service: Cardiovascular;  Laterality: N/A;  . TEE WITHOUT CARDIOVERSION N/A 06/12/2014   Procedure: TRANSESOPHAGEAL ECHOCARDIOGRAM (TEE);  Surgeon: Pricilla Riffle, MD;  Location: [facility] ENDOSCOPY;  Service: Cardiovascular;  Laterality: N/A;    ROS- all systems are reviewed and negatives except as per HPI above  Current Outpatient Medications  Medication Sig Dispense Refill  . ALPRAZolam (XANAX) 1 MG tablet Take 1 mg by mouth 2 (two) times daily as needed for anxiety or sleep.     . carvedilol (COREG) 6.25 MG tablet Take 1 tablet (6.25 mg total) by mouth 2 (two) times daily with a meal. 45 tablet 3  . ELIQUIS 5 MG TABS tablet TAKE 1 TABLET BY MOUTH TWICE A DAY 60 tablet 5  . gabapentin (NEURONTIN) 300 MG capsule Take 300 mg by mouth 3 (three) times daily.    Marland Kitchen HYDROcodone-acetaminophen (NORCO/VICODIN) 5-325 MG tablet Take 1 tablet by mouth every 6 (six) hours as needed for moderate pain.    Marland Kitchen losartan (COZAAR) 100 MG tablet TAKE 1 TABLET BY MOUTH EVERY DAY 90 tablet  3   No current facility-administered medications for this visit.    Physical Exam: Vitals:   05/11/20 1503  BP: 100/66  Pulse: 76  SpO2: 94%  Weight: 139 lb 6.4 oz (63.2 kg)  Height: 5\' 7"  (1.702 m)    GEN- The patient is well appearing, alert and oriented x 3 today.   Head- normocephalic, atraumatic Eyes-  Sclera clear, conjunctiva pink Ears- hearing intact Oropharynx- clear Lungs- Clear to ausculation bilaterally, normal work of breathing Heart- Regular rate and rhythm, no murmurs, rubs or gallops, PMI not  laterally displaced GI- soft, NT, ND, + BS Extremities- no clubbing, cyanosis ,trace R leg edema  Wt Readings from Last 3 Encounters:  05/11/20 139 lb 6.4 oz (63.2 kg)  11/25/19 138 lb (62.6 kg)  04/19/19 141 lb (64 kg)    EKG tracing ordered today is personally reviewed and shows sinus  Assessment and Plan:  1.  Persistent afib Well controlled post ablation off AAD therapy chads2vasc score is 5.  Continue eliquis  2. HTN Stable No change required today  3. Tachycardia mediated CM Resolved with sinus Given edema, will repeat echo  4. Venous insufficiency 2 gram sodium diet Support hose script given today  Risks, benefits and potential toxicities for medications prescribed and/or refilled reviewed with patient today.   Return to see EP PA in 6 months  06/19/19 MD, [facility] 05/11/2020 3:30 PM

## 2020-05-11 NOTE — Patient Instructions (Addendum)
Medication Instructions:  Your physician recommends that you continue on your current medications as directed. Please refer to the Current Medication list given to you today.  *If you need a refill on your cardiac medications before your next appointment, please call your pharmacy*  Lab Work: None ordered.  If you have labs (blood work) drawn today and your tests are completely normal, you will receive your results only by: Marland Kitchen MyChart Message (if you have MyChart) OR . A paper copy in the mail If you have any lab test that is abnormal or we need to change your treatment, we will call you to review the results.  Testing/Procedures: Your physician has requested that you have an echocardiogram. Echocardiography is a painless test that uses sound waves to create images of your heart. It provides your doctor with information about the size and shape of your heart and how well your heart's chambers and valves are working. This procedure takes approximately one hour. There are no restrictions for this procedure.   Follow-Up: At [facility], you and your health needs are our priority.  As part of our continuing mission to provide you with exceptional heart care, we have created designated Provider Care Teams.  These Care Teams include your primary Cardiologist (physician) and Advanced Practice Providers (APPs -  Physician Assistants and Nurse Practitioners) who all work together to provide you with the care you need, when you need it.  We recommend signing up for the patient portal called "MyChart".  Sign up information is provided on this After Visit Summary.  MyChart is used to connect with patients for Virtual Visits (Telemedicine).  Patients are able to view lab/test results, encounter notes, upcoming appointments, etc.  Non-urgent messages can be sent to your provider as well.   To learn more about what you can do with MyChart, go to [email or web address].    Your next appointment:   Your  physician wants you to follow-up in: 6 month with Renee U. 1 year with Dr. Johney Frame. You will receive a reminder letter in the mail two months in advance. If you don't receive a letter, please call our office to schedule the follow-up appointment.   Other Instructions:   DASH Eating Plan DASH stands for "Dietary Approaches to Stop Hypertension." The DASH eating plan is a healthy eating plan that has been shown to reduce high blood pressure (hypertension). It may also reduce your risk for type 2 diabetes, heart disease, and stroke. The DASH eating plan may also help with weight loss. What are tips for following this plan?  General guidelines  Avoid eating more than 2,300 mg (milligrams) of salt (sodium) a day. If you have hypertension, you may need to reduce your sodium intake to 1,500 mg a day.  Limit alcohol intake to no more than 1 drink a day for nonpregnant women and 2 drinks a day for men. One drink equals 12 oz of beer, 5 oz of wine, or 1 oz of hard liquor.  Work with your health care provider to maintain a healthy body weight or to lose weight. Ask what an ideal weight is for you.  Get at least 30 minutes of exercise that causes your heart to beat faster (aerobic exercise) most days of the week. Activities may include walking, swimming, or biking.  Work with your health care provider or diet and nutrition specialist (dietitian) to adjust your eating plan to your individual calorie needs. Reading food labels   Check food labels for the amount of  sodium per serving. Choose foods with less than 5 percent of the Daily Value of sodium. Generally, foods with less than 300 mg of sodium per serving fit into this eating plan.  To find whole grains, look for the word "whole" as the first word in the ingredient list. Shopping  Buy products labeled as "low-sodium" or "no salt added."  Buy fresh foods. Avoid canned foods and premade or frozen meals. Cooking  Avoid adding salt when cooking.  Use salt-free seasonings or herbs instead of table salt or sea salt. Check with your health care provider or pharmacist before using salt substitutes.  Do not fry foods. Cook foods using healthy methods such as baking, boiling, grilling, and broiling instead.  Cook with heart-healthy oils, such as olive, canola, soybean, or sunflower oil. Meal planning  Eat a balanced diet that includes: ? 5 or more servings of fruits and vegetables each day. At each meal, try to fill half of your plate with fruits and vegetables. ? Up to 6-8 servings of whole grains each day. ? Less than 6 oz of lean meat, poultry, or fish each day. A 3-oz serving of meat is about the same size as a deck of cards. One egg equals 1 oz. ? 2 servings of low-fat dairy each day. ? A serving of nuts, seeds, or beans 5 times each week. ? Heart-healthy fats. Healthy fats called Omega-3 fatty acids are found in foods such as flaxseeds and coldwater fish, like sardines, salmon, and mackerel.  Limit how much you eat of the following: ? Canned or prepackaged foods. ? Food that is high in trans fat, such as fried foods. ? Food that is high in saturated fat, such as fatty meat. ? Sweets, desserts, sugary drinks, and other foods with added sugar. ? Full-fat dairy products.  Do not salt foods before eating.  Try to eat at least 2 vegetarian meals each week.  Eat more home-cooked food and less restaurant, buffet, and fast food.  When eating at a restaurant, ask that your food be prepared with less salt or no salt, if possible. What foods are recommended? The items listed may not be a complete list. Talk with your dietitian about what dietary choices are best for you. Grains Whole-grain or whole-wheat bread. Whole-grain or whole-wheat pasta. Brown rice. Orpah Cobb. Bulgur. Whole-grain and low-sodium cereals. Pita bread. Low-fat, low-sodium crackers. Whole-wheat flour tortillas. Vegetables Fresh or frozen vegetables (raw,  steamed, roasted, or grilled). Low-sodium or reduced-sodium tomato and vegetable juice. Low-sodium or reduced-sodium tomato sauce and tomato paste. Low-sodium or reduced-sodium canned vegetables. Fruits All fresh, dried, or frozen fruit. Canned fruit in natural juice (without added sugar). Meat and other protein foods Skinless chicken or [place]. Ground chicken or [place]. Pork with fat trimmed off. Fish and seafood. Egg whites. Dried beans, peas, or lentils. Unsalted nuts, nut butters, and seeds. Unsalted canned beans. Lean cuts of beef with fat trimmed off. Low-sodium, lean deli meat. Dairy Low-fat (1%) or fat-free (skim) milk. Fat-free, low-fat, or reduced-fat cheeses. Nonfat, low-sodium ricotta or cottage cheese. Low-fat or nonfat yogurt. Low-fat, low-sodium cheese. Fats and oils Soft margarine without trans fats. Vegetable oil. Low-fat, reduced-fat, or light mayonnaise and salad dressings (reduced-sodium). Canola, safflower, olive, soybean, and sunflower oils. Avocado. Seasoning and other foods Herbs. Spices. Seasoning mixes without salt. Unsalted popcorn and pretzels. Fat-free sweets. What foods are not recommended? The items listed may not be a complete list. Talk with your dietitian about what dietary choices are best for you.  Grains Baked goods made with fat, such as croissants, muffins, or some breads. Dry pasta or rice meal packs. Vegetables Creamed or fried vegetables. Vegetables in a cheese sauce. Regular canned vegetables (not low-sodium or reduced-sodium). Regular canned tomato sauce and paste (not low-sodium or reduced-sodium). Regular tomato and vegetable juice (not low-sodium or reduced-sodium). Rosita Fire. Olives. Fruits Canned fruit in a light or heavy syrup. Fried fruit. Fruit in cream or butter sauce. Meat and other protein foods Fatty cuts of meat. Ribs. Fried meat. Tomasa Blase. Sausage. Bologna and other processed lunch meats. Salami. Fatback. Hotdogs. Bratwurst. Salted nuts and  seeds. Canned beans with added salt. Canned or smoked fish. Whole eggs or egg yolks. Chicken or [place] with skin. Dairy Whole or 2% milk, cream, and half-and-half. Whole or full-fat cream cheese. Whole-fat or sweetened yogurt. Full-fat cheese. Nondairy creamers. Whipped toppings. Processed cheese and cheese spreads. Fats and oils Butter. Stick margarine. Lard. Shortening. Ghee. Bacon fat. Tropical oils, such as coconut, palm kernel, or palm oil. Seasoning and other foods Salted popcorn and pretzels. Onion salt, garlic salt, seasoned salt, table salt, and sea salt. Worcestershire sauce. Tartar sauce. Barbecue sauce. Teriyaki sauce. Soy sauce, including reduced-sodium. Steak sauce. Canned and packaged gravies. Fish sauce. Oyster sauce. Cocktail sauce. Horseradish that you find on the shelf. Ketchup. Mustard. Meat flavorings and tenderizers. Bouillon cubes. Hot sauce and Tabasco sauce. Premade or packaged marinades. Premade or packaged taco seasonings. Relishes. Regular salad dressings. Where to find more information:  National Heart, Lung, and Blood Institute: PopSteam.is  American Heart Association: www.heart.org Summary  The DASH eating plan is a healthy eating plan that has been shown to reduce high blood pressure (hypertension). It may also reduce your risk for type 2 diabetes, heart disease, and stroke.  With the DASH eating plan, you should limit salt (sodium) intake to 2,300 mg a day. If you have hypertension, you may need to reduce your sodium intake to 1,500 mg a day.  When on the DASH eating plan, aim to eat more fresh fruits and vegetables, whole grains, lean proteins, low-fat dairy, and heart-healthy fats.  Work with your health care provider or diet and nutrition specialist (dietitian) to adjust your eating plan to your individual calorie needs. This information is not intended to replace advice given to you by your health care provider. Make sure you discuss any questions you  have with your health care provider. Document Revised: 08/14/2017 Document Reviewed: 08/25/2016 Elsevier Patient Education  2020 [employer].

## 2020-05-25 ENCOUNTER — Other Ambulatory Visit: Payer: Self-pay

## 2020-05-25 ENCOUNTER — Ambulatory Visit (HOSPITAL_COMMUNITY): Payer: Medicare HMO | Attending: Cardiovascular Disease

## 2020-05-25 DIAGNOSIS — I4819 Other persistent atrial fibrillation: Secondary | ICD-10-CM | POA: Insufficient documentation

## 2020-05-25 DIAGNOSIS — I10 Essential (primary) hypertension: Secondary | ICD-10-CM | POA: Diagnosis not present

## 2020-05-25 DIAGNOSIS — I428 Other cardiomyopathies: Secondary | ICD-10-CM

## 2020-05-25 LAB — ECHOCARDIOGRAM COMPLETE: S' Lateral: 3.1 cm

## 2020-10-18 ENCOUNTER — Telehealth: Payer: Self-pay | Admitting: Internal Medicine

## 2020-10-18 NOTE — Telephone Encounter (Signed)
Patient c/o Palpitations:  High priority if patient c/o lightheadedness, shortness of breath, or chest pain  1) How long have you had palpitations/irregular HR/ Afib? Are you having the symptoms now? Been having Afib - saw his primary doctor today, told him to see [facility] Cardiologist   2) Are you currently experiencing lightheadedness, SOB or CP? Some Chest Pain, dizziness and shornof both off and on- not now   3) Do you have a history of afib (atrial fibrillation) or irregular heart rhythm? 4)  history of Afib  Have you checked your BP or HR? (document readings if available): no  5) Are you experiencing any other symptoms? *feels fatigued - I made an appt with Renee on 10-26-20

## 2020-10-18 NOTE — Telephone Encounter (Signed)
Left message to call back  

## 2020-10-23 NOTE — Telephone Encounter (Signed)
Left message to call back  

## 2020-10-25 NOTE — Progress Notes (Signed)
Cardiology Office Note Date:  10/25/2020  Patient ID:  Kanye, Depree Jan 29, 1951, MRN [id number] PCP:  Barbie Banner, MD  Electrophysiologist:  Dr. Johney Frame   Chief Complaint: unclear appoint reason  History of Present Illness: Peter Mclean is a 70 y.o. male with history of HTN, NICM, chronic CHF (systolic) with recovered LVEF, bradycardia requiring down-titration of meds, and persistent Afib s/p PVI ablation in 2105, smoker, had transient abn LFTs, normalized.  He mentions a hx of TIA "some years ago" treated at [place], saw Dr. Tyler Deis, neurologist.  Reports on his Eliquis at that time, no changes were made.  In review of notes, Dr. Johney Frame noted: "I have spoken with the patient's neurologist.  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  Dr Tyler Deis was not certain whether risks/ benefits favored stopping eliquis or not.  Chads2vasc score is at least 4.  I had a long discussion with the patient about options.  I did offer ILR with monitoring for further afib off of anticoagulation as an option.  The patient states that he is currently doing well and favors continuing anticoagulation with eliquis though he does understand risks of bleeding."  PMSxHx reports loop implant though the patient states he never decided to pursue the implant.  Unfortunately I am unable to remove it, (our staff is working on getting this corrected)  He saw Dr. Johney Frame in Jan 2019.  At that time doing well, maintaining SR, off AAD.  The patient was confused on his meds, this was addressed, his amlodipine stopped and losartan resumed at 100mg  daily.  Planned for APP visit q 47mo.  I saw him Oct 2019, physically was doing well.  His wife of 40 years had recently passed away he continued to struggle with this, missing her very much.  He denied any kind of cardiac awareness, no CP, palpations.  No SOB or DOE, no  dizziness, near syncope or syncope.  No neuro symptoms.  No bleeding or signs of bleeding, he reports compliance with his medicines.  I saw him again Aug 2020, he was doing well.  Had lost some weight.  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  He felt well, denied any cardiac awareness, no CP, palpitations, no exertional intolerances, no SOB or DOE.  He mentioned when at work crouched down working on a machine, occassionally when he stands up he is very lightheaded and takes a few seconds to recover.  He had not fainted, but once nearly did.  He denied near syncope otherwise.  He had not feelt like he had any AF. He was doing well with his Eliquis, reported compliance, no bleeding or signs of bleeding He was seeing his PMD Q 6mo His BP was 100/68 and given orthostatic symptoms and near syncope, normal LVEF by his last echo (2016) his HCTZ was stopped and encouraged to adequately hydrate.  I saw him 11/25/2019 His appointment scheduled today with reports from his daughter that he has been having CP, SOB and dizziness. The patient absolutely denies this to me.  He says the dizziness that he discussed with me last visit, off the HCTZ got much better, though he will occasionally still after being stooped down upon standing fleetingly feel lightheaded, not nearly like it was before.  He denies any near syncope or syncope. He absolutely denies  any CP to me, tells me if he was he would tell me and doesn't know why his daughter said that. He denies any rest SON, no symptoms or PND or orthopnea. Says he walks all day at work without DOE, no SOB with his ADLs.  I asked what level of exertion does it require for him to feel SOB or the need to catch his breath and he gives a description of walking around hi cul-de-sac that requires him to walk up two "pretty good inclines" and will be breathing heavy at the end. He says this is not new or different. Admits he  can not do what he could some years ago, but nothing new of late and says he feels good. He admits his knees will bother him. What he does admit that he is having trouble with is poor appetite and intake.  Says he just doesn't ever really feel hungry.  He has discussed this with his PMD and is drinking an Ensure with his AM pills and mentions that they discussed perhaps appetite stimulants He would like to gain weight He denies any bleeding or signs of bleeding  Symptoms felt to be orthostatic and 2/2 poor hydration, encouraged to do better with water intake  He saw Dr. Allred Aug 2021, doing well,, no symptoms of AF.  Noted some edema, venous insufficiency, recommended support stockings, low NA diet and planned to update his  Recommended 53mAnJoh7691.4ConservatKentuckyi8Marland Kitchen1CaMidatlantic Eye CenterreGalEl Paso Children'S Hospitala [facility] C61moAnJoh5691.4ConservatKentuckyi5Marland Kitchen7CaSouth Perry Endoscopy PLLCreGalLiberty Ambulatory Surgery Center LLCa [facility] Rehabilitation47mA9391.4ConservaMarland KitchentCaHughes Spalding Children'S HospitalEffingham Surgical Partners LLC28moAnJoh4391.4ConservatKentuckyi2Marland Kitchen2CaSurgery Center Of LynchburgreGalSt Luke'S Hospitala [facility] Medic41moAnJoh91.4ConservatKentuckyi6Marland Kitchen2CaAscension Se Wisconsin Hospital - Franklin CampusreGalTexas Neurorehab Centera Bay Microsurg41moAnJoh5191.4ConservatKentuckyi6Marland Kitchen1CaSpecialists In Urology Surgery Center LLCreGalChildrens Healthcare Of Atlanta At Scottish Ritea Delaware Surgery C44moAnJoh2191.4ConservatKentuckyi(4Marland Kitchen4CaArlington Day SurgeryreGalSheppard Pratt At Ellicott Citya Wise Health Surgecal16moAnJoh7691.4ConservatKentuckyi(56Marland Kitchen4CaSwisher Memorial HospitalreGalSurgery Center Of Rome LPa Ascension Borg[facility] Memorial46moAnJoh(8291.4ConservatKentuckyi(9Marland Kitchen0CaHospital District 1 Of Rice CountyreGalVibra Of Southeastern Michigana K[facility]ney Ambulatory Surgical Center LLC Dba Heartland Surge60moAnJoh4191.4ConservatKentuckyi(4Marland Kitchen7CaLake Murray Endoscopy CenterreGalCuyuna Regional Medical Centera Lifestream Behavior76moAnJoh(7091.4ConservatKentuckyi5Marland Kitchen7CaDecatur Morgan WestreGalMedical Center Of Aurora, Thea [place] Endosco78moAnJoh9091.4ConservatKentuckyi(3Marland Kitchen4CaAmery Hospital And ClinicreGalBaylor Scott & White Emergency Hospital Grand Prairiea [facility] Surgical C52moAnJoh2091.4ConservatKentuckyMarland KitcheniCaBob Bodi Memorial Grant County HospitalreGalHouston Va Medical Centera Gulfport Behavioral Heal30moAnJoh9391.4ConservatKentuckyi5Marland Kitchen7CaEphraim Mcdowell James B. Haggin Memorial HospitalreGalPorter Regional Hospitala Charlton Memorial48moAnJoh(3191.4ConservatKentuckyi(93Marland Kitchen8CaPresence Chicago Hospitals Network Dba Presence Saint Francis HospitalreGalLincoln Hospitala [place] H62moAnJoh(5691.4ConservatKentuckyi8Marland Kitchen5CaBrooke Glen Behavioral HospitalreGalSurgery Center Of Scottsdale LLC Dba Mountain View Surgery Center Of Scottsdalea [facility] - Sou18moAnJoh7091.4ConservatKentuckyi2Marland Kitchen6CaBrown Memorial Convalescent CenterreGalNew Hanover Regional Medical Centera Pleasant Valley64m91.84moAnJoh6291.4ConservatKentuckyMarland KitcheniCaTerrebonne General Medical CenterreGalSt. Vincent'S Easta [facility] 2moAnJoh6091.4ConservatKentuckyi5Marland Kitchen6CaAlexandria Va Health Care SystemreGalSpecialists One Day Surgery LLC Dba Specialists One Day Surgerya Glen Endoscopy C37moAnJoh5491.4ConservatKentuckyi2Marland Kitchen0CaRichardson Medical CenterreGalElite Medical Centera St. [facility] Siena Medic55moAnJoh6591.4ConservatKentuckyi6Marland Kitchen6CaMercy Hospital Logan CountyreGalMccullough-Hyde Memorial Hospitala St. Luke'S [facility]26moAnJoh9191.4ConservatKentuckyi(21Marland Kitchen2CaCharlston Area Medical CenterreGalNew England Eye Surgical Center Inca [facility] Ca49moAnJoh7291.4ConservatKentuckyMarland KitcheniCaTacoma General HospitalreGalAustin Gi Surgicenter LLC Dba Austin Gi Surgicenter Iia First Coast Orthopedic C39moAnJoh7091.4ConservatKentuckyi(6Marland Kitchen0CaPavilion Surgicenter LLC Dba Physicians Pavilion Surgery CenterreGalRiverbridge Specialty Hospitala New Han[facility] Orthopedic27moAnJoh91.4ConservatKentuckyMarland KitcheniCaWhite Mountain Regional Medical CenterreGalWakemeda Citizens Medical Centercks, historic [email or web address] ok, he was in AF during the study, 115bpm  10/18/20 saw PMD, vitals reported HR 80 Telephone note reports he called and was recommended by his PMD to follow up with his cardiologist, ? Perhaps reports of being fatigued?  TODAY The patient says he thought today's visit was a routine check in. He did see his PMD last week and mentioned to him his HR had a skip, but did not get the impression he was concerned about it. He feels quite well. 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 No CP. He denies any cardiac awareness, no palpitations of any kind. No near syncope or syncope. Rarely with banding to standing he will get fleetingly lightheaded. No bleeding or signs of bleeding.  His really only physical complaint is intermittently his L knee swells and is painful, nows he has arthritis in both his knees  AF history: 06/13/14, EPS, ablation (PVI, RA/SVC junction, and CTI)  AAD Hx Amiodarone stopped Oct 2015,  notes indicate abn LFTs, jaundice, liver failure, ? gallstone   Past Medical History:  Diagnosis Date  . Anxiety   . Arthritis   . Chronic systolic CHF (congestive heart failure) (HCC)    a. Dx 09/2013 in setting of AF-RVR; EF 10-15%, possibly tachy-mediated. Cath 09/2013: normal cors. b. TEE: EF 20-25% by echo 10/2013.  . Dyslipidemia (high LDL; low HDL)   . Elevated liver enzymes   . H/O hematuria   . History of inguinal hernia   . Hypertension   . NICM (nonischemic cardiomyopathy) (HCC)   . Non-compliance    a. Adm 09/2013 after having stopped his meds.  . Paroxysmal atrial fibrillation (HCC)   . Paroxysmal atrial flutter (HCC)    typical appearing  . Pseudoaneurysm following procedure (HCC)  a. RLE pseudoaneurysm after cath, spontaneously decreased in size, did not require compression.  . Sinus bradycardia    a. HR 40's in ER 11/2013 - digoxin stopped, Coreg decreased.  . Tobacco abuse     Past Surgical History:  Procedure Laterality Date  . ABLATION  06-13-14   PVI and CTI by Dr Johney Frame  . ATRIAL FIBRILLATION ABLATION N/A 06/13/2014   Procedure: ATRIAL FIBRILLATION ABLATION;  Surgeon: Gardiner Rhyme, MD;  Location: [facility] CATH LAB;  Service: Cardiovascular;  Laterality: N/A;  . CARDIOVERSION N/A 10/17/2013   Procedure: CARDIOVERSION;  Surgeon: Thurmon Fair, MD;  Location: [facility] ENDOSCOPY;  Service: Cardiovascular;  Laterality: N/A;  . CHOLECYSTECTOMY    . HERNIA REPAIR  1992 & 2011  . LEFT AND RIGHT HEART CATHETERIZATION WITH CORONARY ANGIOGRAM N/A 10/13/2013   Procedure: LEFT AND RIGHT HEART CATHETERIZATION WITH CORONARY ANGIOGRAM;  Surgeon: Marykay Lex, MD;  Location: [facility] CATH LAB;  Service: Cardiovascular;  Laterality: N/A;  . PSEUDOANERYSM COMPRESSION  11/11/2013   Procedure: PSEUDOANERYSM COMPRESSION;  Surgeon: Pricilla Riffle, MD;  [facility] CATH LAB;  Service: Cardiovascular;;  . TEE WITHOUT CARDIOVERSION N/A 10/17/2013   Procedure: TRANSESOPHAGEAL ECHOCARDIOGRAM (TEE);   Surgeon: Thurmon Fair, MD;  Location: [facility] ENDOSCOPY;  Service: Cardiovascular;  Laterality: N/A;  . TEE WITHOUT CARDIOVERSION N/A 06/12/2014   Procedure: TRANSESOPHAGEAL ECHOCARDIOGRAM (TEE);  Surgeon: Pricilla Riffle, MD;  Location: [facility] ENDOSCOPY;  Service: Cardiovascular;  Laterality: N/A;    Current Outpatient Medications  Medication Sig Dispense Refill  . ALPRAZolam (XANAX) 1 MG tablet Take 1 mg by mouth 2 (two) times daily as needed for anxiety or sleep.     . carvedilol (COREG) 6.25 MG tablet Take 1 tablet (6.25 mg total) by mouth 2 (two) times daily with a meal. 45 tablet 3  . ELIQUIS 5 MG TABS tablet TAKE 1 TABLET BY MOUTH TWICE A DAY 60 tablet 5  . gabapentin (NEURONTIN) 300 MG capsule Take 300 mg by mouth 3 (three) times daily.    Marland Kitchen HYDROcodone-acetaminophen (NORCO/VICODIN) 5-325 MG tablet Take 1 tablet by mouth every 6 (six) hours as needed for moderate pain.    Marland Kitchen losartan (COZAAR) 100 MG tablet TAKE 1 TABLET BY MOUTH EVERY DAY 90 tablet 3   No current facility-administered medications for this visit.    Allergies:   Tramadol   Social History:  The patient  reports that he has been smoking. He has a 20.00 pack-year smoking history. He has never used smokeless tobacco. He reports current alcohol use.   Family History:  The patient's family history includes Heart attack in his father; Hyperlipidemia in his mother; Hypertension in his mother.  ROS:  Please see the history of present illness.  All other systems are reviewed and otherwise negative.   PHYSICAL EXAM: VS:  There were no vitals taken for this visit. BMI: There is no height or weight on file to calculate BMI. Well nourished, well developed, in no acute distress  HEENT: normocephalic, atraumatic  Neck: no JVD, carotid bruits or masses Cardiac:  irreg-irreg; no significant murmurs, no rubs, or gallops Lungs: CTA b/l, no wheezing, rhonchi or rales  Abd: soft, nontender MS: no deformity or atrophy, though has thin body  habitus Ext: no edema  Skin: warm and dry, no rash Neuro:  No gross deficits appreciated Psych: euthymic mood, full affect   EKG:  Done today and reviewed by myself: coarse AFib, 104bpm  05/25/20: TTE IMPRESSIONS  1. Left ventricular ejection fraction, by  estimation, is 55 to 60%. The  left ventricle has normal function. The left ventricle has no regional  wall motion abnormalities. Left ventricular diastolic function could not  be evaluated.  2. Right ventricular systolic function is normal. The right ventricular  size is normal.  3. The mitral valve is normal in structure. Trivial mitral valve  regurgitation. No evidence of mitral stenosis.  4. The aortic valve is normal in structure. Aortic valve regurgitation is  not visualized. No aortic stenosis is present.  5. The inferior vena cava is normal in size with greater than 50%  respiratory variability, suggesting right atrial pressure of 3 mmHg.   Comparison(s): No prior Echocardiogram. Atrial fibrillation with average  ventricular rate 115 bpm at rest during the study.    05/01/15: TTE Study Conclusions - Left ventricle: The cavity size was normal. Wall thickness was   normal. Systolic function was normal. Wall motion was normal;   there were no regional wall motion abnormalities. - Left atrium: The atrium was mildly dilated. - Right ventricle: The cavity size was normal. Wall thickness was   normal. Systolic function was normal. - Tricuspid valve: There was trivial regurgitation. - Pulmonary arteries: PA peak pressure: 21 mm Hg (S).   06/13/14:EPS/ablation CONCLUSIONS: 1. Sinus rhythm upon presentation.   2. 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  There was a right middle pulmonary vein which was isolated with the right inferior pulmonary vein.  The right superior pulmonary vein appeared to be the culprit vein and  afib was terminated with isolation of this vein.  Additional right atrial ablation was performed at the junction of the SVC and RA. 3. Successful electrical isolation and anatomical encircling of all four pulmonary veins with radiofrequency current.    4. Cavo-tricuspid isthmus ablation was performed with complete bidirectional isthmus block achieved.  5. No inducible arrhythmias following ablation both on and off of dobutamine 6. No early apparent complications.   Recent Labs: No results found for requested labs within last 8760 hours.  No results found for requested labs within last 8760 hours.   CrCl cannot be calculated (Patient's most recent lab result is older than the maximum 21 days allowed.).   Wt Readings from Last 3 Encounters:  05/11/20 139 lb 6.4 oz (63.2 kg)  11/25/19 138 lb (62.6 kg)  04/19/19 141 lb (64 kg)     Other studies reviewed: Additional studies/records reviewed today include: summarized above  ASSESSMENT AND PLAN:  1. Persistant AFib     CHA2DS2Vasc is 5, on Eliquis, appropriately dosed       He is unaware of his AFib Was in SR Aug 2021, AFib noted in Sept when he had his echo. Uncertain if he has been in/out or in Afib since Sept, he is asymptomatic We discussed management strategies, recommend that we start with DCCV and follow up Discussed AAD medicines or touch up ablation all as options. He is not interested in rhythm control. Is certain that he feels well and would like to leave things as they are Discussed that if we DCCV he may find that he feels better, but again, not interested in that.  His HR 115 with his echo, today 104, I recommend he wear a monitor for a couple days to establish how his rate control is.  Labs today   2. NICM     2016, normalized LVEF     On BB/ARB, no exam  findings or symptoms to suggest fluid OL           3. HTN     No changes   Disposition: see him back in 20mo, sooner if needed     Current medicines are  reviewed at length with the patient today.  The patient did not have any concerns regarding medicines.  Norma Fredrickson, PA-C 10/25/2020 7:18 AM     [facility] [address] [place] [address] [phone] (office)  [phone] (fax)

## 2020-10-26 ENCOUNTER — Ambulatory Visit (INDEPENDENT_AMBULATORY_CARE_PROVIDER_SITE_OTHER): Payer: Medicare HMO

## 2020-10-26 ENCOUNTER — Ambulatory Visit: Payer: Medicare HMO | Admitting: Physician Assistant

## 2020-10-26 ENCOUNTER — Other Ambulatory Visit: Payer: Self-pay

## 2020-10-26 ENCOUNTER — Encounter: Payer: Self-pay | Admitting: Physician Assistant

## 2020-10-26 VITALS — BP 122/96 | HR 104 | Ht 67.0 in | Wt 134.8 lb

## 2020-10-26 DIAGNOSIS — I4819 Other persistent atrial fibrillation: Secondary | ICD-10-CM | POA: Diagnosis not present

## 2020-10-26 DIAGNOSIS — I10 Essential (primary) hypertension: Secondary | ICD-10-CM

## 2020-10-26 DIAGNOSIS — I428 Other cardiomyopathies: Secondary | ICD-10-CM | POA: Diagnosis not present

## 2020-10-26 LAB — CBC
Hematocrit: 46.8 % (ref 37.5–51.0)
Hemoglobin: 16.2 g/dL (ref 13.0–17.7)
MCH: 30.7 pg (ref 26.6–33.0)
MCHC: 34.6 g/dL (ref 31.5–35.7)
MCV: 89 fL (ref 79–97)
Platelets: 220 10*3/uL (ref 150–450)
RBC: 5.28 x10E6/uL (ref 4.14–5.80)
RDW: 12.9 % (ref 11.6–15.4)
WBC: 8.8 10*3/uL (ref 3.4–10.8)

## 2020-10-26 LAB — BASIC METABOLIC PANEL
BUN/Creatinine Ratio: 17 (ref 10–24)
BUN: 17 mg/dL (ref 8–27)
CO2: 25 mmol/L (ref 20–29)
Calcium: 9.5 mg/dL (ref 8.6–10.2)
Chloride: 107 mmol/L — ABNORMAL HIGH (ref 96–106)
Creatinine, Ser: 0.99 mg/dL (ref 0.76–1.27)
GFR calc Af Amer: 89 mL/min/{1.73_m2} (ref >59)
GFR calc non Af Amer: 77 mL/min/{1.73_m2} (ref >59)
Glucose: 112 mg/dL — ABNORMAL HIGH (ref 65–99)
Potassium: 4.8 mmol/L (ref 3.5–5.2)
Sodium: 144 mmol/L (ref 134–144)

## 2020-10-26 NOTE — Patient Instructions (Addendum)
Medication Instructions:   Your physician recommends that you continue on your current medications as directed. Please refer to the Current Medication list given to you today.  *If you need a refill on your cardiac medications before your next appointment, please call your pharmacy*   Lab Work:  BMET AND CBC TODAY   If you have labs (blood work) drawn today and your tests are completely normal, you will receive your results only by: Marland Kitchen MyChart Message (if you have MyChart) OR . A paper copy in the mail If you have any lab test that is abnormal or we need to change your treatment, we will call you to review the results.   Testing/Procedures: Your physician has recommended that you wear an event monitor. Event monitors are medical devices that record the heart's electrical activity. Doctors most often [place] these monitors to diagnose arrhythmias. Arrhythmias are problems with the speed or rhythm of the heartbeat. The monitor is a small, portable device. You can wear one while you do your normal daily activities. This is usually used to diagnose what is causing palpitations/syncope (passing out).   Follow-Up: At [facility], you and your health needs are our priority.  As part of our continuing mission to provide you with exceptional heart care, we have created designated Provider Care Teams.  These Care Teams include your primary Cardiologist (physician) and Advanced Practice Providers (APPs -  Physician Assistants and Nurse Practitioners) who all work together to provide you with the care you need, when you need it.  We recommend signing up for the patient portal called "MyChart".  Sign up information is provided on this After Visit Summary.  MyChart is used to connect with patients for Virtual Visits (Telemedicine).  Patients are able to view lab/test results, encounter notes, upcoming appointments, etc.  Non-urgent messages can be sent to your provider as well.   To learn more about what you  can do with MyChart, go to [email or web address].    Your next appointment:   6 month(s)  The format for your next appointment:   In Person  Provider:   You may see Dr. Johney Frame or one of the following Advanced Practice Providers on your designated Care Team:   Francis Dowse, PA-C     Other Instructions  ZIO XT- Long Term Monitor Instructions   Your physician has requested you wear your ZIO patch monitor__3_____days.   This is a single patch monitor.  [employer] supplies one patch monitor per enrollment.  Additional stickers are not available.   Please do not apply patch if you will be having a Nuclear Stress Test, Echocardiogram, Cardiac CT, MRI, or Chest Xray during the time frame you would be wearing the monitor. The patch cannot be worn during these tests.  You cannot remove and re-apply the ZIO XT patch monitor.   Your ZIO patch monitor will be sent USPS Priority mail from [facility] directly to your home address. The monitor may also be mailed to a PO BOX if home delivery is not available.   It may take 3-5 days to receive your monitor after you have been enrolled.   Once you have received you monitor, please review enclosed instructions.  Your monitor has already been registered assigning a specific monitor serial # to you.   Applying the monitor   Shave hair from upper left chest.   Hold abrader disc by orange tab.  Rub abrader in 40 strokes over left upper chest as indicated in your monitor instructions.  Clean area with 4 enclosed alcohol pads .  Use all pads to assure are is cleaned thoroughly.  Let dry.   Apply patch as indicated in monitor instructions.  Patch will be place under collarbone on left side of chest with arrow pointing upward.   Rub patch adhesive wings for 2 minutes.Remove white label marked "1".  Remove white label marked "2".  Rub patch adhesive wings for 2 additional minutes.   While looking in a mirror, press and release button in center  of patch.  A small green light will flash 3-4 times .  This will be your only indicator the monitor has been turned on.     Do not shower for the first 24 hours.  You may shower after the first 24 hours.   Press button if you feel a symptom. You will hear a small click.  Record Date, Time and Symptom in the Patient Log Book.   When you are ready to remove patch, follow instructions on last 2 pages of Patient Log Book.  Stick patch monitor onto last page of Patient Log Book.   Place Patient Log Book in [place] box.  Use locking tab on box and tape box closed securely.  The [employer] and [employer] has [employer] on it.  Please place in mailbox as soon as possible.  Your physician should have your test results approximately 7 days after the monitor has been mailed back to [facility].   Call [facility] Customer Care at [phone] if you have questions regarding your ZIO XT patch monitor.  Call them immediately if you see an orange light blinking on your monitor.   If your monitor falls off in less than 4 days contact our Monitor department at [phone].  If your monitor becomes loose or falls off after 4 days call [employer] at [phone] for suggestions on securing your monitor.

## 2020-10-29 NOTE — Telephone Encounter (Signed)
Had appointment with Francis Dowse 10/26/20.

## 2020-11-03 DIAGNOSIS — I4819 Other persistent atrial fibrillation: Secondary | ICD-10-CM

## 2020-11-27 ENCOUNTER — Other Ambulatory Visit: Payer: Self-pay

## 2020-11-27 ENCOUNTER — Inpatient Hospital Stay (HOSPITAL_COMMUNITY)
Admission: EM | Admit: 2020-11-27 | Discharge: 2020-11-30 | DRG: 246 | Disposition: A | Discharge status: Home / Self Care | Payer: Medicare HMO | Attending: Internal Medicine | Admitting: Internal Medicine

## 2020-11-27 ENCOUNTER — Encounter (HOSPITAL_COMMUNITY): Payer: Self-pay | Admitting: Emergency Medicine

## 2020-11-27 ENCOUNTER — Emergency Department (HOSPITAL_COMMUNITY): Payer: Medicare HMO

## 2020-11-27 DIAGNOSIS — I214 Non-ST elevation (NSTEMI) myocardial infarction: Principal | ICD-10-CM | POA: Diagnosis present

## 2020-11-27 DIAGNOSIS — Z720 Tobacco use: Secondary | ICD-10-CM | POA: Diagnosis present

## 2020-11-27 DIAGNOSIS — I2510 Atherosclerotic heart disease of native coronary artery without angina pectoris: Secondary | ICD-10-CM | POA: Diagnosis not present

## 2020-11-27 DIAGNOSIS — E785 Hyperlipidemia, unspecified: Secondary | ICD-10-CM | POA: Diagnosis present

## 2020-11-27 DIAGNOSIS — I4892 Unspecified atrial flutter: Secondary | ICD-10-CM | POA: Diagnosis present

## 2020-11-27 DIAGNOSIS — I4891 Unspecified atrial fibrillation: Secondary | ICD-10-CM | POA: Diagnosis not present

## 2020-11-27 DIAGNOSIS — Z83438 Family history of other disorder of lipoprotein metabolism and other lipidemia: Secondary | ICD-10-CM

## 2020-11-27 DIAGNOSIS — I428 Other cardiomyopathies: Secondary | ICD-10-CM | POA: Diagnosis present

## 2020-11-27 DIAGNOSIS — I5023 Acute on chronic systolic (congestive) heart failure: Secondary | ICD-10-CM | POA: Diagnosis present

## 2020-11-27 DIAGNOSIS — Z7901 Long term (current) use of anticoagulants: Secondary | ICD-10-CM

## 2020-11-27 DIAGNOSIS — Z8249 Family history of ischemic heart disease and other diseases of the circulatory system: Secondary | ICD-10-CM

## 2020-11-27 DIAGNOSIS — Z8673 Personal history of transient ischemic attack (TIA), and cerebral infarction without residual deficits: Secondary | ICD-10-CM

## 2020-11-27 DIAGNOSIS — Z20822 Contact with and (suspected) exposure to covid-19: Secondary | ICD-10-CM | POA: Diagnosis present

## 2020-11-27 DIAGNOSIS — I4819 Other persistent atrial fibrillation: Secondary | ICD-10-CM | POA: Diagnosis present

## 2020-11-27 DIAGNOSIS — Z885 Allergy status to narcotic agent status: Secondary | ICD-10-CM

## 2020-11-27 DIAGNOSIS — I110 Hypertensive heart disease with heart failure: Secondary | ICD-10-CM | POA: Diagnosis present

## 2020-11-27 DIAGNOSIS — R778 Other specified abnormalities of plasma proteins: Secondary | ICD-10-CM

## 2020-11-27 DIAGNOSIS — M1990 Unspecified osteoarthritis, unspecified site: Secondary | ICD-10-CM | POA: Diagnosis present

## 2020-11-27 DIAGNOSIS — Z79899 Other long term (current) drug therapy: Secondary | ICD-10-CM

## 2020-11-27 DIAGNOSIS — F17210 Nicotine dependence, cigarettes, uncomplicated: Secondary | ICD-10-CM | POA: Diagnosis present

## 2020-11-27 DIAGNOSIS — Z955 Presence of coronary angioplasty implant and graft: Secondary | ICD-10-CM

## 2020-11-27 DIAGNOSIS — F411 Generalized anxiety disorder: Secondary | ICD-10-CM | POA: Diagnosis present

## 2020-11-27 DIAGNOSIS — I10 Essential (primary) hypertension: Secondary | ICD-10-CM | POA: Diagnosis present

## 2020-11-27 DIAGNOSIS — I5022 Chronic systolic (congestive) heart failure: Secondary | ICD-10-CM | POA: Diagnosis present

## 2020-11-27 HISTORY — DX: Non-ST elevation (NSTEMI) myocardial infarction: I21.4

## 2020-11-27 LAB — BASIC METABOLIC PANEL
Anion gap: 7 (ref 5–15)
BUN: 16 mg/dL (ref 8–23)
CO2: 26 mmol/L (ref 22–32)
Calcium: 9 mg/dL (ref 8.9–10.3)
Chloride: 108 mmol/L (ref 98–111)
Creatinine, Ser: 1 mg/dL (ref 0.61–1.24)
GFR, Estimated: >60 mL/min (ref >60)
Glucose, Bld: 106 mg/dL — ABNORMAL HIGH (ref 70–99)
Potassium: 3.8 mmol/L (ref 3.5–5.1)
Sodium: 141 mmol/L (ref 135–145)

## 2020-11-27 LAB — CBC
HCT: 45.5 % (ref 39.0–52.0)
Hemoglobin: 14.6 g/dL (ref 13.0–17.0)
MCH: 30.2 pg (ref 26.0–34.0)
MCHC: 32.1 g/dL (ref 30.0–36.0)
MCV: 94.2 fL (ref 80.0–100.0)
Platelets: 197 10*3/uL (ref 150–400)
RBC: 4.83 MIL/uL (ref 4.22–5.81)
RDW: 13.9 % (ref 11.5–15.5)
WBC: 10.8 10*3/uL — ABNORMAL HIGH (ref 4.0–10.5)
nRBC: 0 % (ref 0.0–0.2)

## 2020-11-27 LAB — TROPONIN I (HIGH SENSITIVITY)
Troponin I (High Sensitivity): 61 ng/L — ABNORMAL HIGH (ref <18)
Troponin I (High Sensitivity): 224 ng/L (ref <18)

## 2020-11-27 LAB — MAGNESIUM: Magnesium: 1.7 mg/dL (ref 1.7–2.4)

## 2020-11-27 MED ORDER — ONDANSETRON HCL 4 MG/2ML IJ SOLN: 4.0000 mg | Freq: Four times a day (QID) | INTRAMUSCULAR | Status: DC | PRN

## 2020-11-27 MED ORDER — PROPOFOL 10 MG/ML IV BOLUS
1.0000 mg/kg | Freq: Once | INTRAVENOUS | Status: AC
  Administered 2020-11-27: 10 mg via INTRAVENOUS
  Filled 2020-11-27: qty 20

## 2020-11-27 MED ORDER — ALPRAZOLAM 0.5 MG PO TABS: 1.0000 mg | ORAL_TABLET | Freq: Two times a day (BID) | ORAL | Status: DC | PRN

## 2020-11-27 MED ORDER — HEPARIN (PORCINE) 25000 UT/250ML-% IV SOLN
1050.0000 [IU]/h | INTRAVENOUS | Status: DC
  Administered 2020-11-28: 900 [IU]/h via INTRAVENOUS
  Administered 2020-11-29: 1050 [IU]/h via INTRAVENOUS
  Filled 2020-11-27 (×2): qty 250

## 2020-11-27 MED ORDER — PROPOFOL 10 MG/ML IV BOLUS
INTRAVENOUS | Status: AC | PRN
  Administered 2020-11-27: 61.2 mg via INTRAVENOUS

## 2020-11-27 MED ORDER — ASPIRIN 81 MG PO CHEW
324.0000 mg | CHEWABLE_TABLET | Freq: Once | ORAL | Status: DC
  Filled 2020-11-27: qty 4

## 2020-11-27 MED ORDER — ACETAMINOPHEN 325 MG PO TABS: 650.0000 mg | ORAL_TABLET | ORAL | Status: DC | PRN

## 2020-11-27 MED ORDER — METOPROLOL TARTRATE 5 MG/5ML IV SOLN
5.0000 mg | Freq: Once | INTRAVENOUS | Status: AC
  Administered 2020-11-27: 5 mg via INTRAVENOUS
  Filled 2020-11-27: qty 5

## 2020-11-27 MED ORDER — GABAPENTIN 300 MG PO CAPS
300.0000 mg | ORAL_CAPSULE | Freq: Three times a day (TID) | ORAL | Status: DC
  Administered 2020-11-28 – 2020-11-29 (×7): 300 mg via ORAL
  Filled 2020-11-27 (×7): qty 1

## 2020-11-27 MED ORDER — METOPROLOL TARTRATE 25 MG PO TABS
25.0000 mg | ORAL_TABLET | Freq: Two times a day (BID) | ORAL | Status: DC
  Administered 2020-11-28 – 2020-11-29 (×5): 25 mg via ORAL
  Filled 2020-11-27 (×5): qty 1

## 2020-11-27 NOTE — ED Provider Notes (Signed)
[facility] EMERGENCY DEPARTMENT Provider Note   CSN: [id number] Arrival date & time: 11/27/20  1833     History Chief Complaint  Patient presents with  . Chest Pain    Peter Mclean is a 70 y.o. male w/ hx of HLD, NICM, parox A Fib on eliquis and coreg BID, presenting to Ed with chest discomfort.  Onset in the afternoon at work today.  Works in [employer].  Felt palpitations, heart racing, chest squeezing, intermittently throughout afternoon.  Called EMS this evening and symptoms resolved en route to hospital.  Currently pain free, feels normal.  Reports he is under lots of stress due to family difficulties at home.  His cardiologist is Dr Johney Frame.  Pt reports compliance with all meds and took his evening meds before EMS picked him up.    HPI     Past Medical History:  Diagnosis Date  . Anxiety   . Arthritis   . Chronic systolic CHF (congestive heart failure) (HCC)    a. Dx 09/2013 in setting of AF-RVR; EF 10-15%, possibly tachy-mediated. Cath 09/2013: normal cors. b. TEE: EF 20-25% by echo 10/2013.  Marland Kitchen Dyslipidemia (high LDL; low HDL)   . Elevated liver enzymes   . H/O hematuria   . History of inguinal hernia   . Hypertension   . NICM (nonischemic cardiomyopathy) (HCC)   . Non-compliance    a. Adm 09/2013 after having stopped his meds.  . Paroxysmal atrial fibrillation (HCC)   . Paroxysmal atrial flutter (HCC)    typical appearing  . Pseudoaneurysm following procedure (HCC)    a. RLE pseudoaneurysm after cath, spontaneously decreased in size, did not require compression.  . Sinus bradycardia    a. HR 40's in ER 11/2013 - digoxin stopped, Coreg decreased.  . Tobacco abuse     Patient Active Problem List   Diagnosis Date Noted  . Atrial fibrillation with rapid ventricular response (HCC) 11/27/2020  . Atrial fibrillation (HCC) 06/13/2014  . Chronic systolic CHF (congestive heart failure) (HCC) 12/13/2013  . Sinus bradycardia 12/13/2013  .  Postoperative groin pseudoaneurysm 11/17/2013  . Tobacco abuse   . NICM (nonischemic cardiomyopathy) (HCC) 10/14/2013  . Non compliance with medical treatment- stopped meds prior to adm 10/14/2013  . Dyslipidemia (high LDL; low HDL)   . Persistent atrial fibrillation (HCC) 10/12/2013  . HTN (hypertension) 10/12/2013  . Paroxysmal atrial flutter (HCC) 05/24/2009  . ANXIETY 05/23/2009  . ARTHRITIS 05/23/2009  . HEMATURIA, HX OF 05/23/2009  . INGUINAL HERNIA, HX OF 05/23/2009    Past Surgical History:  Procedure Laterality Date  . ABLATION  06-13-14   PVI and CTI by Dr Johney Frame  . ATRIAL FIBRILLATION ABLATION N/A 06/13/2014   Procedure: ATRIAL FIBRILLATION ABLATION;  Surgeon: Gardiner Rhyme, MD;  Location: [facility] CATH LAB;  Service: Cardiovascular;  Laterality: N/A;  . CARDIOVERSION N/A 10/17/2013   Procedure: CARDIOVERSION;  Surgeon: Thurmon Fair, MD;  Location: [facility] ENDOSCOPY;  Service: Cardiovascular;  Laterality: N/A;  . CHOLECYSTECTOMY    . HERNIA REPAIR  1992 & 2011  . LEFT AND RIGHT HEART CATHETERIZATION WITH CORONARY ANGIOGRAM N/A 10/13/2013   Procedure: LEFT AND RIGHT HEART CATHETERIZATION WITH CORONARY ANGIOGRAM;  Surgeon: Marykay Lex, MD;  Location: [facility] CATH LAB;  Service: Cardiovascular;  Laterality: N/A;  . PSEUDOANERYSM COMPRESSION  11/11/2013   Procedure: PSEUDOANERYSM COMPRESSION;  Surgeon: Pricilla Riffle, MD;  Location: [facility] CATH LAB;  Service: Cardiovascular;;  . TEE WITHOUT CARDIOVERSION N/A 10/17/2013   Procedure:  TRANSESOPHAGEAL ECHOCARDIOGRAM (TEE);  Surgeon: Thurmon Fair, MD;  Location: [facility] ENDOSCOPY;  Service: Cardiovascular;  Laterality: N/A;  . TEE WITHOUT CARDIOVERSION N/A 06/12/2014   Procedure: TRANSESOPHAGEAL ECHOCARDIOGRAM (TEE);  Surgeon: Pricilla Riffle, MD;  Location: [facility] ENDOSCOPY;  Service: Cardiovascular;  Laterality: N/A;       Family History  Problem Relation Age of Onset  . Hyperlipidemia Mother   . Hypertension Mother   . Heart attack Father     Social  History   Tobacco Use  . Smoking status: Current Some Day Smoker    Packs/day: 0.50    Years: 40.00    Pack years: 20.00    Last attempt to quit: 06/07/2014    Years since quitting: 6.4  . Smokeless tobacco: Never Used  . Tobacco comment: Has cut back to 2-3 cigarettes per day.  Substance Use Topics  . Alcohol use: Yes    Alcohol/week: 0.0 standard drinks    Comment: 1-2 A DAY    Home Medications Prior to Admission medications   Medication Sig Start Date End Date Taking? Authorizing Provider  albuterol (VENTOLIN HFA) 108 (90 Base) MCG/ACT inhaler 1 or 2 puffs up to 4 times a day as needed for wheezing 10/18/20   [provider]  ALPRAZolam Prudy Feeler) 1 MG tablet Take 1 mg by mouth 2 (two) times daily as needed for anxiety or sleep.    [provider]  carvedilol (COREG) 6.25 MG tablet Take 1 tablet (6.25 mg total) by mouth 2 (two) times daily with a meal. 01/03/15   Allred, Fayrene Fearing, MD  ELIQUIS 5 MG TABS tablet TAKE 1 TABLET BY MOUTH TWICE A DAY 12/01/16   Allred, Fayrene Fearing, MD  gabapentin (NEURONTIN) 300 MG capsule Take 300 mg by mouth 3 (three) times daily. 09/18/19   [provider]  HYDROcodone-acetaminophen (NORCO/VICODIN) 5-325 MG tablet Take 1 tablet by mouth every 6 (six) hours as needed for moderate pain.    [provider]  losartan (COZAAR) 100 MG tablet TAKE 1 TABLET BY MOUTH EVERY DAY 07/22/18   Newman Nip, NP    Allergies    Tramadol  Review of Systems   Review of Systems  Constitutional: Negative for chills and fever.  HENT: Negative for ear pain and sore throat.   Eyes: Negative for pain and visual disturbance.  Respiratory: Negative for cough and shortness of breath.   Cardiovascular: Positive for chest pain and palpitations. Negative for leg swelling.  Gastrointestinal: Negative for abdominal pain and vomiting.  Genitourinary: Negative for dysuria and hematuria.  Musculoskeletal: Negative for arthralgias and back pain.  Skin:  Negative for color change and rash.  Neurological: Negative for syncope, light-headedness and headaches.  All other systems reviewed and are negative.   Physical Exam Updated Vital Signs BP (!) 141/99   Pulse 76   Temp 98.7 F (37.1 C)   Resp (!) 28   Ht 5\' 7"  (1.702 m)   Wt 61.2 kg   SpO2 98%   BMI 21.14 kg/m   Physical Exam Constitutional:      General: He is not in acute distress. HENT:     Head: Normocephalic and atraumatic.  Eyes:     Conjunctiva/sclera: Conjunctivae normal.     Pupils: Pupils are equal, round, and reactive to light.  Cardiovascular:     Rate and Rhythm: Tachycardia present. Rhythm irregular.  Pulmonary:     Effort: Pulmonary effort is normal. No respiratory distress.  Abdominal:     General: There is  no distension.     Tenderness: There is no abdominal tenderness.  Skin:    General: Skin is warm and dry.  Neurological:     General: No focal deficit present.     Mental Status: He is alert. Mental status is at baseline.  Psychiatric:        Mood and Affect: Mood normal.        Behavior: Behavior normal.     ED Results / Procedures / Treatments   Labs (all labs ordered are listed, but only abnormal results are displayed) Labs Reviewed  BASIC METABOLIC PANEL - Abnormal; Notable for the following components:      Result Value   Glucose, Bld 106 (*)    All other components within normal limits  CBC - Abnormal; Notable for the following components:   WBC 10.8 (*)    All other components within normal limits  TROPONIN I (HIGH SENSITIVITY) - Abnormal; Notable for the following components:   Troponin I (High Sensitivity) 61 (*)    All other components within normal limits  TROPONIN I (HIGH SENSITIVITY) - Abnormal; Notable for the following components:   Troponin I (High Sensitivity) 224 (*)    All other components within normal limits  MAGNESIUM  TSH  HEMOGLOBIN A1C  LIPID PANEL  TROPONIN I (HIGH SENSITIVITY)    EKG EKG  Interpretation  Date/Time:  Tuesday November 27 2020 18:35:59 EDT Ventricular Rate:  112 PR Interval:    QRS Duration: 88 QT Interval:  286 QTC Calculation: 390 R Axis:   117 Text Interpretation: Atrial flutter with variable A-V block with premature ventricular or aberrantly conducted complexes Left posterior fascicular block Possible Anterior infarct , age undetermined Abnormal ECG No STEMI Confirmed by Alvester Chou [phone]) on 11/27/2020 7:27:17 PM   Radiology DG Chest 2 View  Result Date: 11/27/2020 CLINICAL DATA:  Chest pain and body aches.  Weakness. EXAM: CHEST - 2 VIEW COMPARISON:  12/13/2013. FINDINGS: Chronic hyperinflation and peribronchial thickening. Normal heart size and mediastinal contours. No acute airspace disease. No pleural effusion or pneumothorax. Normal pulmonary vasculature. No acute osseous abnormalities are seen. IMPRESSION: 1. No acute abnormality. 2. Mild chronic hyperinflation and bronchial thickening, likely smoking related. Electronically Signed   By: Narda Rutherford M.D.   On: 11/27/2020 19:16    Procedures .Critical Care Performed by: Terald Sleeper, MD Authorized by: Terald Sleeper, MD   Critical care provider statement:    Critical care time (minutes):  45   Critical care was necessary to treat or prevent imminent or life-threatening deterioration of the following conditions:  Circulatory failure   Critical care was time spent personally by me on the following activities:  Discussions with consultants, evaluation of patient's response to treatment, examination of patient, ordering and performing treatments and interventions, ordering and review of laboratory studies, ordering and review of radiographic studies, pulse oximetry, re-evaluation of patient's condition, obtaining history from patient or surrogate and review of old charts Comments:     A Fib rate control .Sedation  Date/Time: 11/28/2020 1:13 AM Performed by: Terald Sleeper,  MD Authorized by: Terald Sleeper, MD   Consent:    Consent obtained:  Verbal   Consent given by:  Patient   Risks discussed:  Allergic reaction, dysrhythmia, inadequate sedation, nausea, prolonged hypoxia resulting in organ damage, prolonged sedation necessitating reversal, respiratory compromise necessitating ventilatory assistance and intubation and vomiting   Alternatives discussed:  Analgesia without sedation, anxiolysis and regional anesthesia Universal protocol:  Procedure explained and questions answered to patient or proxy's satisfaction: yes     Relevant documents present and verified: yes     Test results available: yes     Imaging studies available: yes     Required blood products, implants, devices, and special equipment available: yes     Site/side marked: yes     Immediately prior to procedure, a time out was called: yes     Patient identity confirmed:  Verbally with patient and arm band Indications:    Procedure performed:  Cardioversion   Procedure necessitating sedation performed by:  Physician performing sedation Pre-sedation assessment:    Time since last food or drink:  5 hour   ASA classification: class 2 - patient with mild systemic disease     Mouth opening:  3 or more finger widths   Thyromental distance:  4 finger widths   Mallampati score:  I - soft palate, uvula, fauces, pillars visible   Neck mobility: normal     Pre-sedation assessments completed and reviewed: airway patency, cardiovascular function, hydration status, mental status, nausea/vomiting, pain level, respiratory function and temperature   Immediate pre-procedure details:    Reassessment: Patient reassessed immediately prior to procedure     Reviewed: vital signs, relevant labs/tests and NPO status     Verified: bag valve mask available, emergency equipment available, intubation equipment available, IV patency confirmed, oxygen available and suction available   Procedure details (see MAR for  exact dosages):    Preoxygenation:  Nasal cannula   Sedation:  Propofol   Intended level of sedation: deep   Intra-procedure monitoring:  Blood pressure monitoring, cardiac monitor, continuous pulse oximetry, frequent LOC assessments, frequent vital sign checks and continuous capnometry   Intra-procedure events: hypoxia     Intra-procedure management:  Airway repositioning and BVM ventilation   Total Provider sedation time (minutes):  40 Post-procedure details:    Attendance: Constant attendance by certified staff until patient recovered     Recovery: Patient returned to pre-procedure baseline     Post-sedation assessments completed and reviewed: airway patency, cardiovascular function, hydration status, mental status, nausea/vomiting, pain level, respiratory function and temperature     Patient is stable for discharge or admission: yes     Procedure completion:  Tolerated well, no immediate complications Comments:     Hypoxia due to apnea requiring supplemental oxygen .Cardioversion  Date/Time: 11/28/2020 1:14 AM Performed by: Terald Sleeperrifan, Luke Falero J, MD Authorized by: Terald Sleeperrifan, Lashone Stauber J, MD   Consent:    Consent obtained:  Verbal   Consent given by:  Patient   Risks discussed:  Cutaneous burn, induced arrhythmia, pain and death   Alternatives discussed:  Rate-control medication, no treatment, observation and delayed treatment Pre-procedure details:    Cardioversion basis:  Elective   Rhythm:  Atrial fibrillation   Electrode placement:  Anterior-posterior Patient sedated: Yes. Refer to sedation procedure documentation for details of sedation.  Attempt one:    Cardioversion mode:  Synchronous   Waveform:  Biphasic   Shock (Joules):  150   Cardioversion outcome attempt one: Brief conversion to normal sinus rhythm, then reversion into Atrial Fibrillation. Post-procedure details:    Patient status:  Awake   Patient tolerance of procedure:  Tolerated well, no immediate complications      Medications Ordered in ED Medications  heparin ADULT infusion 100 units/mL (25000 units/26150mL) (has no administration in time range)  aspirin chewable tablet 324 mg (has no administration in time range)  ALPRAZolam (XANAX) tablet 1 mg (has  no administration in time range)  gabapentin (NEURONTIN) capsule 300 mg (300 mg Oral Given 11/28/20 0109)  acetaminophen (TYLENOL) tablet 650 mg (has no administration in time range)  ondansetron (ZOFRAN) injection 4 mg (has no administration in time range)  metoprolol tartrate (LOPRESSOR) tablet 25 mg (25 mg Oral Given 11/28/20 0109)  metoprolol tartrate (LOPRESSOR) injection 5 mg (5 mg Intravenous Given 11/27/20 2004)  metoprolol tartrate (LOPRESSOR) injection 5 mg (5 mg Intravenous Given 11/27/20 2127)  propofol (DIPRIVAN) 10 mg/mL bolus/IV push 61.2 mg (10 mg Intravenous Given 11/27/20 2302)  propofol (DIPRIVAN) 10 mg/mL bolus/IV push (61.2 mg Intravenous Given 11/27/20 2303)    ED Course  I have reviewed the triage vital signs and the nursing notes.  Pertinent labs & imaging results that were available during my care of the patient were reviewed by me and considered in my medical decision making (see chart for details).  A Fib with RVR, chest discomfort earlier today  Labs with trop 61 -> 224 on repeat.  This may be demand related from his A Fib.  He has no active chest pain or any symptoms.  Per discussion with cardiology, ACS medications held at this time.  WBC 10.8.  BMP unremarkable.  Mag 1.7.  DG chest without acute findings Tele and ECG showing A Fib with RVR, HR 110-140 bpm, no acute ischemic findings. IV metoprolol x 2 for rate conrol  Cardiology consulted - see below Patient admitted to cardiology after failed electrical cardioversion  Clinical Course as of 11/28/20 0116  Tue Nov 27, 2020  2108 HR now 140's in A Fib, we'll try a 2nd round of metoprolol IV [MT]  2222 Repeat trop with sig change to 224.  May be demand ischemia from  tachycardia.  He remains pain free and asymptomatic.  I have paged cardiology to discuss. [MT]  2235 I spoke to Dr Okey Dupre from cardiology who recommends attempted cardioversion for A Fib rate control.  As pt has no active pain, no heparin recommendation at this time.  Pt consented for cardioverison. [MT]  2334 Attempted propofol sedation for cardioversion, brief cardioversion to NSR for 10 seconds, then back into A Fib.  Awake from sedation now.  Cardiologist to admit patient, no other immediate medication recommendation.  HR improved now 110 in A Fib. [MT]    Clinical Course User Index [MT] Anaisa Radi, Kermit Balo, MD    Final Clinical Impression(s) / ED Diagnoses Final diagnoses:  Atrial fibrillation with RVR (HCC)  Elevated troponin    Rx / DC Orders ED Discharge Orders    None       Terald Sleeper, MD 11/28/20 [phone]

## 2020-11-27 NOTE — ED Triage Notes (Signed)
Pt to triage via [facility] from home.  Reports intermittent burning to center of chest that radiates to both shoulders and upper arms and generalized weakness x 1 week.  Pain worse this afternoon.  Pain resolved by the time EMS got on scene.  Denies any complaints at this time.  Afib RVR 120-160.  18g LFA.  NS 500cc.  HR down to 100-140.  Briefly converted to SR enroute and then back to Afib RVR.

## 2020-11-27 NOTE — ED Notes (Signed)
RN notified DR. Trifan about critical trop 224

## 2020-11-27 NOTE — Sedation Documentation (Signed)
Pt had an apneic episode and Dr. Renaye Rakers bagged pt. Pt started breathing 3 minutes after bagging

## 2020-11-28 DIAGNOSIS — I214 Non-ST elevation (NSTEMI) myocardial infarction: Principal | ICD-10-CM

## 2020-11-28 DIAGNOSIS — I4891 Unspecified atrial fibrillation: Secondary | ICD-10-CM

## 2020-11-28 LAB — APTT
aPTT: 75 seconds — ABNORMAL HIGH (ref 24–36)
aPTT: 61 seconds — ABNORMAL HIGH (ref 24–36)

## 2020-11-28 LAB — HEPARIN LEVEL (UNFRACTIONATED): Heparin Unfractionated: 1.78 IU/mL — ABNORMAL HIGH (ref 0.30–0.70)

## 2020-11-28 LAB — TROPONIN I (HIGH SENSITIVITY)
Troponin I (High Sensitivity): 471 ng/L (ref <18)
Troponin I (High Sensitivity): 700 ng/L (ref <18)

## 2020-11-28 LAB — HEMOGLOBIN A1C
Hgb A1c MFr Bld: 5.6 % (ref 4.8–5.6)
Mean Plasma Glucose: 114.02 mg/dL

## 2020-11-28 LAB — LIPID PANEL
Cholesterol: 152 mg/dL (ref 0–200)
HDL: 36 mg/dL — ABNORMAL LOW (ref >40)
LDL Cholesterol: 103 mg/dL — ABNORMAL HIGH (ref 0–99)
Total CHOL/HDL Ratio: 4.2 RATIO
Triglycerides: 64 mg/dL (ref <150)
VLDL: 13 mg/dL (ref 0–40)

## 2020-11-28 LAB — SARS CORONAVIRUS 2 (TAT 6-24 HRS): SARS Coronavirus 2: NEGATIVE

## 2020-11-28 LAB — TSH: TSH: 2.451 u[IU]/mL (ref 0.350–4.500)

## 2020-11-28 MED ORDER — SODIUM CHLORIDE 0.9 % IV SOLN: 250.0000 mL | INTRAVENOUS | Status: DC | PRN

## 2020-11-28 MED ORDER — SODIUM CHLORIDE 0.9% FLUSH
3.0000 mL | Freq: Two times a day (BID) | INTRAVENOUS | Status: DC
  Administered 2020-11-28 (×2): 3 mL via INTRAVENOUS

## 2020-11-28 MED ORDER — SODIUM CHLORIDE 0.9 % WEIGHT BASED INFUSION
3.0000 mL/kg/h | INTRAVENOUS | Status: DC
  Administered 2020-11-29: 3 mL/kg/h via INTRAVENOUS

## 2020-11-28 MED ORDER — ASPIRIN 81 MG PO CHEW
81.0000 mg | CHEWABLE_TABLET | ORAL | Status: AC
  Administered 2020-11-29: 81 mg via ORAL
  Filled 2020-11-28: qty 1

## 2020-11-28 MED ORDER — SODIUM CHLORIDE 0.9 % WEIGHT BASED INFUSION
1.0000 mL/kg/h | INTRAVENOUS | Status: DC
  Administered 2020-11-29: 1 mL/kg/h via INTRAVENOUS

## 2020-11-28 MED ORDER — ROSUVASTATIN CALCIUM 20 MG PO TABS
20.0000 mg | ORAL_TABLET | Freq: Every day | ORAL | Status: DC
  Administered 2020-11-28 – 2020-11-29 (×2): 20 mg via ORAL
  Filled 2020-11-28 (×2): qty 1

## 2020-11-28 MED ORDER — SODIUM CHLORIDE 0.9% FLUSH: 3.0000 mL | INTRAVENOUS | Status: DC | PRN

## 2020-11-28 NOTE — Progress Notes (Signed)
IVT consulted for PIV placed r/t difficult stick for Heart Cath scheduled 3/17.  RN, Poonam contacted and the recommendation is to have night shift RN assess for PIV placement early morning.  If unable to place IV, consult IVT.

## 2020-11-28 NOTE — Progress Notes (Signed)
ANTICOAGULATION CONSULT NOTE - Follow Up Consult  Pharmacy Consult for IV Heparin (Apixaban on Hold) Indication: chest pain/ACS and atrial fibrillation  Allergies  Allergen Reactions  . Tramadol Nausea Only    Patient Measurements: Height: 5\' 7"  (170.2 cm) Weight: 59.9 kg (132 lb) IBW/kg (Calculated) : 66.1  Heparin Dosing Weight:  59.9 kg  Vital Signs: Temp: 98 F (36.7 C) (03/16 1620) Temp Source: Oral (03/16 1620) BP: 135/98 (03/16 1720) Pulse Rate: 130 (03/16 1747)  Labs: Recent Labs    11/27/20 1840 11/27/20 2040 11/28/20 0107 11/28/20 0344 11/28/20 0930 11/28/20 1613  HGB 14.6  --   --   --   --   --   HCT 45.5  --   --   --   --   --   PLT 197  --   --   --   --   --   APTT  --   --   --   --  75* 61*  HEPARINUNFRC  --   --   --   --  1.78*  --   CREATININE 1.00  --   --   --   --   --   TROPONINIHS 61* 224* 471* 700*  --   --     Estimated Creatinine Clearance: 59.1 mL/min (by C-G formula based on SCr of 1 mg/dL).  Assessment: 70 yr old male presented to the ED via EMS from work with chest discomfort. He is on apixaban PTA for afib (per med rec, last dose was on 3/15 at 1800). Troponin elevated and pt started on IV heparin. Initial heparin level is elevated as expected from effects from apixaban; aPTT was in goal range (75 sec). Given recent apixaban use, will monitor anticoagulation using aPTT until aPTT and heparin levels correlate.  Confirmatory aPTT on heparin infusion at 900 units/hr that was drawn ~7 hrs after initial aPTT was 61 sec, which is below the goal range for this pt. H/H, plt WNL. Per RN, no issues with IV or bleeding observed.  Planning for TTE tomorrow AM  Goal of Therapy:  Heparin level 0.3-0.7 units/ml aPTT 66-103 seconds Monitor platelets by anticoagulation protocol: Yes   Plan:  Increase heparin infusion to 1050 units/hr Check aPTT in 6 hrs Monitor daily aPTT, heparin level, CBC Monitor for bleeding  4/15, PharmD,  BCPS, [facility] Clinical Pharmacist 11/28/2020 6:00 PM

## 2020-11-28 NOTE — H&P (Addendum)
Cardiology History & Physical    Patient ID: [name] MRN: [id number], DOB/AGE: [birth year]   Admit date: 11/27/2020  Primary Physician: Barbie Banner, MD Primary Cardiologist: No primary care provider on file.  Patient Profile    Peter Mclean is a 70 y.o. 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  History of Present Illness    [name] states for the last week has has had intermittent episodes of heaviness across his chest and arms, like he has been carrying something heavy. They typically last an hour or less. He has difficulty relating the episodes to exertion or palpitations. However, he had a particularly severe episode that brought him to the ED, associated with palpitations and diaphoresis. He does actually describe this as intense enough to be quite uncomfortable. He has a long history of AF and has never had such symptoms associated with his AF episodes in the past. Only prior coronary evaluation was in 2015, at which time he was found to have angiographically normal coronary arteries. He called EMS this evening and his symptoms resolved around the time he arrived to the ED. However, he was found to be in AF with RVR despite resolution of his chest discomfort. Rates around 140s. He was given a couple pushes metoprolol IV in the ED with some initial improvement in his rate, but he continued to be tachycardic. He did briefly convert to sinus rhythm with EMS and ECG obtained at that time shows a LPFB but no acute ischemic changes. His initial troponin was 61 with 2 hour delta of 224. He is strictly compliant with Eliquis, so cardioversion was attempted in the ED but without success. Cardiology consulted for management of AF and to evaluate for possible  concomitant ACS.   Past Medical History   Past Medical History:  Diagnosis Date  . Anxiety   . Arthritis   . Chronic systolic CHF (congestive heart failure) (HCC)    a. Dx 09/2013 in setting of AF-RVR; EF 10-15%, possibly tachy-mediated. Cath 09/2013: normal cors. b. TEE: EF 20-25% by echo 10/2013.  Marland Kitchen Dyslipidemia (high LDL; low HDL)   . Elevated liver enzymes   . H/O hematuria   . History of inguinal hernia   . Hypertension   . NICM (nonischemic cardiomyopathy) (HCC)   . Non-compliance    a. Adm 09/2013 after having stopped his meds.  . Paroxysmal atrial fibrillation (HCC)   . Paroxysmal atrial flutter (HCC)    typical appearing  . Pseudoaneurysm following procedure (HCC)    a. RLE pseudoaneurysm after cath, spontaneously decreased in size, did not require compression.  . Sinus bradycardia    a. HR 40's in ER 11/2013 - digoxin stopped, Coreg decreased.  . Tobacco abuse     Past Surgical History:  Procedure Laterality Date  . ABLATION  06-13-14   PVI and CTI by Dr Johney Frame  . ATRIAL FIBRILLATION ABLATION N/A 06/13/2014   Procedure: ATRIAL FIBRILLATION ABLATION;  Surgeon: Gardiner Rhyme, MD;  Location: [facility] CATH LAB;  Service: Cardiovascular;  Laterality: N/A;  . CARDIOVERSION N/A 10/17/2013   Procedure: CARDIOVERSION;  Surgeon: Thurmon Fair, MD;  Location: [facility] ENDOSCOPY;  Service: Cardiovascular;  Laterality: N/A;  . CHOLECYSTECTOMY    . HERNIA REPAIR  1992 & 2011  .  LEFT AND RIGHT HEART CATHETERIZATION WITH CORONARY ANGIOGRAM N/A 10/13/2013   Procedure: LEFT AND RIGHT HEART CATHETERIZATION WITH CORONARY ANGIOGRAM;  Surgeon: Marykay Lex, MD;  Location: [facility] CATH LAB;  Service: Cardiovascular;  Laterality: N/A;  . PSEUDOANERYSM COMPRESSION  11/11/2013   Procedure: PSEUDOANERYSM COMPRESSION;  Surgeon: Pricilla Riffle, MD;  Location: [facility] CATH LAB;  Service: Cardiovascular;;  . TEE WITHOUT CARDIOVERSION N/A 10/17/2013   Procedure: TRANSESOPHAGEAL ECHOCARDIOGRAM (TEE);  Surgeon: Thurmon Fair,  MD;  Location: [facility] ENDOSCOPY;  Service: Cardiovascular;  Laterality: N/A;  . TEE WITHOUT CARDIOVERSION N/A 06/12/2014   Procedure: TRANSESOPHAGEAL ECHOCARDIOGRAM (TEE);  Surgeon: Pricilla Riffle, MD;  Location: [facility] ENDOSCOPY;  Service: Cardiovascular;  Laterality: N/A;     Allergies Allergies  Allergen Reactions  . Tramadol Nausea Only    Home Medications    Prior to Admission medications   Medication Sig Start Date End Date Taking? Authorizing Provider  albuterol (VENTOLIN HFA) 108 (90 Base) MCG/ACT inhaler 1 or 2 puffs up to 4 times a day as needed for wheezing 10/18/20   [provider]  ALPRAZolam Prudy Feeler) 1 MG tablet Take 1 mg by mouth 2 (two) times daily as needed for anxiety or sleep.    [provider]  carvedilol (COREG) 6.25 MG tablet Take 1 tablet (6.25 mg total) by mouth 2 (two) times daily with a meal. 01/03/15   Allred, Fayrene Fearing, MD  ELIQUIS 5 MG TABS tablet TAKE 1 TABLET BY MOUTH TWICE A DAY 12/01/16   Allred, Fayrene Fearing, MD  gabapentin (NEURONTIN) 300 MG capsule Take 300 mg by mouth 3 (three) times daily. 09/18/19   [provider]  HYDROcodone-acetaminophen (NORCO/VICODIN) 5-325 MG tablet Take 1 tablet by mouth every 6 (six) hours as needed for moderate pain.    [provider]  losartan (COZAAR) 100 MG tablet TAKE 1 TABLET BY MOUTH EVERY DAY 07/22/18   Newman Nip, NP    Family History    Family History  Problem Relation Age of Onset  . Hyperlipidemia Mother   . Hypertension Mother   . Heart attack Father    He indicated that his mother is alive. He indicated that his father is deceased. He indicated that his brother is alive. He indicated that his maternal grandmother is deceased. He indicated that his maternal grandfather is deceased. He indicated that his paternal grandmother is deceased. He indicated that his paternal grandfather is deceased.   Social History    Social History   Socioeconomic History  . Marital status: Married     Spouse name: Not on file  . Number of children: Not on file  . Years of education: Not on file  . Highest education level: Not on file  Occupational History  . 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 Occupation:    . Occupation:    Tobacco Use  . Smoking status: Current Some Day Smoker    Packs/day: 0.50    Years: 40.00    Pack years: 20.00    Last attempt to quit: 06/07/2014    Years since quitting: 6.4  . Smokeless tobacco: Never Used  . Tobacco comment: Has cut back to 2-3 cigarettes per day.  Substance and Sexual Activity  . Alcohol use: Yes    Alcohol/week: 0.0 standard drinks    Comment: 1-2 A DAY  . Drug use: Not on file  . Sexual activity: Not on file  Other Topics Concern  . Not on file  Social History Narrative   Lives in [place] with wife.    Social Determinants of Health   Financial Resource Strain: Not on file  Food Insecurity: Not on file  Transportation Needs: Not on file  Physical Activity: Not on file  Stress: Not on file  Social Connections: Not on file  Intimate Partner Violence: Not on file     Review of Systems    A comprehensive review of systems was performed with pertinent positives and negatives noted in the HPI.  Physical Exam    BP (!) 141/99   Pulse 76   Temp 98.7 F (37.1 C)   Resp (!) 28   Ht 5\' 7"  (1.702 m)   Wt 61.2 kg   SpO2 98%   BMI 21.14 kg/m  General: Alert, NAD HEENT: Normal  Neck: No bruits or JVD. Lungs:  Resp regular and unlabored, CTA bilaterally. Heart: Irregular rhythm, no s3, s4, or murmurs. Abdomen: Soft, non-tender, non-distended, BS +.  Extremities: Warm. No clubbing, cyanosis or edema. DP/PT/Radials 2+ and equal bilaterally. Psych: Normal affect. Neuro: Alert and oriented. No gross focal deficits. No abnormal movements.  Labs   Cardiac Panel (last 3 results) Recent Labs    11/27/20 1840 11/27/20 2040 11/28/20 0344  TROPONINIHS 61* 224* 700*    Lab  Results  Component Value Date   WBC 10.8 (H) 11/27/2020   HGB 14.6 11/27/2020   HCT 45.5 11/27/2020   MCV 94.2 11/27/2020   PLT 197 11/27/2020    Recent Labs  Lab 11/27/20 1840  NA 141  K 3.8  CL 108  CO2 26  BUN 16  CREATININE 1.00  CALCIUM 9.0  GLUCOSE 106*   Lab Results  Component Value Date   CHOL 162 10/13/2013   HDL 34 (L) 10/13/2013   LDLCALC 113 (H) 10/13/2013   TRIG 74 10/13/2013   Lab Results  Component Value Date   DDIMER 0.30 02/23/2011     Radiology Studies    DG Chest 2 View  Result Date: 11/27/2020 CLINICAL DATA:  Chest pain and body aches.  Weakness. EXAM: CHEST - 2 VIEW COMPARISON:  12/13/2013. FINDINGS: Chronic hyperinflation and peribronchial thickening. Normal heart size and mediastinal contours. No acute airspace disease. No pleural effusion or pneumothorax. Normal pulmonary vasculature. No acute osseous abnormalities are seen. IMPRESSION: 1. No acute abnormality. 2. Mild chronic hyperinflation and bronchial thickening, likely smoking related. Electronically Signed   By: Narda Rutherford M.D.   On: 11/27/2020 19:16    ECG & Cardiac Imaging    ECG: Atrial flutter with variable conduction vs very coarse/organized AF, left posterior fasicular block, no acute ischemic changes - personally reviewed.  TTE 05/25/2020: 1. Left ventricular ejection fraction, by estimation, is 55 to 60%. The  left ventricle has normal function. The left ventricle has no regional  wall motion abnormalities. Left ventricular diastolic function could not  be evaluated.  2. Right ventricular systolic function is normal. The right ventricular  size is normal.  3. The mitral valve is normal in structure. Trivial mitral valve  regurgitation. No evidence of mitral stenosis.  4. The aortic valve is normal in structure. Aortic valve regurgitation is  not visualized. No aortic stenosis is present.  5. The inferior vena cava is normal in size with greater than 50%  respiratory  variability, suggesting right atrial pressure of 3 mmHg.   Assessment & Plan    NSTEMI: Symptoms do not correlate well with the AF and have not been present with prior  episodes. There actually does seem to be some correlation with activity as well. His troponin rise is not what I would expect with AF at these rates, particularly given the chronicity of his AF. Will treat as ACS.  - Continue to trend troponin. Note that subsequent values may be confounded by attempted DCCV. - Monitor on telemetry. Update lipids and A1c.  - Heparin bolus and ggt, holding Eliquis - Load aspirin 324mg  - Start metoprolol 25mg  BID. SL nitro prn. - Start rosuvastatin 20mg . He also had a history of TIA. - TTE in the morning - NPO for coronary angiography  Atrial fibrillation/flutter with RVR:  History of PVI in 2015, current rhythm is quite organized for AF and may be atrial flutter. While this could explain his symptoms, I don't think it explains his troponin rise. He has been on uninterrupted anticoagulation. Cardioversion was attempted in the ED without success. If coronary angiography does not reveal a culprit lesion, he probably warrants initiation of AAD (ie sotalol).  - Transition Eliquis to heparin as above - Start metoprolol 25mg  BID - If no culprit coronary lesion, would consider inpatient AAD initiation (eg sotalol) and cardioversion prior to discharge if still in AF - TTE as above.  - Will also recheck TSH as his weight has been trending down.  HF with recovered EF: Seems compensated/euvolemic. Last EF was normal. He does not require diuretic and is not on any specific GDMT.  - Update TTE as above    Nutrition: NPO DVT ppx: heparin ggt Advanced Care Planning: Full Code   Signed, , MD 11/28/2020, 1:14 AM   Personally seen and examined. Agree with fellow above with the following comments: Briefly 70 yo M with hx of AF and prior amiodarone intolerance who presented with angina on  exertion and presently with NSTEMI Patient notes no chest pain at rest; has many concerns of holding his Eliquis for washout in the setting of heart catheterization Exam notable for regular rate and rhythm, +2 femoral pulses and +2 radial pulses Labs notable for rising troponin 224-> 471-> 700 Personally reviewed relevant tests; Patient spontaneously converted to SR 142 AM (has attempted DCCV per chart review. Would recommend deferring LHC to 11/29/20 for DOAC washout - getting echo today - TSH WNL;  - continue heparin - planned for [facility] 11/29/20, to that end risks and benefits of cardiac catheterization have been discussed with the patient.  These include bleeding, infection, kidney damage, stroke, heart attack, death.  The patient understands these risks and is willing to proceed. - cardiac diet ordered; NPO at midnight - lab orders placed  78, MD Cardiologist [facility]  [address] [place], [address] [place], [facility] [address] [phone]  11:42 AM

## 2020-11-28 NOTE — Progress Notes (Signed)
ANTICOAGULATION CONSULT NOTE - Initial Consult  Pharmacy Consult for Heparin (Apixaban on hold) Indication: chest pain/ACS and atrial fibrillation  Allergies  Allergen Reactions  . Tramadol Nausea Only    Patient Measurements: Height: 5\' 7"  (170.2 cm) Weight: 61.2 kg (135 lb) IBW/kg (Calculated) : 66.1  Vital Signs: Temp: 98.7 F (37.1 C) (03/15 1837) BP: 141/99 (03/16 0106) Pulse Rate: 76 (03/16 0109)  Labs: Recent Labs    11/27/20 1840 11/27/20 2040  HGB 14.6  --   HCT 45.5  --   PLT 197  --   CREATININE 1.00  --   TROPONINIHS 61* 224*    Estimated Creatinine Clearance: 60.4 mL/min (by C-G formula based on SCr of 1 mg/dL).   Medical History: Past Medical History:  Diagnosis Date  . Anxiety   . Arthritis   . Chronic systolic CHF (congestive heart failure) (HCC)    a. Dx 09/2013 in setting of AF-RVR; EF 10-15%, possibly tachy-mediated. Cath 09/2013: normal cors. b. TEE: EF 20-25% by echo 10/2013.  11/2013 Dyslipidemia (high LDL; low HDL)   . Elevated liver enzymes   . H/O hematuria   . History of inguinal hernia   . Hypertension   . NICM (nonischemic cardiomyopathy) (HCC)   . Non-compliance    a. Adm 09/2013 after having stopped his meds.  . Paroxysmal atrial fibrillation (HCC)   . Paroxysmal atrial flutter (HCC)    typical appearing  . Pseudoaneurysm following procedure (HCC)    a. RLE pseudoaneurysm after cath, spontaneously decreased in size, did not require compression.  . Sinus bradycardia    a. HR 40's in ER 11/2013 - digoxin stopped, Coreg decreased.  . Tobacco abuse      Assessment: 70 y/o M presents to the ED via EMS from work with chest discomfort. He is on Apixaban PTA for afib. It has been >12 hours since last Apixaban dose, will start heparin now. Anticipate using aPTT to dose for now. CBC/renal function good.   Goal of Therapy:  Heparin level 70-0.7 units/ml Monitor platelets by anticoagulation protocol: Yes   Plan:  Start heparin drip at 900  units/hr 0930 aPTT/HL Daily CBC/aPTT/HL Monitor for bleeding  78, PharmD, BCPS Clinical Pharmacist Phone: [phone]

## 2020-11-28 NOTE — ED Notes (Signed)
Assessment complete, denies any pain, a/o x 4, no complaints, heparin infusing at 900u/hr to LFA

## 2020-11-28 NOTE — ED Notes (Signed)
Pt ambulated to bathroom and was able to have a bowel movement. O2 was 96-98 while nasal cannula was off. Pt requested to leave it off, advised I would and monitor O2.

## 2020-11-28 NOTE — Progress Notes (Signed)
ANTICOAGULATION CONSULT NOTE  Pharmacy Consult for Heparin (Apixaban on hold) Indication: chest pain/ACS and atrial fibrillation  Allergies  Allergen Reactions  . Tramadol Nausea Only    Patient Measurements: Height: 5\' 7"  (170.2 cm) Weight: 61.2 kg (135 lb) IBW/kg (Calculated) : 66.1  Vital Signs: Temp: 98 F (36.7 C) (03/16 0752) Temp Source: Oral (03/16 0752) BP: 109/73 (03/16 1000) Pulse Rate: 64 (03/16 1000)  Labs: Recent Labs    11/27/20 1840 11/27/20 2040 11/28/20 0107 11/28/20 0344 11/28/20 0930  HGB 14.6  --   --   --   --   HCT 45.5  --   --   --   --   PLT 197  --   --   --   --   APTT  --   --   --   --  75*  HEPARINUNFRC  --   --   --   --  1.78*  CREATININE 1.00  --   --   --   --   TROPONINIHS 61* 224* 471* 700*  --     Estimated Creatinine Clearance: 60.4 mL/min (by C-G formula based on SCr of 1 mg/dL).  Assessment: 70 y/o M presents to the ED via EMS from work with chest discomfort. He is on Apixaban PTA for afib. Troponin elevated and pt started on heparin. Initial heparin level is elevated as expected from effects from apixaban. APTT is at goal. No bleeding noted.   Goal of Therapy:  Heparin level 0.3-0.7 units/ml aPTT 66-103 seconds Monitor platelets by anticoagulation protocol: Yes   Plan:  Continue heparin gtt 900 units/hr Check a 6 hr aPTT Daily CBC/aPTT/HL Monitor for bleeding  78, PharmD, BCPS Clinical Pharmacist Please see AMION for all pharmacy numbers 11/28/2020 11:17 AM

## 2020-11-28 NOTE — ED Notes (Signed)
Report to [place], will transfer pt to room as soon as room is ready, pt is aware of this, sandwich given to pt

## 2020-11-28 NOTE — ED Notes (Signed)
Pt to 6E-30 via stretcher by EDT

## 2020-11-28 NOTE — Progress Notes (Signed)
   11/28/20 1620  Assess: MEWS Score  Temp 98 F (36.7 C)  BP (!) 142/89  Pulse Rate (!) 106  ECG Heart Rate (!) 129  Resp 20  Level of Consciousness Alert  SpO2 97 %  O2 Device Room Air  Assess: MEWS Score  MEWS Temp 0  MEWS Systolic 0  MEWS Pulse 2  MEWS RR 0  MEWS LOC 0  MEWS Score 2  MEWS Score Color Yellow  Assess: if the MEWS score is Yellow or Red  Were vital signs taken at a resting state? Yes  Focused Assessment No change from prior assessment  Early Detection of Sepsis Score *See Row Information* Low  MEWS guidelines implemented *See Row Information* Yes  Treat  Pain Scale 0-10  Pain Score 0  Take Vital Signs  Increase Vital Sign Frequency  Yellow: Q 2hr X 2 then Q 4hr X 2, if remains yellow, continue Q 4hrs  Escalate  MEWS: Escalate Yellow: discuss with charge nurse/RN and consider discussing with provider and RRT  Notify: Charge Nurse/RN  Name of Charge Nurse/RN Notified Abigail, RN  Date Charge Nurse/RN Notified 11/28/20  Time Charge Nurse/RN Notified 1620  Notify: Provider  Provider Name/Title Bhagat, PA  Date Provider Notified 11/28/20  Time Provider Notified 1742  Notification Type Call  Notification Reason Change in status (HR Increased)  Provider response Evaluate remotely  Date of Provider Response 11/28/20  Time of Provider Response 1742  Document  Patient Outcome Other (Comment) (Administered metoprolol)  Progress note created (see row info) Yes

## 2020-11-29 ENCOUNTER — Inpatient Hospital Stay (HOSPITAL_COMMUNITY): Payer: Medicare HMO

## 2020-11-29 ENCOUNTER — Encounter (HOSPITAL_COMMUNITY)
Admission: EM | Disposition: A | Discharge status: Home / Self Care | Payer: Self-pay | Source: Home / Self Care | Attending: Internal Medicine

## 2020-11-29 DIAGNOSIS — I214 Non-ST elevation (NSTEMI) myocardial infarction: Secondary | ICD-10-CM

## 2020-11-29 DIAGNOSIS — I2510 Atherosclerotic heart disease of native coronary artery without angina pectoris: Secondary | ICD-10-CM

## 2020-11-29 HISTORY — PX: CORONARY STENT INTERVENTION: CATH118234

## 2020-11-29 HISTORY — PX: INTRAVASCULAR IMAGING/OCT: CATH118326

## 2020-11-29 HISTORY — PX: LEFT HEART CATH AND CORONARY ANGIOGRAPHY: CATH118249

## 2020-11-29 LAB — BASIC METABOLIC PANEL
Anion gap: 10 (ref 5–15)
BUN: 17 mg/dL (ref 8–23)
CO2: 22 mmol/L (ref 22–32)
Calcium: 8.9 mg/dL (ref 8.9–10.3)
Chloride: 107 mmol/L (ref 98–111)
Creatinine, Ser: 0.89 mg/dL (ref 0.61–1.24)
GFR, Estimated: >60 mL/min (ref >60)
Glucose, Bld: 80 mg/dL (ref 70–99)
Potassium: 4 mmol/L (ref 3.5–5.1)
Sodium: 139 mmol/L (ref 135–145)

## 2020-11-29 LAB — CBC
HCT: 42.3 % (ref 39.0–52.0)
Hemoglobin: 14.3 g/dL (ref 13.0–17.0)
MCH: 31.1 pg (ref 26.0–34.0)
MCHC: 33.8 g/dL (ref 30.0–36.0)
MCV: 92 fL (ref 80.0–100.0)
Platelets: 154 10*3/uL (ref 150–400)
RBC: 4.6 MIL/uL (ref 4.22–5.81)
RDW: 13.7 % (ref 11.5–15.5)
WBC: 9.6 10*3/uL (ref 4.0–10.5)
nRBC: 0 % (ref 0.0–0.2)

## 2020-11-29 LAB — POCT ACTIVATED CLOTTING TIME
Activated Clotting Time: 339 seconds
Activated Clotting Time: 291 seconds

## 2020-11-29 LAB — HEPARIN LEVEL (UNFRACTIONATED)
Heparin Unfractionated: 0.67 IU/mL (ref 0.30–0.70)
Heparin Unfractionated: 0.66 IU/mL (ref 0.30–0.70)

## 2020-11-29 LAB — APTT
aPTT: 100 seconds — ABNORMAL HIGH (ref 24–36)
aPTT: 112 seconds — ABNORMAL HIGH (ref 24–36)

## 2020-11-29 LAB — ECHOCARDIOGRAM COMPLETE
Height: 67 in
S' Lateral: 3.1 cm
Weight: 2112 oz

## 2020-11-29 SURGERY — LEFT HEART CATH AND CORONARY ANGIOGRAPHY
Anesthesia: LOCAL

## 2020-11-29 MED ORDER — SODIUM CHLORIDE 0.9 % IV SOLN: INTRAVENOUS | Status: AC

## 2020-11-29 MED ORDER — CLOPIDOGREL BISULFATE 300 MG PO TABS
ORAL_TABLET | ORAL | Status: DC | PRN
  Administered 2020-11-29: 600 mg via ORAL

## 2020-11-29 MED ORDER — SODIUM CHLORIDE 0.9% FLUSH
3.0000 mL | Freq: Two times a day (BID) | INTRAVENOUS | Status: DC
  Administered 2020-11-29: 3 mL via INTRAVENOUS

## 2020-11-29 MED ORDER — TIROFIBAN HCL IN NACL 5-0.9 MG/100ML-% IV SOLN
INTRAVENOUS | Status: AC | PRN
  Administered 2020-11-29: 0.15 ug/kg/min via INTRAVENOUS

## 2020-11-29 MED ORDER — MIDAZOLAM HCL 2 MG/2ML IJ SOLN
INTRAMUSCULAR | Status: DC | PRN
  Administered 2020-11-29: 1 mg via INTRAVENOUS

## 2020-11-29 MED ORDER — LABETALOL HCL 5 MG/ML IV SOLN
10.0000 mg | INTRAVENOUS | Status: AC | PRN
Start: 2020-11-29 — End: 2020-11-29

## 2020-11-29 MED ORDER — HEPARIN (PORCINE) IN NACL 1000-0.9 UT/500ML-% IV SOLN
INTRAVENOUS | Status: DC | PRN
  Administered 2020-11-29 (×3): 500 mL

## 2020-11-29 MED ORDER — IOHEXOL 350 MG/ML SOLN
INTRAVENOUS | Status: DC | PRN
  Administered 2020-11-29: 145 mL

## 2020-11-29 MED ORDER — LIDOCAINE HCL (PF) 1 % IJ SOLN
INTRAMUSCULAR | Status: AC
  Filled 2020-11-29: qty 30

## 2020-11-29 MED ORDER — SODIUM CHLORIDE 0.9% FLUSH: 3.0000 mL | INTRAVENOUS | Status: DC | PRN

## 2020-11-29 MED ORDER — VERAPAMIL HCL 2.5 MG/ML IV SOLN
INTRAVENOUS | Status: AC
  Filled 2020-11-29: qty 2

## 2020-11-29 MED ORDER — HEPARIN (PORCINE) IN NACL 1000-0.9 UT/500ML-% IV SOLN
INTRAVENOUS | Status: AC
  Filled 2020-11-29: qty 500

## 2020-11-29 MED ORDER — HEPARIN SODIUM (PORCINE) 1000 UNIT/ML IJ SOLN
INTRAMUSCULAR | Status: AC
  Filled 2020-11-29: qty 1

## 2020-11-29 MED ORDER — TIROFIBAN HCL IN NACL 5-0.9 MG/100ML-% IV SOLN
INTRAVENOUS | Status: AC
  Filled 2020-11-29: qty 100

## 2020-11-29 MED ORDER — LIDOCAINE HCL (PF) 1 % IJ SOLN
INTRAMUSCULAR | Status: DC | PRN
  Administered 2020-11-29: 2 mL

## 2020-11-29 MED ORDER — TIROFIBAN HCL IN NACL 5-0.9 MG/100ML-% IV SOLN
0.1500 ug/kg/min | INTRAVENOUS | Status: AC
  Administered 2020-11-29 (×2): 0.15 ug/kg/min via INTRAVENOUS
  Filled 2020-11-29 (×2): qty 100

## 2020-11-29 MED ORDER — TIROFIBAN (AGGRASTAT) BOLUS VIA INFUSION
INTRAVENOUS | Status: DC | PRN
  Administered 2020-11-29: 1497.5 ug via INTRAVENOUS

## 2020-11-29 MED ORDER — ACETAMINOPHEN 325 MG PO TABS: 650.0000 mg | ORAL_TABLET | ORAL | Status: DC | PRN

## 2020-11-29 MED ORDER — FAMOTIDINE IN NACL 20-0.9 MG/50ML-% IV SOLN
INTRAVENOUS | Status: AC
  Filled 2020-11-29: qty 50

## 2020-11-29 MED ORDER — CLOPIDOGREL BISULFATE 75 MG PO TABS
75.0000 mg | ORAL_TABLET | Freq: Every day | ORAL | Status: DC
  Administered 2020-11-30: 75 mg via ORAL
  Filled 2020-11-29: qty 1

## 2020-11-29 MED ORDER — MIDAZOLAM HCL 2 MG/2ML IJ SOLN
INTRAMUSCULAR | Status: AC
  Filled 2020-11-29: qty 2

## 2020-11-29 MED ORDER — FENTANYL CITRATE (PF) 100 MCG/2ML IJ SOLN
INTRAMUSCULAR | Status: DC | PRN
  Administered 2020-11-29 (×3): 25 ug via INTRAVENOUS

## 2020-11-29 MED ORDER — HEPARIN SODIUM (PORCINE) 1000 UNIT/ML IJ SOLN
INTRAMUSCULAR | Status: DC | PRN
  Administered 2020-11-29: 3000 [IU] via INTRAVENOUS
  Administered 2020-11-29: 4000 [IU] via INTRAVENOUS

## 2020-11-29 MED ORDER — MORPHINE SULFATE (PF) 2 MG/ML IV SOLN: 2.0000 mg | INTRAVENOUS | Status: DC | PRN

## 2020-11-29 MED ORDER — FAMOTIDINE IN NACL 20-0.9 MG/50ML-% IV SOLN
INTRAVENOUS | Status: DC | PRN
  Administered 2020-11-29: 20 mg via INTRAVENOUS

## 2020-11-29 MED ORDER — HYDRALAZINE HCL 20 MG/ML IJ SOLN: 10.0000 mg | INTRAMUSCULAR | Status: AC | PRN

## 2020-11-29 MED ORDER — NITROGLYCERIN 1 MG/10 ML FOR IR/CATH LAB
INTRA_ARTERIAL | Status: AC
  Filled 2020-11-29: qty 10

## 2020-11-29 MED ORDER — ATORVASTATIN CALCIUM 80 MG PO TABS
80.0000 mg | ORAL_TABLET | Freq: Every day | ORAL | Status: DC
  Administered 2020-11-30: 80 mg via ORAL
  Filled 2020-11-29: qty 1

## 2020-11-29 MED ORDER — FENTANYL CITRATE (PF) 100 MCG/2ML IJ SOLN
INTRAMUSCULAR | Status: AC
  Filled 2020-11-29: qty 2

## 2020-11-29 MED ORDER — ONDANSETRON HCL 4 MG/2ML IJ SOLN: 4.0000 mg | Freq: Four times a day (QID) | INTRAMUSCULAR | Status: DC | PRN

## 2020-11-29 MED ORDER — VERAPAMIL HCL 2.5 MG/ML IV SOLN
INTRA_ARTERIAL | Status: DC | PRN
  Administered 2020-11-29: 15 mL via INTRA_ARTERIAL

## 2020-11-29 MED ORDER — ASPIRIN 81 MG PO CHEW
81.0000 mg | CHEWABLE_TABLET | Freq: Every day | ORAL | Status: DC
  Administered 2020-11-30: 81 mg via ORAL
  Filled 2020-11-29: qty 1

## 2020-11-29 MED ORDER — SODIUM CHLORIDE 0.9 % IV SOLN
250.0000 mL | INTRAVENOUS | Status: DC | PRN
Start: 2020-11-29 — End: 2020-11-30

## 2020-11-29 SURGICAL SUPPLY — 24 items
BALLN SAPPHIRE 2.5X12 (BALLOONS) ×2
BALLN SAPPHIRE ~~LOC~~ 3.25X15 (BALLOONS) ×2 IMPLANT
BALLOON SAPPHIRE 2.5X12 (BALLOONS) ×1 IMPLANT
CATH DRAGONFLY OPSTAR (CATHETERS) ×2 IMPLANT
CATH INFINITI 5FR ANG PIGTAIL (CATHETERS) ×2 IMPLANT
CATH OPTITORQUE TIG 4.0 5F (CATHETERS) ×2 IMPLANT
CATH TELEPORT (CATHETERS) ×2 IMPLANT
CATH VISTA GUIDE 6FR XBLAD3.5 (CATHETERS) ×2 IMPLANT
CROWN DIAMONDBACK CLASSIC 1.25 (BURR) ×2 IMPLANT
DEVICE RAD COMP TR BAND LRG (VASCULAR PRODUCTS) ×2 IMPLANT
GLIDESHEATH SLEND A-KIT 6F 22G (SHEATH) ×2 IMPLANT
GUIDEWIRE INQWIRE 1.5J.035X260 (WIRE) ×1 IMPLANT
INQWIRE 1.5J .035X260CM (WIRE) ×2
KIT ENCORE 26 ADVANTAGE (KITS) ×2 IMPLANT
KIT HEART LEFT (KITS) ×2 IMPLANT
LUBRICANT VIPERSLIDE CORONARY (MISCELLANEOUS) ×2 IMPLANT
PACK CARDIAC CATHETERIZATION (CUSTOM PROCEDURE TRAY) ×2 IMPLANT
STENT RESOLUTE ONYX 3.0X30 (Permanent Stent) ×2 IMPLANT
TRANSDUCER W/STOPCOCK (MISCELLANEOUS) ×2 IMPLANT
TUBING CIL FLEX 10 FLL-RA (TUBING) ×2 IMPLANT
WIRE ASAHI PROWATER 180CM (WIRE) ×2 IMPLANT
WIRE ASAHI PROWATER 300CM (WIRE) ×2 IMPLANT
WIRE HI TORQ VERSACORE-J 145CM (WIRE) ×2 IMPLANT
WIRE VIPERWIRE COR FLEX .012 (WIRE) ×4 IMPLANT

## 2020-11-29 NOTE — Plan of Care (Signed)
Briefl POC note Patient notes no pain post PCI Discussed in brief with patient's primary cardiologist - will start amiodarone 200 mg PO daily starting 11/30/20 - will see in AF clinic in 7-10 days - will see Dr. Johney Frame in 3-4 weeks for discussion of repeat ablation  Riley Lam, MD Cardiologist [facility]  [address] [place], [address] [place], [place] [address] [phone]  4:46 PM

## 2020-11-29 NOTE — H&P (View-Only) (Signed)
Progress Note  Patient Name: Peter Mclean Date of Encounter: 11/29/2020  [facility] Cardiologist: Allred  Subjective   He had chest heaviness and arm heaviness at 4 AM. No current chest pain.  Inpatient Medications    Scheduled Meds: . aspirin  324 mg Oral Once  . gabapentin  300 mg Oral TID  . metoprolol tartrate  25 mg Oral BID  . rosuvastatin  20 mg Oral Daily  . sodium chloride flush  3 mL Intravenous Q12H   Continuous Infusions: . sodium chloride    . sodium chloride 1 mL/kg/hr (11/29/20 0826)  . heparin 1,050 Units/hr (11/29/20 0326)   PRN Meds: sodium chloride, acetaminophen, ALPRAZolam, ondansetron (ZOFRAN) IV, sodium chloride flush   Vital Signs    Vitals:   11/29/20 0100 11/29/20 0300 11/29/20 0400 11/29/20 0755  BP:    (!) 126/14  Pulse: (!) 118 97 (!) 105 (!) 109  Resp:    18  Temp:    98.2 F (36.8 C)  TempSrc:    Oral  SpO2: 98% 93% 96% 99%  Weight:      Height:        Intake/Output Summary (Last 24 hours) at 11/29/2020 1135 Last data filed at 11/29/2020 0630 Gross per 24 hour  Intake 228.03 ml  Output 1425 ml  Net -1196.97 ml   Last 3 Weights 11/28/2020 11/27/2020 10/26/2020  Weight (lbs) 132 lb 135 lb 134 lb 12.8 oz  Weight (kg) 59.875 kg 61.236 kg 61.145 kg      Telemetry    Coarse Afib/flutter in the 80s - Personally Reviewed  ECG    No new tracings - Personally Reviewed  Physical Exam   GEN: No acute distress.   Neck: No JVD Cardiac: irregular rhythm, regular rate Respiratory: Clear to auscultation bilaterally. GI: Soft, nontender, non-distended  MS: No edema; No deformity. Neuro:  Nonfocal  Psych: Normal affect   Labs    High Sensitivity Troponin:   Recent Labs  Lab 11/27/20 1840 11/27/20 2040 11/28/20 0107 11/28/20 0344  TROPONINIHS 61* 224* 471* 700*      Chemistry Recent Labs  Lab 11/27/20 1840 11/29/20 0051  NA 141 139  K 3.8 4.0  CL 108 107  CO2 26 22  GLUCOSE 106* 80  BUN 16 17   CREATININE 1.00 0.89  CALCIUM 9.0 8.9  GFRNONAA >60 >60  ANIONGAP 7 10     Hematology Recent Labs  Lab 11/27/20 1840 11/29/20 0051  WBC 10.8* 9.6  RBC 4.83 4.60  HGB 14.6 14.3  HCT 45.5 42.3  MCV 94.2 92.0  MCH 30.2 31.1  MCHC 32.1 33.8  RDW 13.9 13.7  PLT 197 154    BNPNo results for input(s): BNP, PROBNP in the last 168 hours.   DDimer No results for input(s): DDIMER in the last 168 hours.   Radiology    DG Chest 2 View  Result Date: 11/27/2020 CLINICAL DATA:  Chest pain and body aches.  Weakness. EXAM: CHEST - 2 VIEW COMPARISON:  12/13/2013. FINDINGS: Chronic hyperinflation and peribronchial thickening. Normal heart size and mediastinal contours. No acute airspace disease. No pleural effusion or pneumothorax. Normal pulmonary vasculature. No acute osseous abnormalities are seen. IMPRESSION: 1. No acute abnormality. 2. Mild chronic hyperinflation and bronchial thickening, likely smoking related. Electronically Signed   By: Narda Rutherford M.D.   On: 11/27/2020 19:16    Cardiac Studies   Left heart cath 11/29/20 - pending  Echo 05/25/20: 1. Left ventricular ejection fraction, by  estimation, is 55 to 60%. The  left ventricle has normal function. The left ventricle has no regional  wall motion abnormalities. Left ventricular diastolic function could not  be evaluated.   2. Right ventricular systolic function is normal. The right ventricular  size is normal.   3. The mitral valve is normal in structure. Trivial mitral valve  regurgitation. No evidence of mitral stenosis.   4. The aortic valve is normal in structure. Aortic valve regurgitation is  not visualized. No aortic stenosis is present.   5. The inferior vena cava is normal in size with greater than 50%  respiratory variability, suggesting right atrial pressure of 3 mmHg.   Patient Profile     70 y.o. male with a history of HTN, heart failure with recovered EF due to NICM (likely tachyarrhythmia), persistent  AF s/p PVI in 2015, smoking, and subclinical stroke (PCIA-territory infarct on MRI) who presented with chest pain and was found to be in AF with RVR.  Assessment & Plan    Chest pain concerning for unstable angina NSTEMI - plan for heart cath today - heparin gtt running, eliquis on hold - continue ASA, BB, statin - echo ordered   Afib/flutter Chronic anticoagulation - cardioversion attempted in ER - unsuccessful; then spontaneously converted 11/28/20.  Has had intermittent AF since - recent monitor with Afib - anticoagulated with eliquis - on hold for cath - TSH WNL   Chronic systolic heart failure - EF recovered on last echo - felt to be related to tachyarrhythmia, heart cath in 2015 with normal coronaries   Hypertension - losartan 100 mg daily   Hyperlipidemia - 11/28/2020: Cholesterol 152; HDL 36; LDL Cholesterol 103; Triglycerides 64; VLDL 13 - will start 20 mg crestor-  40 mg pending cath results    For questions or updates, please contact [facility] Please consult [email or web address] for contact info under        Signed, Marcelino Duster, PA  11/29/2020, 11:35 AM    Personally seen and examined. Agree with APP above with the following comments or changes above: Briefly 70 yo M with typical anginal CP with recurrent AF (PVI and CTI line in 2015); amiodarone transaminitis, but this patient notes this is also around the time of getting his gallbladder removed Patient notes no chest pain and is awaiting LHC and hungry Exam notable for irregular heart rate and rhythm Labs notable for LDL above goal of 70 Personally reviewed relevant tests; in and out of AF rvr through course Would recommend cath; follow up echo Will get EKG in the AM of 11/30/20; low threshold to consult EP in AM for consideration of sotalol load  Riley Lam, MD Cardiologist [facility]  [address] [place], [address] [place], [place] [address] [phone]  1:07 PM

## 2020-11-29 NOTE — Progress Notes (Addendum)
 Progress Note  Patient Name: Peter Mclean Date of Encounter: 11/29/2020  [facility] Cardiologist: Allred  Subjective   He had chest heaviness and arm heaviness at 4 AM. No current chest pain.  Inpatient Medications    Scheduled Meds: . aspirin  324 mg Oral Once  . gabapentin  300 mg Oral TID  . metoprolol tartrate  25 mg Oral BID  . rosuvastatin  20 mg Oral Daily  . sodium chloride flush  3 mL Intravenous Q12H   Continuous Infusions: . sodium chloride    . sodium chloride 1 mL/kg/hr (11/29/20 0826)  . heparin 1,050 Units/hr (11/29/20 0326)   PRN Meds: sodium chloride, acetaminophen, ALPRAZolam, ondansetron (ZOFRAN) IV, sodium chloride flush   Vital Signs    Vitals:   11/29/20 0100 11/29/20 0300 11/29/20 0400 11/29/20 0755  BP:    (!) 126/14  Pulse: (!) 118 97 (!) 105 (!) 109  Resp:    18  Temp:    98.2 F (36.8 C)  TempSrc:    Oral  SpO2: 98% 93% 96% 99%  Weight:      Height:        Intake/Output Summary (Last 24 hours) at 11/29/2020 1135 Last data filed at 11/29/2020 0630 Gross per 24 hour  Intake 228.03 ml  Output 1425 ml  Net -1196.97 ml   Last 3 Weights 11/28/2020 11/27/2020 10/26/2020  Weight (lbs) 132 lb 135 lb 134 lb 12.8 oz  Weight (kg) 59.875 kg 61.236 kg 61.145 kg      Telemetry    Coarse Afib/flutter in the 80s - Personally Reviewed  ECG    No new tracings - Personally Reviewed  Physical Exam   GEN: No acute distress.   Neck: No JVD Cardiac: irregular rhythm, regular rate Respiratory: Clear to auscultation bilaterally. GI: Soft, nontender, non-distended  MS: No edema; No deformity. Neuro:  Nonfocal  Psych: Normal affect   Labs    High Sensitivity Troponin:   Recent Labs  Lab 11/27/20 1840 11/27/20 2040 11/28/20 0107 11/28/20 0344  TROPONINIHS 61* 224* 471* 700*      Chemistry Recent Labs  Lab 11/27/20 1840 11/29/20 0051  NA 141 139  K 3.8 4.0  CL 108 107  CO2 26 22  GLUCOSE 106* 80  BUN 16 17   CREATININE 1.00 0.89  CALCIUM 9.0 8.9  GFRNONAA >60 >60  ANIONGAP 7 10     Hematology Recent Labs  Lab 11/27/20 1840 11/29/20 0051  WBC 10.8* 9.6  RBC 4.83 4.60  HGB 14.6 14.3  HCT 45.5 42.3  MCV 94.2 92.0  MCH 30.2 31.1  MCHC 32.1 33.8  RDW 13.9 13.7  PLT 197 154    BNPNo results for input(s): BNP, PROBNP in the last 168 hours.   DDimer No results for input(s): DDIMER in the last 168 hours.   Radiology    DG Chest 2 View  Result Date: 11/27/2020 CLINICAL DATA:  Chest pain and body aches.  Weakness. EXAM: CHEST - 2 VIEW COMPARISON:  12/13/2013. FINDINGS: Chronic hyperinflation and peribronchial thickening. Normal heart size and mediastinal contours. No acute airspace disease. No pleural effusion or pneumothorax. Normal pulmonary vasculature. No acute osseous abnormalities are seen. IMPRESSION: 1. No acute abnormality. 2. Mild chronic hyperinflation and bronchial thickening, likely smoking related. Electronically Signed   By: Melanie  Sanford M.D.   On: 11/27/2020 19:16    Cardiac Studies   Left heart cath 11/29/20 - pending  Echo 05/25/20: 1. Left ventricular ejection fraction, by   estimation, is 55 to 60%. The  left ventricle has normal function. The left ventricle has no regional  wall motion abnormalities. Left ventricular diastolic function could not  be evaluated.   2. Right ventricular systolic function is normal. The right ventricular  size is normal.   3. The mitral valve is normal in structure. Trivial mitral valve  regurgitation. No evidence of mitral stenosis.   4. The aortic valve is normal in structure. Aortic valve regurgitation is  not visualized. No aortic stenosis is present.   5. The inferior vena cava is normal in size with greater than 50%  respiratory variability, suggesting right atrial pressure of 3 mmHg.   Patient Profile     70 y.o. male with a history of HTN, heart failure with recovered EF due to NICM (likely tachyarrhythmia), persistent  AF s/p PVI in 2015, smoking, and subclinical stroke (PCIA-territory infarct on MRI) who presented with chest pain and was found to be in AF with RVR.  Assessment & Plan    Chest pain concerning for unstable angina NSTEMI - plan for heart cath today - heparin gtt running, eliquis on hold - continue ASA, BB, statin - echo ordered   Afib/flutter Chronic anticoagulation - cardioversion attempted in ER - unsuccessful; then spontaneously converted 11/28/20.  Has had intermittent AF since - recent monitor with Afib - anticoagulated with eliquis - on hold for cath - TSH WNL   Chronic systolic heart failure - EF recovered on last echo - felt to be related to tachyarrhythmia, heart cath in 2015 with normal coronaries   Hypertension - losartan 100 mg daily   Hyperlipidemia - 11/28/2020: Cholesterol 152; HDL 36; LDL Cholesterol 103; Triglycerides 64; VLDL 13 - will start 20 mg crestor-  40 mg pending cath results    For questions or updates, please contact [facility] Please consult [email or web address] for contact info under        Signed, Marcelino Duster, PA  11/29/2020, 11:35 AM    Personally seen and examined. Agree with APP above with the following comments or changes above: Briefly 70 yo M with typical anginal CP with recurrent AF (PVI and CTI line in 2015); amiodarone transaminitis, but this patient notes this is also around the time of getting his gallbladder removed Patient notes no chest pain and is awaiting LHC and hungry Exam notable for irregular heart rate and rhythm Labs notable for LDL above goal of 70 Personally reviewed relevant tests; in and out of AF rvr through course Would recommend cath; follow up echo Will get EKG in the AM of 11/30/20; low threshold to consult EP in AM for consideration of sotalol load  Riley Lam, MD Cardiologist [facility]  [address] [place], [address] [place], [place] [address] [phone]  1:07 PM

## 2020-11-29 NOTE — Interval H&P Note (Signed)
Cath Lab Visit (complete for each Cath Lab visit)  Clinical Evaluation Leading to the Procedure:   ACS: No.  Non-ACS:    Anginal Classification: CCS I  Anti-ischemic medical therapy: Minimal Therapy (1 class of medications)  Non-Invasive Test Results: No non-invasive testing performed  Prior CABG: No previous CABG      History and Physical Interval Note:  11/29/2020 1:38 PM  Peter Mclean  has presented today for surgery, with the diagnosis of chest pain.  The various methods of treatment have been discussed with the patient and family. After consideration of risks, benefits and other options for treatment, the patient has consented to  Procedure(s): LEFT HEART CATH AND CORONARY ANGIOGRAPHY (N/A) as a surgical intervention.  The patient's history has been reviewed, patient examined, no change in status, stable for surgery.  I have reviewed the patient's chart and labs.  Questions were answered to the patient's satisfaction.     [name]

## 2020-11-29 NOTE — Progress Notes (Signed)
ANTICOAGULATION CONSULT NOTE - Follow Up Consult  Pharmacy Consult for heparin Indication: NSTEMI and Afib  Labs: Recent Labs    11/27/20 1840 11/27/20 2040 11/28/20 0107 11/28/20 0344 11/28/20 0930 11/28/20 1613 11/29/20 0051  HGB 14.6  --   --   --   --   --  14.3  HCT 45.5  --   --   --   --   --  42.3  PLT 197  --   --   --   --   --  154  APTT  --   --   --   --  75* 61* 100*  HEPARINUNFRC  --   --   --   --  1.78*  --  0.67  CREATININE 1.00  --   --   --   --   --  0.89  TROPONINIHS 61* 224* 471* 700*  --   --   --     Assessment/Plan:  70yo male therapeutic on heparin after rate change. Will continue gtt at current rate of 1050 units/hr and confirm stable with additional level.   Vernard Gambles, PharmD, BCPS  11/29/2020,2:45 AM

## 2020-11-29 NOTE — Progress Notes (Signed)
  Echocardiogram 2D Echocardiogram has been performed.  Peter Mclean 11/29/2020, 1:30 PM

## 2020-11-29 NOTE — Progress Notes (Signed)
ANTICOAGULATION CONSULT NOTE - Follow Up Consult  Pharmacy Consult for IV Heparin (Apixaban on Hold) Indication: chest pain/ACS and atrial fibrillation  Allergies  Allergen Reactions  . Tramadol Nausea Only    Patient Measurements: Height: 5\' 7"  (170.2 cm) Weight: 59.9 kg (132 lb) IBW/kg (Calculated) : 66.1  Heparin Dosing Weight:  59.9 kg  Vital Signs: Temp: 98 F (36.7 C) (03/17 1200) Temp Source: Oral (03/17 1200) BP: 134/98 (03/17 1200) Pulse Rate: 101 (03/17 1200)  Labs: Recent Labs    11/27/20 1840 11/27/20 2040 11/28/20 0107 11/28/20 0344 11/28/20 0930 11/28/20 0930 11/28/20 1613 11/29/20 0051 11/29/20 0747  HGB 14.6  --   --   --   --   --   --  14.3  --   HCT 45.5  --   --   --   --   --   --  42.3  --   PLT 197  --   --   --   --   --   --  154  --   APTT  --   --   --   --  75*   < > 61* 100* 112*  HEPARINUNFRC  --   --   --   --  1.78*  --   --  0.67 0.66  CREATININE 1.00  --   --   --   --   --   --  0.89  --   TROPONINIHS 61* 224* 471* 700*  --   --   --   --   --    < > = values in this interval not displayed.    Estimated Creatinine Clearance: 66.4 mL/min (by C-G formula based on SCr of 0.89 mg/dL).  Assessment: 70 yr old male presented to the ED via EMS from work with chest discomfort. He is on apixaban PTA for afib (per med rec, last dose was on 3/15 at 1800). Troponin elevated and pt started on IV heparin. Plans for cath today -heparin level at goal , CBC stable   Goal of Therapy:  Heparin level 0.3-0.7 units/ml aPTT 66-103 seconds Monitor platelets by anticoagulation protocol: Yes   Plan:  -No heparin changes needed -Will follow plans post cath  4/15, PharmD Clinical Pharmacist **Pharmacist phone directory can now be found on [email or web address] ([email or web address]).  Listed under [facility].

## 2020-11-29 NOTE — Progress Notes (Signed)
Pt left arm infiltrated and little tender from the IV Aggrastat infusion. Switched to right upper arm iv access

## 2020-11-30 ENCOUNTER — Encounter (HOSPITAL_COMMUNITY): Payer: Self-pay | Admitting: Cardiovascular Disease

## 2020-11-30 LAB — HEPATIC FUNCTION PANEL
ALT: 15 U/L (ref 0–44)
AST: 14 U/L — ABNORMAL LOW (ref 15–41)
Albumin: 2.8 g/dL — ABNORMAL LOW (ref 3.5–5.0)
Alkaline Phosphatase: 67 U/L (ref 38–126)
Bilirubin, Direct: <0.1 mg/dL (ref 0.0–0.2)
Total Bilirubin: 1.1 mg/dL (ref 0.3–1.2)
Total Protein: 5.1 g/dL — ABNORMAL LOW (ref 6.5–8.1)

## 2020-11-30 LAB — CBC
HCT: 38.8 % — ABNORMAL LOW (ref 39.0–52.0)
Hemoglobin: 13.2 g/dL (ref 13.0–17.0)
MCH: 31.1 pg (ref 26.0–34.0)
MCHC: 34 g/dL (ref 30.0–36.0)
MCV: 91.5 fL (ref 80.0–100.0)
Platelets: 180 10*3/uL (ref 150–400)
RBC: 4.24 MIL/uL (ref 4.22–5.81)
RDW: 13.6 % (ref 11.5–15.5)
WBC: 8.4 10*3/uL (ref 4.0–10.5)
nRBC: 0 % (ref 0.0–0.2)

## 2020-11-30 LAB — BASIC METABOLIC PANEL
Anion gap: 6 (ref 5–15)
BUN: 17 mg/dL (ref 8–23)
CO2: 24 mmol/L (ref 22–32)
Calcium: 8.4 mg/dL — ABNORMAL LOW (ref 8.9–10.3)
Chloride: 109 mmol/L (ref 98–111)
Creatinine, Ser: 1.01 mg/dL (ref 0.61–1.24)
GFR, Estimated: >60 mL/min (ref >60)
Glucose, Bld: 80 mg/dL (ref 70–99)
Potassium: 3.7 mmol/L (ref 3.5–5.1)
Sodium: 139 mmol/L (ref 135–145)

## 2020-11-30 MED ORDER — METOPROLOL SUCCINATE ER 100 MG PO TB24
100.0000 mg | ORAL_TABLET | Freq: Every day | ORAL | Status: DC
  Administered 2020-11-30: 100 mg via ORAL
  Filled 2020-11-30: qty 1

## 2020-11-30 MED ORDER — METOPROLOL SUCCINATE ER 50 MG PO TB24: 150.0000 mg | ORAL_TABLET | Freq: Every day | ORAL | Status: DC

## 2020-11-30 MED ORDER — NITROGLYCERIN 0.4 MG SL SUBL: 0.4000 mg | SUBLINGUAL_TABLET | SUBLINGUAL | 3 refills | Status: DC | PRN

## 2020-11-30 MED ORDER — AMIODARONE HCL 200 MG PO TABS
200.0000 mg | ORAL_TABLET | Freq: Every day | ORAL | Status: DC
  Administered 2020-11-30: 200 mg via ORAL
  Filled 2020-11-30: qty 1

## 2020-11-30 MED ORDER — ASPIRIN EC 81 MG PO TBEC: 81.0000 mg | DELAYED_RELEASE_TABLET | Freq: Every day | ORAL | 0 refills | Status: DC

## 2020-11-30 MED ORDER — METOPROLOL TARTRATE 50 MG PO TABS: 50.0000 mg | ORAL_TABLET | Freq: Two times a day (BID) | ORAL | Status: DC

## 2020-11-30 MED ORDER — CLOPIDOGREL BISULFATE 75 MG PO TABS: 75.0000 mg | ORAL_TABLET | Freq: Every day | ORAL | 3 refills | Status: DC

## 2020-11-30 MED ORDER — AMIODARONE HCL 200 MG PO TABS: 200.0000 mg | ORAL_TABLET | Freq: Every day | ORAL | 3 refills | Status: DC

## 2020-11-30 MED ORDER — METOPROLOL SUCCINATE ER 100 MG PO TB24: 100.0000 mg | ORAL_TABLET | Freq: Every day | ORAL | 3 refills | Status: DC

## 2020-11-30 MED ORDER — METOPROLOL SUCCINATE ER 100 MG PO TB24: 150.0000 mg | ORAL_TABLET | Freq: Every day | ORAL | 3 refills | Status: DC

## 2020-11-30 MED ORDER — ATORVASTATIN CALCIUM 80 MG PO TABS: 80.0000 mg | ORAL_TABLET | Freq: Every day | ORAL | 3 refills | Status: DC

## 2020-11-30 NOTE — Care Management Important Message (Signed)
Important Message  Patient Details  Name: Peter Mclean MRN: [id number] Date of Birth: September 30, 1950   Medicare Important Message Given:  Yes     Daurice Ovando Stefan Church 11/30/2020, 12:47 PM

## 2020-11-30 NOTE — Progress Notes (Signed)
CARDIAC REHAB PHASE I   PRE:  Rate/Rhythm: 109 afib    BP: sitting 128/100    SaO2: 98 RA  MODE:  Ambulation: 370 ft   POST:  Rate/Rhythm: 161 afib    BP: sitting 148/98     SaO2: 98 RA  HR lower after getting meds earlier today. However as soon as he stood, HR up to 150-160 afib. Ambulated without c/o. HR did calm to 130 while walking but continued to increase to 160 when he got excited during education. BP elevated.  Discussed MI, stent, Plavix, restrictions, afib, smoking cessation, NTG, and CPRII. Pt receptive to quitting smoking and meds. Not interested in diet change or exercise. Will refer to [facility] CRPII.   [id number]  Harriet Masson CES, ACSM 11/30/2020 10:48 AM

## 2020-11-30 NOTE — Discharge Summary (Addendum)
Discharge Summary    Patient ID: Raheen Capili MRN: [id number]; DOB: 23-Sep-1950  Admit date: 11/27/2020 Discharge date: 11/30/2020  PCP:  Christain Sacramento, MD   [place]  Cardiologist:  Thompson Grayer, MD   Discharge Diagnoses    Principal Problem:   Non-ST elevation (NSTEMI) myocardial infarction [facility]) Active Problems:   Anxiety state   HTN (hypertension)   Dyslipidemia (high LDL; low HDL)   Tobacco abuse   Chronic systolic CHF (congestive heart failure) (HCC)   Atrial fibrillation with rapid ventricular response [facility])    Diagnostic Studies/Procedures    Left heart cath 11/29/20: Mid LAD lesion is 99% stenosed. Prox LAD to Mid LAD lesion is 50% stenosed. Post intervention, there is a 0% residual stenosis. Post intervention, there is a 0% residual stenosis. A drug-eluting stent was successfully placed using a STENT RESOLUTE ONYX 3.0X30. There is mild to moderate left ventricular systolic dysfunction. The left ventricular ejection fraction is 45-50% by visual estimate. LV end diastolic pressure is normal.  IMPRESSION: Mr. Leppo had successful orbital atherectomy followed by OCT guided PCI and drug-eluting stenting.  This revealed a ruptured plaque in the proximal LAD most likely responsible for his non-STEMI with a 99% calcified plaque just distal to this.  The final angiographic result was reduction of a 99% calcified lesion to 0% residual TIMI-3 flow.  The patient tolerated the procedure well.  The guidewire and catheter removed.  The sheath was removed and a TR band was placed on the right wrist to achieve patent hemostasis.  The patient did receive a bolus of Aggrastat and he will have a 6-hour infusion given the thrombus demonstrated on OCT.  He will need dual antiplatelet therapy uninterrupted for 12 months.  _____________  Echo 11/29/20:   1. Left ventricular ejection fraction, by estimation, is 30 to 35%. The  left ventricle has  moderately decreased function. The left ventricle  demonstrates global hypokinesis. Left ventricular diastolic function could  not be evaluated. There is of the  left ventricular,.   2. Right ventricular systolic function is normal. The right ventricular  size is normal.   3. The mitral valve is normal in structure. Mild mitral valve  regurgitation.   4. The aortic valve is normal in structure. Aortic valve regurgitation is  not visualized. No aortic stenosis is present.    History of Present Illness     Izaih Kataoka is a 70 y.o. 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 He presented with worsening chest pain found to be in Afib RVR.   Mr. Potvin states for the last week has has had intermittent episodes of heaviness across his chest and arms, like he has been carrying something heavy. They typically last an hour or less. He has difficulty relating the episodes to exertion or palpitations. However, he had a particularly severe episode that brought him to the ED, associated with palpitations and diaphoresis. He does actually describe this as intense enough to be quite uncomfortable. He has a long history of AF and has never had such symptoms associated with his AF episodes in the past. Only prior coronary evaluation was in 2015, at which time he was found to have angiographically normal coronary arteries. He called EMS this evening and his symptoms resolved around the time he arrived to the ED. However, he was found  to be in AF with RVR despite resolution of his chest discomfort. Rates around 140s. He was given a couple pushes metoprolol IV in the ED with some initial improvement in his rate, but he continued to be tachycardic. He did briefly convert to sinus rhythm with EMS and ECG obtained at that time shows a LPFB but no acute  ischemic changes. His initial troponin was 61 with 2 hour delta of 224. He is strictly compliant with Eliquis, so cardioversion was attempted in the ED but without success. Cardiology consulted for management of AF and to evaluate for possible concomitant ACS.   Hospital Course     Consultants: none  NSTEMI HS troponin peaked at 700, felt to be out of proportion to RVR. Chest pain also had features concerning for angina. LHC performed on 11/29/20 showed 99% stenosis in the mid LAD successfully treated with DES. He tolerated the procedure well. He did have reperfusion pain at that time of PCI, which has now resolved. Right radial cath site C/D/I.  He was discharged on triple therapy with ASA, plavix, and eliquis; stop ASA after 30 days.   Acute systolic heart failure Previously reduced EF felt related to RVR. EF improved on subsequent echos. He presented in RVR. Coreg changed to metoprolol. Repeat echo does show EF now 30-35%, presumably due to tachycardia. DCCV was attempted in the ER, but was unsuccessful. He remains in RVR with heart rates in the 130-150s. Given EF, transitioned to metoprolol succinate. He does not have BP room for additional GDMT. Consider adding 25 mg losartan in OP setting of BP allows - suspect he will tolerate at least a low dose of losartan (previously on 100 mg losartan prior to hospitalization).  Once heart rate +/- rhythm is controlled, recommend repeating echo.    Atrial fibrillation with RVR He presented with Afib RVR. Since he had not missed doses of [place], DCCV was attempted in the ER and was unsuccessful. In consultation with Dr. Rayann Heman, will restart 200 mg amiodarone daily with early follow up in Afib clinic. I have added on baseline LFTs this morning. He did have elevated LFTs with amiodarone in the past, but this was later felt due to gallbladder etiology. Titrated his metoprolol succinate to 150 mg daily.   Chronic anticoagulation Continue  eliquis.   Hyperlipidemia with LDL goal < 70 11/28/2020: Cholesterol 152; HDL 36; LDL Cholesterol 103; Triglycerides 64; VLDL 13 Started 80 mg lipitor. Will need repeat lipid panel in 6 weeks with LFTs.    Hypertension Metoprolol titrated to 100 mg. Will hold home 100 mg losartan for now, as above.   Current smoker He is interested in quitting and I will provide nicotine patch - currently smoke 1/2 ppd.     Did the patient have an acute coronary syndrome (MI, NSTEMI, STEMI, etc) this admission?:  Yes                               AHA/ACC Clinical Performance & Quality Measures: Aspirin prescribed? - Yes ADP Receptor Inhibitor (Plavix/Clopidogrel, Brilinta/Ticagrelor or Effient/Prasugrel) prescribed (includes medically managed patients)? - Yes Beta Blocker prescribed? - Yes High Intensity Statin (Lipitor 40-64m or Crestor 20-467m prescribed? - Yes EF assessed during THIS hospitalization? - Yes For EF <40%, was ACEI/ARB prescribed? - No - Reason:  marginal BP For EF <40%, Aldosterone Antagonist (Spironolactone or Eplerenone) prescribed? - No - Reason:  marginal BP Cardiac Rehab Phase II ordered (including medically  managed patients)? - Yes       _____________  Discharge Vitals Blood pressure (!) 114/91, pulse 93, temperature 98 F (36.7 C), temperature source Oral, resp. rate 18, height '5\' 7"'  (1.702 m), weight 60.6 kg, SpO2 98 %.  Filed Weights   11/27/20 2003 11/28/20 1318 11/30/20 0500  Weight: 61.2 kg 59.9 kg 60.6 kg    Physical Exam Constitutional:      Appearance: Normal appearance.  HENT:     Head: Normocephalic and atraumatic.  Eyes:     Extraocular Movements: Extraocular movements intact.  Neck:     Vascular: No carotid bruit.  Cardiovascular:     Rate and Rhythm: Tachycardia present. Rhythm irregular.  Pulmonary:     Effort: Pulmonary effort is normal.     Breath sounds: Normal breath sounds.  Abdominal:     General: Bowel sounds are normal.      Palpations: Abdomen is soft.  Musculoskeletal:     Right lower leg: No edema.     Left lower leg: No edema.  Skin:    General: Skin is warm and dry.  Neurological:     Mental Status: He is alert and oriented to person, place, and time.  Psychiatric:        Mood and Affect: Mood normal.        Behavior: Behavior normal.   Right radial cath C/D/I   Labs & Radiologic Studies    CBC Recent Labs    11/29/20 0051 11/30/20 0305  WBC 9.6 8.4  HGB 14.3 13.2  HCT 42.3 38.8*  MCV 92.0 91.5  PLT 154 115   Basic Metabolic Panel Recent Labs    11/27/20 1840 11/29/20 0051 11/30/20 0305  NA 141 139 139  K 3.8 4.0 3.7  CL 108 107 109  CO2 '26 22 24  ' GLUCOSE 106* 80 80  BUN '16 17 17  ' CREATININE 1.00 0.89 1.01  CALCIUM 9.0 8.9 8.4*  MG 1.7  --   --    Liver Function Tests Recent Labs    11/30/20 0305  AST 14*  ALT 15  ALKPHOS 67  BILITOT 1.1  PROT 5.1*  ALBUMIN 2.8*   No results for input(s): LIPASE, AMYLASE in the last 72 hours. High Sensitivity Troponin:   Recent Labs  Lab 11/27/20 1840 11/27/20 2040 11/28/20 0107 11/28/20 0344  TROPONINIHS 61* 224* 471* 700*    BNP Invalid input(s): POCBNP D-Dimer No results for input(s): DDIMER in the last 72 hours. Hemoglobin A1C Recent Labs    11/28/20 0344  HGBA1C 5.6   Fasting Lipid Panel Recent Labs    11/28/20 0107  CHOL 152  HDL 36*  LDLCALC 103*  TRIG 64  CHOLHDL 4.2   Thyroid Function Tests Recent Labs    11/28/20 0344  TSH 2.451   _____________  DG Chest 2 View  Result Date: 11/27/2020 CLINICAL DATA:  Chest pain and body aches.  Weakness. EXAM: CHEST - 2 VIEW COMPARISON:  12/13/2013. FINDINGS: Chronic hyperinflation and peribronchial thickening. Normal heart size and mediastinal contours. No acute airspace disease. No pleural effusion or pneumothorax. Normal pulmonary vasculature. No acute osseous abnormalities are seen. IMPRESSION: 1. No acute abnormality. 2. Mild chronic hyperinflation and  bronchial thickening, likely smoking related. Electronically Signed   By: Keith Rake M.D.   On: 11/27/2020 19:16   CARDIAC CATHETERIZATION  Result Date: 11/29/2020  Mid LAD lesion is 99% stenosed.  Prox LAD to Mid LAD lesion is 50% stenosed.  Post intervention, there is  a 0% residual stenosis.  Post intervention, there is a 0% residual stenosis.  A drug-eluting stent was successfully placed using a STENT RESOLUTE ONYX 3.0X30.  There is mild to moderate left ventricular systolic dysfunction.  The left ventricular ejection fraction is 45-50% by visual estimate.  LV end diastolic pressure is normal.  Foster Frericks is a 70 y.o. male  [id number] LOCATION:  FACILITY: [facility] PHYSICIAN: Quay Burow, M.D. [birth year] DATE OF PROCEDURE:  11/29/2020 DATE OF DISCHARGE: CARDIAC CATHETERIZATION / CSI/ DES  LAD with OCT History obtained from chart review.70 y.o. 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  His enzymes were positive.  He presents now for diagnostic coronary angiography. PROCEDURE DESCRIPTION: The patient was brought to the second floor [place] Cardiac cath lab in the postabsorptive state. He was premedicated with IV Versed and fentanyl. His right wrist was prepped and shaved in usual sterile fashion. Xylocaine 1% was used for local anesthesia. A 6 French sheath was inserted into the right radial artery using standard Seldinger technique. The patient received 4000 units  of heparin intravenously.  A 5 [place] TIG catheter pigtail catheter used for selective coronary angiography and left ventriculography respectively.  Isovue dye was used for the entirety of the case.  Retrograde aorta, ventricular and pullback pressures were recorded.  Radial cocktail was administered via the  SideArm sheath. The patient received an additional 4000 as of heparin with an ACT in the 300 range.  He received 600 mg of p.o. clopidogrel and 20 mg of Pepcid.  Using a 6 [place] XB LAD 3.5 cm guide catheter along with a FlexTip CSI Viper wire I was able to cross the highly calcified proximal to mid LAD lesion with little difficulty.  I then used a 1.25 mm coronary per performed 6 passes at low speed ultimately crossing the lesion.  I then switched out the Viper wire through a TelePort for a 01 4/300 cm length [place] guidewire.  I predilated with a 2.5 x 12 mm long balloon.  There was a small waist initially that gave nominal pressures.  I then performed OCT demonstrating a ruptured plaque in the proximal LAD with an MLA at the tightest point of atherectomy 1.23 mm.  I placed a 3 mm x 30 mm long resolute Onyx drug-eluting stent deployed at resulting reduction of a total occlusion to 0% residual.  I then postdilated with a 3.25 mm x 15 mill millimeters long noncompliant balloon at 16 atm.  The completion OCT revealed a MLA of 6.29 mm at the tightest point.   Mr. Ambrosio had successful orbital atherectomy followed by OCT guided PCI and drug-eluting stenting.  This revealed a ruptured plaque in the proximal LAD most likely responsible for his non-STEMI with a 99% calcified plaque just distal to this.  The final angiographic result was reduction of a 99% calcified lesion to 0% residual TIMI-3 flow.  The patient tolerated the procedure well.  The guidewire and catheter removed.  The sheath was removed and a TR band was placed on the right wrist to achieve patent hemostasis.  The patient did receive a bolus of Aggrastat and he will have a 6-hour infusion given the thrombus demonstrated on OCT.  He will need dual antiplatelet therapy uninterrupted for 12 months. Quay Burow. MD,  [facility] 11/29/2020 3:42 PM   ECHOCARDIOGRAM COMPLETE  Result Date: 11/29/2020    ECHOCARDIOGRAM REPORT   Patient Name:   Jakari Fortunato Curling Date of Exam: 11/29/2020 Medical Rec #:  [id number]             Height:       67.0 in Accession #:    [id number]            Weight:       132.0 lb Date of Birth:  Apr 09, 1951              BSA:          1.695 m Patient Age:    6 years              BP:           134/98 mmHg Patient Gender: M                     HR:           109 bpm. Exam Location:  Inpatient Procedure: 2D Echo Indications:    NSTEMI  History:        Patient has prior history of Echocardiogram examinations, most                 recent 05/25/2020. CHF, Arrythmias:Atrial Fibrillation and Atrial                 Flutter; Risk Factors:Hypertension and Dyslipidemia.  Sonographer:    Johny Chess Referring Phys: [id number] [place]  1. Left ventricular ejection fraction, by estimation, is 30 to 35%. The left ventricle has moderately decreased function. The left ventricle demonstrates global hypokinesis. Left ventricular diastolic function could not be evaluated. There is of the left ventricular,.  2. Right ventricular systolic function is normal. The right ventricular size is normal.  3. The mitral valve is normal in structure. Mild mitral valve regurgitation.  4. The aortic valve is normal in structure. Aortic valve regurgitation is not visualized. No aortic stenosis is present. FINDINGS  Left Ventricle: Left ventricular ejection fraction, by estimation, is 30 to 35%. The left ventricle has moderately decreased function. The left ventricle demonstrates global hypokinesis. The left ventricular internal cavity size was normal in size. There is no left ventricular hypertrophy. Left ventricular diastolic function could not be evaluated due to atrial fibrillation. Left ventricular diastolic function could not be evaluated. Right Ventricle: The right ventricular size is normal. No increase in right ventricular wall thickness. Right ventricular systolic function is normal. Left Atrium: Left atrial size was normal in size. Right Atrium:  Right atrial size was normal in size. Pericardium: There is no evidence of pericardial effusion. Mitral Valve: The mitral valve is normal in structure. Mild mitral valve regurgitation. Tricuspid Valve: The tricuspid valve is normal in structure. Tricuspid valve regurgitation is not demonstrated. No evidence of tricuspid stenosis. Aortic Valve: The aortic valve is normal in structure. Aortic valve regurgitation is not visualized. No aortic stenosis is present. Pulmonic Valve: The pulmonic valve was normal in structure. Pulmonic valve regurgitation is not visualized. Aorta: The aortic root and ascending aorta are structurally normal, with no evidence of dilitation. IAS/Shunts: The atrial septum is grossly normal.  LEFT VENTRICLE PLAX 2D LVIDd:         4.30 cm LVIDs:         3.10 cm LV PW:         0.80 cm LV IVS:  0.80 cm LVOT diam:     1.80 cm LVOT Area:     2.54 cm  IVC IVC diam: 1.80 cm LEFT ATRIUM             Index       RIGHT ATRIUM           Index LA diam:        3.40 cm 2.01 cm/m  RA Area:     12.60 cm LA Vol (A2C):   42.9 ml 25.31 ml/m RA Volume:   31.50 ml  18.59 ml/m LA Vol (A4C):   34.2 ml 20.18 ml/m LA Biplane Vol: 38.9 ml 22.95 ml/m   AORTA Ao Root diam: 2.80 cm Ao Asc diam:  2.90 cm  SHUNTS Systemic Diam: 1.80 cm Mertie Moores MD Electronically signed by Mertie Moores MD Signature Date/Time: 11/29/2020/2:46:52 PM    Final    Disposition   Pt is being discharged home today in good condition.  Follow-up Plans & Appointments     Follow-up Information     [facility] Follow up in 1 week(s).   Specialty: Cardiology Why: hospital follow up - office will call with appt Contact information: [address] [id number] [place] [phone]               Discharge Instructions     Amb Referral to Cardiac Rehabilitation   Complete by: As directed    Diagnosis:  Coronary Stents NSTEMI PTCA     After initial  evaluation and assessments completed: Virtual Based Care may be provided alone or in conjunction with Phase 2 Cardiac Rehab based on patient barriers.: Yes   Diet - low sodium heart healthy   Complete by: As directed    Discharge instructions   Complete by: As directed    No driving for 2 days. No lifting over 5 lbs for 1 week. No sexual activity for 1 week. You may return to work in 1 week. Keep procedure site clean & dry. If you notice increased pain, swelling, bleeding or pus, call/return!  You may shower, but no soaking baths/hot tubs/pools for 1 week.   Increase activity slowly   Complete by: As directed        Discharge Medications   Allergies as of 11/30/2020       Reactions   Tramadol Nausea Only        Medication List     STOP taking these medications    carvedilol 6.25 MG tablet Commonly known as: COREG   losartan 100 MG tablet Commonly known as: COZAAR       TAKE these medications    albuterol 108 (90 Base) MCG/ACT inhaler Commonly known as: VENTOLIN HFA 1 or 2 puffs up to 4 times a day as needed for wheezing   ALPRAZolam 1 MG tablet Commonly known as: XANAX Take 0.5-1 mg by mouth 4 (four) times daily as needed for anxiety or sleep.   amiodarone 200 MG tablet Commonly known as: PACERONE Take 1 tablet (200 mg total) by mouth daily.   aspirin EC 81 MG tablet Take 1 tablet (81 mg total) by mouth daily. Swallow whole.   atorvastatin 80 MG tablet Commonly known as: LIPITOR Take 1 tablet (80 mg total) by mouth daily.   clopidogrel 75 MG tablet Commonly known as: PLAVIX Take 1 tablet (75 mg total) by mouth daily with breakfast.   Eliquis 5 MG Tabs tablet Generic drug: apixaban TAKE 1 TABLET BY  MOUTH TWICE A DAY   gabapentin 300 MG capsule Commonly known as: NEURONTIN Take 300 mg by mouth 3 (three) times daily.   HYDROcodone-acetaminophen 5-325 MG tablet Commonly known as: NORCO/VICODIN Take 1 tablet by mouth every 6 (six) hours as needed  (severe headaches).   metoprolol succinate 100 MG 24 hr tablet Commonly known as: TOPROL-XL Take 1.5 tablets (150 mg total) by mouth daily. Start taking on: December 01, 2020   nitroGLYCERIN 0.4 MG SL tablet Commonly known as: Nitrostat Place 1 tablet (0.4 mg total) under the tongue every 5 (five) minutes as needed for chest pain.           Outstanding Labs/Studies   Titrate GDMT  Echo in 3 months after RVR resolved  Duration of Discharge Encounter   Greater than 30 minutes including physician time.  Signed, Tami Lin Duke, PA 11/30/2020, 1:49 PM  Personally seen and examined. Agree with APP above with the following comments: Briefly 70 yo M with HFrEF and PAF with typical anginal CP and NSTEMI s/p PCI Patient notes that he feels fine post PCI, Heart rates at DC ~ 110-120s without symptoms Exam notable for R arm without bruits or hematoma  Would recommend triple therapy for one month GDMT BB is now succinate; Coreg stopped for BP Amio stared with AF and EP follow up:  planned for potentially DCCV then possibly repeat ablation EF appears worse but without WMA; suggesting tachy-mediated component.  Rudean Haskell, MD [address] [place], [place] [address] [phone]  4:41 PM

## 2020-12-04 ENCOUNTER — Telehealth (HOSPITAL_COMMUNITY): Payer: Self-pay

## 2020-12-04 NOTE — Telephone Encounter (Signed)
Pt insurance is active and benefits verified through [facility]. Co-pay $10.00, DED $0.00/$0.00 met, out of pocket $3,900.00/$20.00 met, co-insurance 0%. No pre-authorization required. Passport, 12/04/20 @ 8:59AM, TEI#[id number]  Will contact patient to see if he is interested in the Cardiac Rehab Program. If interested, patient will need to complete follow up appt. Once completed, patient will be contacted for scheduling upon review by the RN Navigator.

## 2020-12-04 NOTE — Telephone Encounter (Signed)
Called patient to see if he is interested in the Cardiac Rehab Program. Patient expressed interest. Explained scheduling process and went over insurance, patient verbalized understanding. Will contact patient for scheduling once f/u has been completed.  °

## 2020-12-05 ENCOUNTER — Ambulatory Visit (HOSPITAL_COMMUNITY)
Admit: 2020-12-05 | Discharge: 2020-12-05 | Disposition: A | Discharge status: Home / Self Care | Payer: Medicare HMO | Attending: Nurse Practitioner | Admitting: Nurse Practitioner

## 2020-12-05 ENCOUNTER — Other Ambulatory Visit: Payer: Self-pay

## 2020-12-05 ENCOUNTER — Encounter (HOSPITAL_COMMUNITY): Payer: Self-pay | Admitting: Nurse Practitioner

## 2020-12-05 VITALS — BP 132/90 | HR 145 | Ht 67.0 in | Wt 133.4 lb

## 2020-12-05 DIAGNOSIS — Z7982 Long term (current) use of aspirin: Secondary | ICD-10-CM | POA: Diagnosis not present

## 2020-12-05 DIAGNOSIS — I4892 Unspecified atrial flutter: Secondary | ICD-10-CM | POA: Diagnosis not present

## 2020-12-05 DIAGNOSIS — Z7902 Long term (current) use of antithrombotics/antiplatelets: Secondary | ICD-10-CM | POA: Insufficient documentation

## 2020-12-05 DIAGNOSIS — D6869 Other thrombophilia: Secondary | ICD-10-CM | POA: Diagnosis not present

## 2020-12-05 DIAGNOSIS — I110 Hypertensive heart disease with heart failure: Secondary | ICD-10-CM | POA: Insufficient documentation

## 2020-12-05 DIAGNOSIS — I4819 Other persistent atrial fibrillation: Secondary | ICD-10-CM | POA: Diagnosis not present

## 2020-12-05 DIAGNOSIS — Z79899 Other long term (current) drug therapy: Secondary | ICD-10-CM | POA: Diagnosis not present

## 2020-12-05 DIAGNOSIS — F17210 Nicotine dependence, cigarettes, uncomplicated: Secondary | ICD-10-CM | POA: Diagnosis not present

## 2020-12-05 DIAGNOSIS — I2510 Atherosclerotic heart disease of native coronary artery without angina pectoris: Secondary | ICD-10-CM | POA: Diagnosis not present

## 2020-12-05 DIAGNOSIS — Z7901 Long term (current) use of anticoagulants: Secondary | ICD-10-CM | POA: Diagnosis not present

## 2020-12-05 DIAGNOSIS — I252 Old myocardial infarction: Secondary | ICD-10-CM | POA: Insufficient documentation

## 2020-12-05 DIAGNOSIS — I5022 Chronic systolic (congestive) heart failure: Secondary | ICD-10-CM | POA: Insufficient documentation

## 2020-12-05 DIAGNOSIS — I480 Paroxysmal atrial fibrillation: Secondary | ICD-10-CM | POA: Diagnosis not present

## 2020-12-05 DIAGNOSIS — E785 Hyperlipidemia, unspecified: Secondary | ICD-10-CM | POA: Insufficient documentation

## 2020-12-05 MED ORDER — METOPROLOL SUCCINATE ER 100 MG PO TB24
100.0000 mg | ORAL_TABLET | Freq: Two times a day (BID) | ORAL | 3 refills | Status: DC
Start: 2020-12-05 — End: 2021-02-15

## 2020-12-05 NOTE — Patient Instructions (Signed)
Increase metoprolol to 100mg  twice a day   You'll take 50mg  of metoprolol this evening then tomorrow start the metoprolol 100mg  twice a day   Any return of chest pain - return to ER

## 2020-12-05 NOTE — Progress Notes (Signed)
Primary Care Physician: Barbie Banner, MD Referring Physician:[facility] f/u Cardiologist: Dr. York Grice Peter Mclean is a 70 y.o. male with a h/o paroxysmal afib, CAD, tobacco abuse,  s/p ablation by Dr. Johney Frame in the remote past, that was in the hospital  3/16 to 11/30/20 for a NSTEMI, receiving a stent of the LAD. He  presented in afib/flutter with RVR. He was started on amiodarone 200 mg daily (had some elevation of liver enzymes in the past so the loading  dose was kept low). He left the hospital in atrial flutter at 108 bpm.    His EKG reads typical atrial flutter at 146 bpm, Acute Stemi,  but I  feel this is an erroneous reading  from the flutter waves .He  denies chest pain today and feels fine.  I also spoke to Dr. Johney Frame who reviewed the EKG and confirms the readout is erroneous and is secondary to the flutter waves. He is now on triple therapy, asa, plavix and eliquis.   Dr. Johney Frame suggests leaving Amiodarone  at 200 mg qd and increasing the metoprolol to 200 mg daily for better rate control..   Today, he denies symptoms of palpitations, chest pain, shortness of breath, orthopnea, PND, lower extremity edema, dizziness, presyncope, syncope, or neurologic sequela. The patient is tolerating medications without difficulties and is otherwise without complaint today.   Past Medical History:  Diagnosis Date  . Anxiety   . Arthritis   . Chronic systolic CHF (congestive heart failure) (HCC)    a. Dx 09/2013 in setting of AF-RVR; EF 10-15%, possibly tachy-mediated. Cath 09/2013: normal cors. b. TEE: EF 20-25% by echo 10/2013.  Marland Kitchen Dyslipidemia (high LDL; low HDL)   . Elevated liver enzymes   . H/O hematuria   . History of inguinal hernia   . Hypertension   . NICM (nonischemic cardiomyopathy) (HCC)   . Non-compliance    a. Adm 09/2013 after having stopped his meds.  . Paroxysmal atrial fibrillation (HCC)   . Paroxysmal atrial flutter (HCC)    typical appearing  . Pseudoaneurysm following  procedure (HCC)    a. RLE pseudoaneurysm after cath, spontaneously decreased in size, did not require compression.  . Sinus bradycardia    a. HR 40's in ER 11/2013 - digoxin stopped, Coreg decreased.  . Tobacco abuse    Past Surgical History:  Procedure Laterality Date  . ABLATION  06-13-14   PVI and CTI by Dr Johney Frame  . ATRIAL FIBRILLATION ABLATION N/A 06/13/2014   Procedure: ATRIAL FIBRILLATION ABLATION;  Surgeon: Gardiner Rhyme, MD;  Location: [facility] CATH LAB;  Service: Cardiovascular;  Laterality: N/A;  . CARDIOVERSION N/A 10/17/2013   Procedure: CARDIOVERSION;  Surgeon: Thurmon Fair, MD;  Location: [facility] ENDOSCOPY;  Service: Cardiovascular;  Laterality: N/A;  . CHOLECYSTECTOMY    . CORONARY STENT INTERVENTION N/A 11/29/2020   Procedure: CORONARY STENT INTERVENTION;  Surgeon: Runell Gess, MD;  Location: [facility] INVASIVE CV LAB;  Service: Cardiovascular;  Laterality: N/A;  . HERNIA REPAIR  1992 & 2011  . INTRAVASCULAR IMAGING/OCT N/A 11/29/2020   Procedure: INTRAVASCULAR IMAGING/OCT;  Surgeon: Runell Gess, MD;  Location: [facility] INVASIVE CV LAB;  Service: Cardiovascular;  Laterality: N/A;  . LEFT AND RIGHT HEART CATHETERIZATION WITH CORONARY ANGIOGRAM N/A 10/13/2013   Procedure: LEFT AND RIGHT HEART CATHETERIZATION WITH CORONARY ANGIOGRAM;  Surgeon: Marykay Lex, MD;  Location: [facility] CATH LAB;  Service: Cardiovascular;  Laterality: N/A;  . LEFT HEART CATH AND CORONARY ANGIOGRAPHY N/A 11/29/2020  Procedure: LEFT HEART CATH AND CORONARY ANGIOGRAPHY;  Surgeon: Runell Gess, MD;  [facility]: [facility] INVASIVE CV LAB;  Service: Cardiovascular;  Laterality: N/A;  . PSEUDOANERYSM COMPRESSION  11/11/2013   Procedure: PSEUDOANERYSM COMPRESSION;  Surgeon: Pricilla Riffle, MD;  Location: [facility] CATH LAB;  Service: Cardiovascular;;  . TEE WITHOUT CARDIOVERSION N/A 10/17/2013   Procedure: TRANSESOPHAGEAL ECHOCARDIOGRAM (TEE);  Surgeon: Thurmon Fair, MD;  Location: [facility] ENDOSCOPY;  Service: Cardiovascular;  Laterality: N/A;   . TEE WITHOUT CARDIOVERSION N/A 06/12/2014   Procedure: TRANSESOPHAGEAL ECHOCARDIOGRAM (TEE);  Surgeon: Pricilla Riffle, MD;  Location: [facility] ENDOSCOPY;  Service: Cardiovascular;  Laterality: N/A;    Current Outpatient Medications  Medication Sig Dispense Refill  . albuterol (VENTOLIN HFA) 108 (90 Base) MCG/ACT inhaler 1 or 2 puffs up to 4 times a day as needed for wheezing    . ALPRAZolam (XANAX) 1 MG tablet Take 0.5-1 mg by mouth 4 (four) times daily as needed for anxiety or sleep.    Marland Kitchen amiodarone (PACERONE) 200 MG tablet Take 1 tablet (200 mg total) by mouth daily. 90 tablet 3  . aspirin EC 81 MG tablet Take 1 tablet (81 mg total) by mouth daily. Swallow whole. 30 tablet 0  . atorvastatin (LIPITOR) 80 MG tablet Take 1 tablet (80 mg total) by mouth daily. 90 tablet 3  . clopidogrel (PLAVIX) 75 MG tablet Take 1 tablet (75 mg total) by mouth daily with breakfast. 90 tablet 3  . ELIQUIS 5 MG TABS tablet TAKE 1 TABLET BY MOUTH TWICE A DAY 60 tablet 5  . HYDROcodone-acetaminophen (NORCO/VICODIN) 5-325 MG tablet Take 1 tablet by mouth every 6 (six) hours as needed (severe headaches).    . metoprolol succinate (TOPROL-XL) 100 MG 24 hr tablet Take 1.5 tablets (150 mg total) by mouth daily. 45 tablet 3  . gabapentin (NEURONTIN) 300 MG capsule Take 300 mg by mouth 3 (three) times daily. (Patient not taking: Reported on 12/05/2020)    . nitroGLYCERIN (NITROSTAT) 0.4 MG SL tablet Place 1 tablet (0.4 mg total) under the tongue every 5 (five) minutes as needed for chest pain. (Patient not taking: Reported on 12/05/2020) 25 tablet 3   No current facility-administered medications for this encounter.    Allergies  Allergen Reactions  . Tramadol Nausea Only    Social History   Socioeconomic History  . Marital status: Married    Spouse name: Not on file  . Number of children: Not on file  . Years of education: Not on file  . Highest education level: Not on file  Occupational History  . 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 Occupation:    . Occupation:    Tobacco Use  . Smoking status: Current Some Day Smoker    Packs/day: 0.50    Years: 40.00    Pack years: 20.00    Last attempt to quit: 06/07/2014    Years since quitting: 6.5  . Smokeless tobacco: Never Used  . Tobacco comment: Has cut back to 2-3 cigarettes per day.  Substance and Sexual Activity  . Alcohol use: Yes    Alcohol/week: 0.0 standard drinks    Comment: 1-2 A DAY  . Drug use: Not on file  . Sexual activity: Not on file  Other Topics Concern  . Not on file  Social History Narrative   Lives in [place] with wife.    Social Determinants of Health   Financial Resource Strain: Not on file  Food Insecurity:  Not on file  Transportation Needs: Not on file  Physical Activity: Not on file  Stress: Not on file  Social Connections: Not on file  Intimate Partner Violence: Not on file    Family History  Problem Relation Age of Onset  . Hyperlipidemia Mother   . Hypertension Mother   . Heart attack Father     ROS- All systems are reviewed and negative except as per the HPI above  Physical Exam: Vitals:   12/05/20 1104  BP: 132/90  Pulse: (!) 145  Weight: 60.5 kg  Height: 5\' 7"  (1.702 m)   Wt Readings from Last 3 Encounters:  12/05/20 60.5 kg  11/30/20 60.6 kg  10/26/20 61.1 kg    Labs: Lab Results  Component Value Date   NA 139 11/30/2020   K 3.7 11/30/2020   CL 109 11/30/2020   CO2 24 11/30/2020   GLUCOSE 80 11/30/2020   BUN 17 11/30/2020   CREATININE 1.01 11/30/2020   CALCIUM 8.4 (L) 11/30/2020   MG 1.7 11/27/2020   Lab Results  Component Value Date   INR 1.11 07/08/2016   Lab Results  Component Value Date   CHOL 152 11/28/2020   HDL 36 (L) 11/28/2020   LDLCALC 103 (H) 11/28/2020   TRIG 64 11/28/2020     GEN- The patient is well appearing, alert and oriented x 3 today.   Head- normocephalic, atraumatic Eyes-  Sclera clear, conjunctiva  pink Ears- hearing intact Oropharynx- clear Neck- supple, no JVP Lymph- no cervical lymphadenopathy Lungs- Clear to ausculation bilaterally, normal work of breathing Heart- Rapid regular rate and rhythm, no murmurs, rubs or gallops, PMI not laterally displaced GI- soft, NT, ND, + BS Extremities- no clubbing, cyanosis, or edema MS- no significant deformity or atrophy Skin- no rash or lesion Psych- euthymic mood, full affect Neuro- strength and sensation are intact  EKG- Typical atrial flutter at 146 bpm, qrs int 90 ms, qtc 514 ms Auto readout reports ACUTE STEMI. Clinically, pt does not have chest pain and is stable Reviewed with Dr. 11/30/2020 and he feels this is not an acute STEMIS but flutter waves causing the interpretation     Assessment and Plan: 1. Afib/flutter with RVR No chest pt, pt appears stable  I discussed with Dr. Johney Frame with recent NSTEMI and EKG showing acute MI today  Readout thought to be 2/2 Flutter waves  Will continue  amiodarone 200 mg a day and will increase the metoprolol to 200 mg daily Dr. Johney Frame feels he will need CV in the near future and he will look at ablation several months from now  I advised him to go to the ER if any furhter chest pain  2. NSTEMI 11/28/20 Received a stent to proximal LAD Troponin elevation  out of proportion to the RVR on presentation Underwent LHC and received a  stent to  the LAD  Continue asa, plavix,  he will need uninterrupted asa/plavix x 12 months    3. CHA2DS2VASc score of 5 Continue  eliquis 5 mg bid   4. Acute  systolic HF EF was 30-35%, presumably 2/2 tachycardia   5. HTN Stable Losartan was held in the hospital to get BB up titrated   I will see back tomorrow   11/30/20 C[facility]up[facility] [address] [place], [place] [phone]

## 2020-12-06 ENCOUNTER — Telehealth (HOSPITAL_COMMUNITY): Payer: Self-pay | Admitting: *Deleted

## 2020-12-06 ENCOUNTER — Ambulatory Visit (HOSPITAL_COMMUNITY): Payer: Medicare HMO | Admitting: Nurse Practitioner

## 2020-12-06 NOTE — Telephone Encounter (Signed)
Patient's daughter, Trula Ore, called stating patient wanted to cancel appt scheduled for 3/24 for follow up of his elevated HR because he didn't feel like coming. Attempted to reschedule for tomorrow as patients HR was still 143 BP 138/106 when rechecked while I was on the phone with her, Pt (in background of the phone call) adamantly refused stating he had to go out of town tomorrow and would not come in. Informed patient and daughter that Rudi Coco NP strongly advises assessment today at the latest tomorrow with recent MI and continued elevated HRs. Daughter stated he is refusing to come this week. Per Rudi Coco NP pt should increase amiodarone to 200mg  BID continue metoprolol at 100mg  BID and follow up at the latest Monday. Patient agreed to come Monday reluctantly.  ER precautions were reviewed should chest pain, shortness of breath, dizziness or higher heart rates develop. Daughter verbalized agreement of instructions.

## 2020-12-10 ENCOUNTER — Other Ambulatory Visit: Payer: Self-pay

## 2020-12-10 ENCOUNTER — Ambulatory Visit (HOSPITAL_COMMUNITY)
Admission: RE | Admit: 2020-12-10 | Discharge: 2020-12-10 | Disposition: A | Discharge status: Home / Self Care | Payer: Medicare HMO | Source: Ambulatory Visit | Attending: Nurse Practitioner | Admitting: Nurse Practitioner

## 2020-12-10 ENCOUNTER — Encounter (HOSPITAL_COMMUNITY): Payer: Self-pay | Admitting: Nurse Practitioner

## 2020-12-10 VITALS — BP 128/88 | HR 135 | Ht 67.0 in | Wt 137.2 lb

## 2020-12-10 DIAGNOSIS — Z8249 Family history of ischemic heart disease and other diseases of the circulatory system: Secondary | ICD-10-CM | POA: Insufficient documentation

## 2020-12-10 DIAGNOSIS — I5021 Acute systolic (congestive) heart failure: Secondary | ICD-10-CM | POA: Diagnosis not present

## 2020-12-10 DIAGNOSIS — I213 ST elevation (STEMI) myocardial infarction of unspecified site: Secondary | ICD-10-CM | POA: Diagnosis not present

## 2020-12-10 DIAGNOSIS — R000 Tachycardia, unspecified: Secondary | ICD-10-CM | POA: Diagnosis not present

## 2020-12-10 DIAGNOSIS — I4891 Unspecified atrial fibrillation: Secondary | ICD-10-CM | POA: Diagnosis not present

## 2020-12-10 DIAGNOSIS — I4892 Unspecified atrial flutter: Secondary | ICD-10-CM | POA: Diagnosis not present

## 2020-12-10 DIAGNOSIS — Z885 Allergy status to narcotic agent status: Secondary | ICD-10-CM | POA: Insufficient documentation

## 2020-12-10 DIAGNOSIS — F17210 Nicotine dependence, cigarettes, uncomplicated: Secondary | ICD-10-CM | POA: Insufficient documentation

## 2020-12-10 DIAGNOSIS — Z7901 Long term (current) use of anticoagulants: Secondary | ICD-10-CM | POA: Insufficient documentation

## 2020-12-10 DIAGNOSIS — I483 Typical atrial flutter: Secondary | ICD-10-CM

## 2020-12-10 DIAGNOSIS — I110 Hypertensive heart disease with heart failure: Secondary | ICD-10-CM | POA: Insufficient documentation

## 2020-12-10 DIAGNOSIS — Z79899 Other long term (current) drug therapy: Secondary | ICD-10-CM | POA: Diagnosis not present

## 2020-12-10 DIAGNOSIS — D6869 Other thrombophilia: Secondary | ICD-10-CM | POA: Diagnosis not present

## 2020-12-10 LAB — COMPREHENSIVE METABOLIC PANEL
ALT: 28 U/L (ref 0–44)
AST: 25 U/L (ref 15–41)
Albumin: 3.3 g/dL — ABNORMAL LOW (ref 3.5–5.0)
Alkaline Phosphatase: 96 U/L (ref 38–126)
Anion gap: 5 (ref 5–15)
BUN: 16 mg/dL (ref 8–23)
CO2: 31 mmol/L (ref 22–32)
Calcium: 8.8 mg/dL — ABNORMAL LOW (ref 8.9–10.3)
Chloride: 106 mmol/L (ref 98–111)
Creatinine, Ser: 1.11 mg/dL (ref 0.61–1.24)
GFR, Estimated: >60 mL/min (ref >60)
Glucose, Bld: 101 mg/dL — ABNORMAL HIGH (ref 70–99)
Potassium: 3.7 mmol/L (ref 3.5–5.1)
Sodium: 142 mmol/L (ref 135–145)
Total Bilirubin: 0.7 mg/dL (ref 0.3–1.2)
Total Protein: 6.2 g/dL — ABNORMAL LOW (ref 6.5–8.1)

## 2020-12-10 LAB — CBC
HCT: 40.5 % (ref 39.0–52.0)
Hemoglobin: 13 g/dL (ref 13.0–17.0)
MCH: 30.6 pg (ref 26.0–34.0)
MCHC: 32.1 g/dL (ref 30.0–36.0)
MCV: 95.3 fL (ref 80.0–100.0)
Platelets: 212 10*3/uL (ref 150–400)
RBC: 4.25 MIL/uL (ref 4.22–5.81)
RDW: 13.5 % (ref 11.5–15.5)
WBC: 11.3 10*3/uL — ABNORMAL HIGH (ref 4.0–10.5)
nRBC: 0 % (ref 0.0–0.2)

## 2020-12-10 NOTE — H&P (View-Only) (Signed)
Primary Care Physician: Barbie Banner, MD Referring Physician:[facility] f/u Cardiologist: Dr. York Grice Peter Mclean is a 70 y.o. male with a h/o paroxysmal afib, CAD, tobacco abuse,  s/p ablation by Dr. Johney Frame in the remote past, that was in the hospital  3/16 to 11/30/20 for a NSTEMI, receiving a stent of the LAD. He  presented in afib/flutter with RVR. He was started on amiodarone 200 mg daily (had some elevation of liver enzymes in the past so the loading  dose was kept low). He left the hospital in atrial flutter at 108 bpm.  He had an unsuccessful CV in the ER.   His EKG reads typical atrial flutter at 146 bpm, Acute Stemi,  but I  feel this is an erroneous reading  from the flutter waves .He  denies chest pain today and feels fine.  I also spoke to Dr. Johney Frame who reviewed the EKG and confirms the readout is erroneous and is secondary to the flutter waves. He is now on triple therapy, asa, plavix and eliquis.   Dr. Johney Frame suggests leaving Amiodarone  at 200 mg qd and increasing the metoprolol to 200 mg daily for better rate control.  F/u in afib clinic, 12/10/20. Pt was suppose to f/u last Friday, called in and said he could not come. His HR was still running 140 bpm. He is on max BB so decided to increase amiodarone to 200 mg bid. He today still shows STEMI( erroneous auto read)  with atrial flutter at 135 bpm. He states that he feels fine. I am concerned re the strain on his heart with RVR with a recent NSTEMI  and new stent of the LAD. I discussed proceeding with cardioversion and he is in agreement  Today, he denies symptoms of palpitations, chest pain, shortness of breath, orthopnea, PND, lower extremity edema, dizziness, presyncope, syncope, or neurologic sequela. The patient is tolerating medications without difficulties and is otherwise without complaint today.   Past Medical History:  Diagnosis Date  . Anxiety   . Arthritis   . Chronic systolic CHF (congestive heart failure)  (HCC)    a. Dx 09/2013 in setting of AF-RVR; EF 10-15%, possibly tachy-mediated. Cath 09/2013: normal cors. b. TEE: EF 20-25% by echo 10/2013.  Marland Kitchen Dyslipidemia (high LDL; low HDL)   . Elevated liver enzymes   . H/O hematuria   . History of inguinal hernia   . Hypertension   . NICM (nonischemic cardiomyopathy) (HCC)   . Non-compliance    a. Adm 09/2013 after having stopped his meds.  . Paroxysmal atrial fibrillation (HCC)   . Paroxysmal atrial flutter (HCC)    typical appearing  . Pseudoaneurysm following procedure (HCC)    a. RLE pseudoaneurysm after cath, spontaneously decreased in size, did not require compression.  . Sinus bradycardia    a. HR 40's in ER 11/2013 - digoxin stopped, Coreg decreased.  . Tobacco abuse    Past Surgical History:  Procedure Laterality Date  . ABLATION  06-13-14   PVI and CTI by Dr Johney Frame  . ATRIAL FIBRILLATION ABLATION N/A 06/13/2014   Procedure: ATRIAL FIBRILLATION ABLATION;  Surgeon: Gardiner Rhyme, MD;  Location: [facility] CATH LAB;  Service: Cardiovascular;  Laterality: N/A;  . CARDIOVERSION N/A 10/17/2013   Procedure: CARDIOVERSION;  Surgeon: Thurmon Fair, MD;  Location: [facility] ENDOSCOPY;  Service: Cardiovascular;  Laterality: N/A;  . CHOLECYSTECTOMY    . CORONARY STENT INTERVENTION N/A 11/29/2020   Procedure: CORONARY STENT INTERVENTION;  Surgeon: [name]  J, MD;  Location: [facility] INVASIVE CV LAB;  Service: Cardiovascular;  Laterality: N/A;  . HERNIA REPAIR  1992 & 2011  . INTRAVASCULAR IMAGING/OCT N/A 11/29/2020   Procedure: INTRAVASCULAR IMAGING/OCT;  Surgeon: Runell Gess, MD;  Location: [facility] INVASIVE CV LAB;  Service: Cardiovascular;  Laterality: N/A;  . LEFT AND RIGHT HEART CATHETERIZATION WITH CORONARY ANGIOGRAM N/A 10/13/2013   Procedure: LEFT AND RIGHT HEART CATHETERIZATION WITH CORONARY ANGIOGRAM;  Surgeon: Marykay Lex, MD;  Location: [facility] CATH LAB;  Service: Cardiovascular;  Laterality: N/A;  . LEFT HEART CATH AND CORONARY ANGIOGRAPHY N/A 11/29/2020    Procedure: LEFT HEART CATH AND CORONARY ANGIOGRAPHY;  Surgeon: Runell Gess, MD;  Location: [facility] INVASIVE CV LAB;  Service: Cardiovascular;  Laterality: N/A;  . PSEUDOANERYSM COMPRESSION  11/11/2013   Procedure: PSEUDOANERYSM COMPRESSION;  Surgeon: Pricilla Riffle, MD;  Location: [facility] CATH LAB;  Service: Cardiovascular;;  . TEE WITHOUT CARDIOVERSION N/A 10/17/2013   Procedure: TRANSESOPHAGEAL ECHOCARDIOGRAM (TEE);  Surgeon: Thurmon Fair, MD;  Location: [facility] ENDOSCOPY;  Service: Cardiovascular;  Laterality: N/A;  . TEE WITHOUT CARDIOVERSION N/A 06/12/2014   Procedure: TRANSESOPHAGEAL ECHOCARDIOGRAM (TEE);  Surgeon: Pricilla Riffle, MD;  Location: [facility] ENDOSCOPY;  Service: Cardiovascular;  Laterality: N/A;    Current Outpatient Medications  Medication Sig Dispense Refill  . albuterol (VENTOLIN HFA) 108 (90 Base) MCG/ACT inhaler 1 or 2 puffs up to 4 times a day as needed for wheezing    . ALPRAZolam (XANAX) 1 MG tablet Take 0.5-1 mg by mouth 4 (four) times daily as needed for anxiety or sleep.    Marland Kitchen amiodarone (PACERONE) 200 MG tablet Take 1 tablet (200 mg total) by mouth daily. (Patient taking differently: Take 200 mg by mouth 2 (two) times daily.) 90 tablet 3  . aspirin EC 81 MG tablet Take 1 tablet (81 mg total) by mouth daily. Swallow whole. 30 tablet 0  . atorvastatin (LIPITOR) 80 MG tablet Take 1 tablet (80 mg total) by mouth daily. 90 tablet 3  . clopidogrel (PLAVIX) 75 MG tablet Take 1 tablet (75 mg total) by mouth daily with breakfast. 90 tablet 3  . ELIQUIS 5 MG TABS tablet TAKE 1 TABLET BY MOUTH TWICE A DAY 60 tablet 5  . gabapentin (NEURONTIN) 300 MG capsule Take 300 mg by mouth 3 (three) times daily.    Marland Kitchen HYDROcodone-acetaminophen (NORCO/VICODIN) 5-325 MG tablet Take 1 tablet by mouth every 6 (six) hours as needed (severe headaches).    . metoprolol succinate (TOPROL-XL) 100 MG 24 hr tablet Take 1 tablet (100 mg total) by mouth in the morning and at bedtime. 45 tablet 3  . nitroGLYCERIN  (NITROSTAT) 0.4 MG SL tablet Place 1 tablet (0.4 mg total) under the tongue every 5 (five) minutes as needed for chest pain. 25 tablet 3   No current facility-administered medications for this encounter.    Allergies  Allergen Reactions  . Tramadol Nausea Only    Social History   Socioeconomic History  . Marital status: Married    Spouse name: Not on file  . Number of children: Not on file  . Years of education: Not on file  . Highest education level: Not on file  Occupational History  . 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 Occupation:    . Occupation:    Tobacco Use  . Smoking status: Current Some Day Smoker    Packs/day: 0.50    Years: 40.00    Pack years: 20.00  Last attempt to quit: 06/07/2014    Years since quitting: 6.5  . Smokeless tobacco: Never Used  . Tobacco comment: Has cut back to 2-3 cigarettes per day.  Substance and Sexual Activity  . Alcohol use: Yes    Alcohol/week: 0.0 standard drinks    Comment: 1-2 A DAY  . Drug use: Not on file  . Sexual activity: Not on file  Other Topics Concern  . Not on file  Social History Narrative   Lives in [place] with wife.    Social Determinants of Health   Financial Resource Strain: Not on file  Food Insecurity: Not on file  Transportation Needs: Not on file  Physical Activity: Not on file  Stress: Not on file  Social Connections: Not on file  Intimate Partner Violence: Not on file    Family History  Problem Relation Age of Onset  . Hyperlipidemia Mother   . Hypertension Mother   . Heart attack Father     ROS- All systems are reviewed and negative except as per the HPI above  Physical Exam: Vitals:   12/10/20 1503  BP: 128/88  Pulse: (!) 135  Weight: 62.2 kg  Height: 5\' 7"  (1.702 m)   Wt Readings from Last 3 Encounters:  12/10/20 62.2 kg  12/05/20 60.5 kg  11/30/20 60.6 kg    Labs: Lab Results  Component Value Date   NA 139 11/30/2020   K 3.7  11/30/2020   CL 109 11/30/2020   CO2 24 11/30/2020   GLUCOSE 80 11/30/2020   BUN 17 11/30/2020   CREATININE 1.01 11/30/2020   CALCIUM 8.4 (L) 11/30/2020   MG 1.7 11/27/2020   Lab Results  Component Value Date   INR 1.11 07/08/2016   Lab Results  Component Value Date   CHOL 152 11/28/2020   HDL 36 (L) 11/28/2020   LDLCALC 103 (H) 11/28/2020   TRIG 64 11/28/2020     GEN- The patient is well appearing, alert and oriented x 3 today.   Head- normocephalic, atraumatic Eyes-  Sclera clear, conjunctiva pink Ears- hearing intact Oropharynx- clear Neck- supple, no JVP Lymph- no cervical lymphadenopathy Lungs- Clear to ausculation bilaterally, normal work of breathing Heart- Rapid regular rate and rhythm, no murmurs, rubs or gallops, PMI not laterally displaced GI- soft, NT, ND, + BS Extremities- no clubbing, cyanosis, or edema MS- no significant deformity or atrophy Skin- no rash or lesion Psych- euthymic mood, full affect Neuro- strength and sensation are intact  EKG- Typical atrial flutter at 135 bpm, qrs int 96 ms, qtc 432 ms Auto readout reports ACUTE STEMI. Clinically, pt does not have chest pain and is stable Reviewed with Dr. 11/30/2020 on previous EKG read out that was the same  and he feels this is not an acute STEMIS but flutter waves causing the interpretation     Assessment and Plan: 1. Afib/flutter with RVR No chest pt, pt appears stable  I discussed with Dr. Johney Frame with recent NSTEMI and EKG showing acute MI on last visit , thought to be 2/2 Flutter waves  Increased amio to 200 mg bid last Friday for rapid v rates that were not responding to increased dose of  Metoprolol at 200 mg bid   Will go ahead and schedule for CV in the next week  Dr. Sunday feels he may need another ablation,  he will look at this  several months from now, he wants him to recuperate from the recent NSTEMI   I advised him to  go to the ER if any furhter chest pain He will lower amio to one a  day at time of CV and resume his previous BB dose at time of CV Cmet/cbc/covid testing   2. NSTEMI 11/28/20 Received a stent to proximal LAD Troponin elevation  out of proportion to the RVR on presentation Underwent LHC and received a  stent to  the LAD  Continue asa, plavix,  he will need uninterrupted asa/plavix x 12 months    3. CHA2DS2VASc score of 5 Continue  eliquis 5 mg bid  No missed doses for at least 3 weeks   4. Acute  systolic HF EF was 30-35%, presumably 2/2 tachycardia   5. HTN Stable Losartan was held in the hospital to get BB up titrated   I will see back one week after CV  Latonja Bobeck C. Matthew Folks [facility] [address] [place], [place] [address] [phone]

## 2020-12-10 NOTE — Addendum Note (Signed)
Encounter addended by: Shona Simpson, RN on: 12/10/2020 4:03 PM  Actions taken: Order list changed

## 2020-12-10 NOTE — Progress Notes (Signed)
Primary Care Physician: Barbie Banner, MD Referring Physician:[facility] f/u Cardiologist: Dr. York Grice Peter Mclean is a 70 y.o. male with a h/o paroxysmal afib, CAD, tobacco abuse,  s/p ablation by Dr. Johney Frame in the remote past, that was in the hospital  3/16 to 11/30/20 for a NSTEMI, receiving a stent of the LAD. He  presented in afib/flutter with RVR. He was started on amiodarone 200 mg daily (had some elevation of liver enzymes in the past so the loading  dose was kept low). He left the hospital in atrial flutter at 108 bpm.  He had an unsuccessful CV in the ER.   His EKG reads typical atrial flutter at 146 bpm, Acute Stemi,  but I  feel this is an erroneous reading  from the flutter waves .He  denies chest pain today and feels fine.  I also spoke to Dr. Johney Frame who reviewed the EKG and confirms the readout is erroneous and is secondary to the flutter waves. He is now on triple therapy, asa, plavix and eliquis.   Dr. Johney Frame suggests leaving Amiodarone  at 200 mg qd and increasing the metoprolol to 200 mg daily for better rate control.  F/u in afib clinic, 12/10/20. Pt was suppose to f/u last Friday, called in and said he could not come. His HR was still running 140 bpm. He is on max BB so decided to increase amiodarone to 200 mg bid. He today still shows STEMI( erroneous auto read)  with atrial flutter at 135 bpm. He states that he feels fine. I am concerned re the strain on his heart with RVR with a recent NSTEMI  and new stent of the LAD. I discussed proceeding with cardioversion and he is in agreement  Today, he denies symptoms of palpitations, chest pain, shortness of breath, orthopnea, PND, lower extremity edema, dizziness, presyncope, syncope, or neurologic sequela. The patient is tolerating medications without difficulties and is otherwise without complaint today.   Past Medical History:  Diagnosis Date  . Anxiety   . Arthritis   . Chronic systolic CHF (congestive heart failure)  (HCC)    a. Dx 09/2013 in setting of AF-RVR; EF 10-15%, possibly tachy-mediated. Cath 09/2013: normal cors. b. TEE: EF 20-25% by echo 10/2013.  Marland Kitchen Dyslipidemia (high LDL; low HDL)   . Elevated liver enzymes   . H/O hematuria   . History of inguinal hernia   . Hypertension   . NICM (nonischemic cardiomyopathy) (HCC)   . Non-compliance    a. Adm 09/2013 after having stopped his meds.  . Paroxysmal atrial fibrillation (HCC)   . Paroxysmal atrial flutter (HCC)    typical appearing  . Pseudoaneurysm following procedure (HCC)    a. RLE pseudoaneurysm after cath, spontaneously decreased in size, did not require compression.  . Sinus bradycardia    a. HR 40's in ER 11/2013 - digoxin stopped, Coreg decreased.  . Tobacco abuse    Past Surgical History:  Procedure Laterality Date  . ABLATION  06-13-14   PVI and CTI by Dr Johney Frame  . ATRIAL FIBRILLATION ABLATION N/A 06/13/2014   Procedure: ATRIAL FIBRILLATION ABLATION;  Surgeon: Gardiner Rhyme, MD;  Location: [facility] CATH LAB;  Service: Cardiovascular;  Laterality: N/A;  . CARDIOVERSION N/A 10/17/2013   Procedure: CARDIOVERSION;  Surgeon: Thurmon Fair, MD;  Location: [facility] ENDOSCOPY;  Service: Cardiovascular;  Laterality: N/A;  . CHOLECYSTECTOMY    . CORONARY STENT INTERVENTION N/A 11/29/2020   Procedure: CORONARY STENT INTERVENTION;  Surgeon: [name]  J, MD;  Location: [facility] INVASIVE CV LAB;  Service: Cardiovascular;  Laterality: N/A;  . HERNIA REPAIR  1992 & 2011  . INTRAVASCULAR IMAGING/OCT N/A 11/29/2020   Procedure: INTRAVASCULAR IMAGING/OCT;  Surgeon: Runell Gess, MD;  Location: [facility] INVASIVE CV LAB;  Service: Cardiovascular;  Laterality: N/A;  . LEFT AND RIGHT HEART CATHETERIZATION WITH CORONARY ANGIOGRAM N/A 10/13/2013   Procedure: LEFT AND RIGHT HEART CATHETERIZATION WITH CORONARY ANGIOGRAM;  Surgeon: Marykay Lex, MD;  Location: [facility] CATH LAB;  Service: Cardiovascular;  Laterality: N/A;  . LEFT HEART CATH AND CORONARY ANGIOGRAPHY N/A 11/29/2020    Procedure: LEFT HEART CATH AND CORONARY ANGIOGRAPHY;  Surgeon: Runell Gess, MD;  Location: [facility] INVASIVE CV LAB;  Service: Cardiovascular;  Laterality: N/A;  . PSEUDOANERYSM COMPRESSION  11/11/2013   Procedure: PSEUDOANERYSM COMPRESSION;  Surgeon: Pricilla Riffle, MD;  Location: [facility] CATH LAB;  Service: Cardiovascular;;  . TEE WITHOUT CARDIOVERSION N/A 10/17/2013   Procedure: TRANSESOPHAGEAL ECHOCARDIOGRAM (TEE);  Surgeon: Thurmon Fair, MD;  Location: [facility] ENDOSCOPY;  Service: Cardiovascular;  Laterality: N/A;  . TEE WITHOUT CARDIOVERSION N/A 06/12/2014   Procedure: TRANSESOPHAGEAL ECHOCARDIOGRAM (TEE);  Surgeon: Pricilla Riffle, MD;  Location: [facility] ENDOSCOPY;  Service: Cardiovascular;  Laterality: N/A;    Current Outpatient Medications  Medication Sig Dispense Refill  . albuterol (VENTOLIN HFA) 108 (90 Base) MCG/ACT inhaler 1 or 2 puffs up to 4 times a day as needed for wheezing    . ALPRAZolam (XANAX) 1 MG tablet Take 0.5-1 mg by mouth 4 (four) times daily as needed for anxiety or sleep.    Marland Kitchen amiodarone (PACERONE) 200 MG tablet Take 1 tablet (200 mg total) by mouth daily. (Patient taking differently: Take 200 mg by mouth 2 (two) times daily.) 90 tablet 3  . aspirin EC 81 MG tablet Take 1 tablet (81 mg total) by mouth daily. Swallow whole. 30 tablet 0  . atorvastatin (LIPITOR) 80 MG tablet Take 1 tablet (80 mg total) by mouth daily. 90 tablet 3  . clopidogrel (PLAVIX) 75 MG tablet Take 1 tablet (75 mg total) by mouth daily with breakfast. 90 tablet 3  . ELIQUIS 5 MG TABS tablet TAKE 1 TABLET BY MOUTH TWICE A DAY 60 tablet 5  . gabapentin (NEURONTIN) 300 MG capsule Take 300 mg by mouth 3 (three) times daily.    Marland Kitchen HYDROcodone-acetaminophen (NORCO/VICODIN) 5-325 MG tablet Take 1 tablet by mouth every 6 (six) hours as needed (severe headaches).    . metoprolol succinate (TOPROL-XL) 100 MG 24 hr tablet Take 1 tablet (100 mg total) by mouth in the morning and at bedtime. 45 tablet 3  . nitroGLYCERIN  (NITROSTAT) 0.4 MG SL tablet Place 1 tablet (0.4 mg total) under the tongue every 5 (five) minutes as needed for chest pain. 25 tablet 3   No current facility-administered medications for this encounter.    Allergies  Allergen Reactions  . Tramadol Nausea Only    Social History   Socioeconomic History  . Marital status: Married    Spouse name: Not on file  . Number of children: Not on file  . Years of education: Not on file  . Highest education level: Not on file  Occupational History  . 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 Occupation:    . Occupation:    Tobacco Use  . Smoking status: Current Some Day Smoker    Packs/day: 0.50    Years: 40.00    Pack years: 20.00  Last attempt to quit: 06/07/2014    Years since quitting: 6.5  . Smokeless tobacco: Never Used  . Tobacco comment: Has cut back to 2-3 cigarettes per day.  Substance and Sexual Activity  . Alcohol use: Yes    Alcohol/week: 0.0 standard drinks    Comment: 1-2 A DAY  . Drug use: Not on file  . Sexual activity: Not on file  Other Topics Concern  . Not on file  Social History Narrative   Lives in [place] with wife.    Social Determinants of Health   Financial Resource Strain: Not on file  Food Insecurity: Not on file  Transportation Needs: Not on file  Physical Activity: Not on file  Stress: Not on file  Social Connections: Not on file  Intimate Partner Violence: Not on file    Family History  Problem Relation Age of Onset  . Hyperlipidemia Mother   . Hypertension Mother   . Heart attack Father     ROS- All systems are reviewed and negative except as per the HPI above  Physical Exam: Vitals:   12/10/20 1503  BP: 128/88  Pulse: (!) 135  Weight: 62.2 kg  Height: 5\' 7"  (1.702 m)   Wt Readings from Last 3 Encounters:  12/10/20 62.2 kg  12/05/20 60.5 kg  11/30/20 60.6 kg    Labs: Lab Results  Component Value Date   NA 139 11/30/2020   K 3.7  11/30/2020   CL 109 11/30/2020   CO2 24 11/30/2020   GLUCOSE 80 11/30/2020   BUN 17 11/30/2020   CREATININE 1.01 11/30/2020   CALCIUM 8.4 (L) 11/30/2020   MG 1.7 11/27/2020   Lab Results  Component Value Date   INR 1.11 07/08/2016   Lab Results  Component Value Date   CHOL 152 11/28/2020   HDL 36 (L) 11/28/2020   LDLCALC 103 (H) 11/28/2020   TRIG 64 11/28/2020     GEN- The patient is well appearing, alert and oriented x 3 today.   Head- normocephalic, atraumatic Eyes-  Sclera clear, conjunctiva pink Ears- hearing intact Oropharynx- clear Neck- supple, no JVP Lymph- no cervical lymphadenopathy Lungs- Clear to ausculation bilaterally, normal work of breathing Heart- Rapid regular rate and rhythm, no murmurs, rubs or gallops, PMI not laterally displaced GI- soft, NT, ND, + BS Extremities- no clubbing, cyanosis, or edema MS- no significant deformity or atrophy Skin- no rash or lesion Psych- euthymic mood, full affect Neuro- strength and sensation are intact  EKG- Typical atrial flutter at 135 bpm, qrs int 96 ms, qtc 432 ms Auto readout reports ACUTE STEMI. Clinically, pt does not have chest pain and is stable Reviewed with Dr. 11/30/2020 on previous EKG read out that was the same  and he feels this is not an acute STEMIS but flutter waves causing the interpretation     Assessment and Plan: 1. Afib/flutter with RVR No chest pt, pt appears stable  I discussed with Dr. Johney Frame with recent NSTEMI and EKG showing acute MI on last visit , thought to be 2/2 Flutter waves  Increased amio to 200 mg bid last Friday for rapid v rates that were not responding to increased dose of  Metoprolol at 200 mg bid   Will go ahead and schedule for CV in the next week  Dr. Sunday feels he may need another ablation,  he will look at this  several months from now, he wants him to recuperate from the recent NSTEMI   I advised him to  go to the ER if any furhter chest pain He will lower amio to one a  day at time of CV and resume his previous BB dose at time of CV Cmet/cbc/covid testing   2. NSTEMI 11/28/20 Received a stent to proximal LAD Troponin elevation  out of proportion to the RVR on presentation Underwent LHC and received a  stent to  the LAD  Continue asa, plavix,  he will need uninterrupted asa/plavix x 12 months    3. CHA2DS2VASc score of 5 Continue  eliquis 5 mg bid  No missed doses for at least 3 weeks   4. Acute  systolic HF EF was 30-35%, presumably 2/2 tachycardia   5. HTN Stable Losartan was held in the hospital to get BB up titrated   I will see back one week after CV  Dawid Dupriest C. Nayda Riesen, ANP-C Afib Clinic [facility] [address] [place], [place] [address] [phone]  

## 2020-12-10 NOTE — Patient Instructions (Signed)
Cardioversion scheduled for Thursday, March 31st  - Arrive at the [facility] and go to admitting at [facility]  - Do not eat or drink anything after midnight the night prior to your procedure.  - Take all your morning medication (except diabetic medications) with a sip of water prior to arrival.  - You will not be able to drive home after your procedure.  - Do NOT miss any doses of your blood thinner - if you should miss a dose please notify our office immediately.  - If you feel as if you go back into normal rhythm prior to scheduled cardioversion, please notify our office immediately. If your procedure is canceled in the cardioversion suite you will be charged a cancellation fee.  Day of cardioversion go back to Amiodarone to 200mg  once a day and back to normal dosing of metoprolol.

## 2020-12-11 ENCOUNTER — Other Ambulatory Visit (HOSPITAL_COMMUNITY)
Admission: RE | Admit: 2020-12-11 | Discharge: 2020-12-11 | Disposition: A | Discharge status: Home / Self Care | Payer: Medicare HMO | Source: Ambulatory Visit | Attending: Internal Medicine | Admitting: Internal Medicine

## 2020-12-11 DIAGNOSIS — Z01812 Encounter for preprocedural laboratory examination: Secondary | ICD-10-CM | POA: Insufficient documentation

## 2020-12-11 DIAGNOSIS — Z20822 Contact with and (suspected) exposure to covid-19: Secondary | ICD-10-CM | POA: Diagnosis not present

## 2020-12-11 LAB — SARS CORONAVIRUS 2 (TAT 6-24 HRS): SARS Coronavirus 2: NEGATIVE

## 2020-12-13 ENCOUNTER — Encounter (HOSPITAL_COMMUNITY): Payer: Self-pay | Admitting: Internal Medicine

## 2020-12-13 ENCOUNTER — Ambulatory Visit (HOSPITAL_COMMUNITY): Payer: Medicare HMO | Admitting: Anesthesiology

## 2020-12-13 ENCOUNTER — Ambulatory Visit (HOSPITAL_COMMUNITY)
Admission: RE | Admit: 2020-12-13 | Discharge: 2020-12-13 | Disposition: A | Discharge status: Home / Self Care | Payer: Medicare HMO | Attending: Internal Medicine | Admitting: Internal Medicine

## 2020-12-13 ENCOUNTER — Encounter (HOSPITAL_COMMUNITY)
Admission: RE | Disposition: A | Discharge status: Home / Self Care | Payer: Self-pay | Source: Home / Self Care | Attending: Internal Medicine

## 2020-12-13 ENCOUNTER — Other Ambulatory Visit: Payer: Self-pay

## 2020-12-13 DIAGNOSIS — Z8249 Family history of ischemic heart disease and other diseases of the circulatory system: Secondary | ICD-10-CM | POA: Diagnosis not present

## 2020-12-13 DIAGNOSIS — F17210 Nicotine dependence, cigarettes, uncomplicated: Secondary | ICD-10-CM | POA: Insufficient documentation

## 2020-12-13 DIAGNOSIS — Z7902 Long term (current) use of antithrombotics/antiplatelets: Secondary | ICD-10-CM | POA: Diagnosis not present

## 2020-12-13 DIAGNOSIS — I214 Non-ST elevation (NSTEMI) myocardial infarction: Secondary | ICD-10-CM | POA: Insufficient documentation

## 2020-12-13 DIAGNOSIS — Z7982 Long term (current) use of aspirin: Secondary | ICD-10-CM | POA: Insufficient documentation

## 2020-12-13 DIAGNOSIS — I110 Hypertensive heart disease with heart failure: Secondary | ICD-10-CM | POA: Diagnosis not present

## 2020-12-13 DIAGNOSIS — Z9049 Acquired absence of other specified parts of digestive tract: Secondary | ICD-10-CM | POA: Insufficient documentation

## 2020-12-13 DIAGNOSIS — I4891 Unspecified atrial fibrillation: Secondary | ICD-10-CM

## 2020-12-13 DIAGNOSIS — Z79899 Other long term (current) drug therapy: Secondary | ICD-10-CM | POA: Diagnosis not present

## 2020-12-13 DIAGNOSIS — Z7901 Long term (current) use of anticoagulants: Secondary | ICD-10-CM | POA: Diagnosis not present

## 2020-12-13 DIAGNOSIS — E785 Hyperlipidemia, unspecified: Secondary | ICD-10-CM | POA: Insufficient documentation

## 2020-12-13 DIAGNOSIS — I4892 Unspecified atrial flutter: Secondary | ICD-10-CM | POA: Insufficient documentation

## 2020-12-13 DIAGNOSIS — I480 Paroxysmal atrial fibrillation: Secondary | ICD-10-CM | POA: Diagnosis present

## 2020-12-13 DIAGNOSIS — Z885 Allergy status to narcotic agent status: Secondary | ICD-10-CM | POA: Diagnosis not present

## 2020-12-13 DIAGNOSIS — I5021 Acute systolic (congestive) heart failure: Secondary | ICD-10-CM | POA: Insufficient documentation

## 2020-12-13 DIAGNOSIS — I2510 Atherosclerotic heart disease of native coronary artery without angina pectoris: Secondary | ICD-10-CM | POA: Diagnosis not present

## 2020-12-13 DIAGNOSIS — I428 Other cardiomyopathies: Secondary | ICD-10-CM | POA: Diagnosis not present

## 2020-12-13 DIAGNOSIS — Z955 Presence of coronary angioplasty implant and graft: Secondary | ICD-10-CM | POA: Insufficient documentation

## 2020-12-13 HISTORY — PX: CARDIOVERSION: SHX1299

## 2020-12-13 SURGERY — CARDIOVERSION
Anesthesia: General

## 2020-12-13 MED ORDER — SODIUM CHLORIDE 0.9 % IV SOLN: INTRAVENOUS | Status: DC | PRN

## 2020-12-13 MED ORDER — LIDOCAINE 2% (20 MG/ML) 5 ML SYRINGE
INTRAMUSCULAR | Status: DC | PRN
  Administered 2020-12-13: 50 mg via INTRAVENOUS

## 2020-12-13 MED ORDER — ETOMIDATE 2 MG/ML IV SOLN
INTRAVENOUS | Status: DC | PRN
  Administered 2020-12-13: 14 mg via INTRAVENOUS

## 2020-12-13 NOTE — Anesthesia Procedure Notes (Signed)
Procedure Name: General with mask airway Date/Time: 12/13/2020 11:52 AM Performed by: Elliot Dally, CRNA Pre-anesthesia Checklist: Patient identified, Emergency Drugs available, Suction available, Patient being monitored and Timeout performed Patient Re-evaluated:Patient Re-evaluated prior to induction Oxygen Delivery Method: Ambu bag

## 2020-12-13 NOTE — Anesthesia Postprocedure Evaluation (Signed)
Anesthesia Post Note  Patient: Peter Mclean  Procedure(s) Performed: CARDIOVERSION (N/A )     Patient location during evaluation: PACU Anesthesia Type: General Level of consciousness: awake and alert and oriented Pain management: pain level controlled Vital Signs Assessment: post-procedure vital signs reviewed and stable Respiratory status: spontaneous breathing, nonlabored ventilation and respiratory function stable Cardiovascular status: blood pressure returned to baseline and stable Postop Assessment: no apparent nausea or vomiting Anesthetic complications: no   No complications documented.  Last Vitals:  Vitals:   12/13/20 1208 12/13/20 1216  BP: (!) 170/84   Pulse: 98 96  Resp: 20 (!) 23  Temp:    SpO2: 98% 97%    Last Pain:  Vitals:   12/13/20 1208  TempSrc:   PainSc: 0-No pain                 Gerlene Glassburn A.

## 2020-12-13 NOTE — CV Procedure (Signed)
CARDIOVERSION  Patient sedated by anesthesia with 14 mg Etomidate and 50 mg lidocaine intravenously  With pads in AP position, cardioversion attempted with 200 J synchronized biphasic energy  Unsuccessful With pads in AP position, cardioversion attempted with 200 J synchronized biphasic energy PLUS external chest compression   Unsuccessful  New pads placed in the apex/base position.  Again, attempt with 200 J synchronized biphasic energy delivered  Unsuccessful.  Procedure without complications.  [name] MD

## 2020-12-13 NOTE — Interval H&P Note (Signed)
History and Physical Interval Note:  12/13/2020 11:41 AM  Peter Mclean  has presented today for surgery, with the diagnosis of AFIB.  The various methods of treatment have been discussed with the patient and family. After consideration of risks, benefits and other options for treatment, the patient has consented to  Procedure(s): CARDIOVERSION (N/A) as a surgical intervention.  The patient's history has been reviewed, patient examined, no change in status, stable for surgery.  I have reviewed the patient's chart and labs.  Questions were answered to the patient's satisfaction.     [name]

## 2020-12-13 NOTE — Anesthesia Preprocedure Evaluation (Addendum)
Anesthesia Evaluation  Patient identified by MRN, date of birth, ID band Patient awake    Reviewed: Allergy & Precautions, NPO status , Patient's Chart, lab work & pertinent test results, reviewed documented beta blocker date and time   Airway Mallampati: II  TM Distance: >3 FB Neck ROM: Full    Dental  (+) Upper Dentures, Lower Dentures   Pulmonary Current Smoker,    Pulmonary exam normal breath sounds clear to auscultation + decreased breath sounds      Cardiovascular hypertension, Pt. on medications and Pt. on home beta blockers + Past MI and +CHF  + dysrhythmias Atrial Fibrillation  Rhythm:Irregular Rate:Tachycardia  Non STEMI 11/30/20  EKG 12/10/20 Sinus rhythm, incomplete RBBB, inferior infarct  Echo 11/29/20 1. Left ventricular ejection fraction, by estimation, is 30 to 35%. The left ventricle has moderately decreased function. The left ventricle demonstrates global hypokinesis. Left ventricular diastolic function could  not be evaluated. There is of the left ventricular,.  2. Right ventricular systolic function is normal. The right ventricular size is normal.  3. The mitral valve is normal in structure. Mild mitral valve  regurgitation.  4. The aortic valve is normal in structure. Aortic valve regurgitation is not visualized. No aortic stenosis is present.   Cardiac Cath 11/29/20  Mid LAD lesion is 99% stenosed.  Prox LAD to Mid LAD lesion is 50% stenosed.  Post intervention, there is a 0% residual stenosis.  Post intervention, there is a 0% residual stenosis.  A drug-eluting stent was successfully placed using a STENT RESOLUTE ONYX 3.0X30.  There is mild to moderate left ventricular systolic dysfunction.  The left ventricular ejection fraction is 45-50% by visual estimate.  LV end diastolic pressure is normal.     Neuro/Psych Anxiety negative neurological ROS     GI/Hepatic negative GI ROS, Neg liver  ROS,   Endo/Other  Hyperlipidemia  Renal/GU negative Renal ROS  negative genitourinary   Musculoskeletal  (+) Arthritis , Osteoarthritis,    Abdominal   Peds  Hematology Plavix therapy- last dose this am Eliquis therapy- last dose this am   Anesthesia Other Findings   Reproductive/Obstetrics                            Anesthesia Physical Anesthesia Plan  ASA: III  Anesthesia Plan: General   Post-op Pain Management:    Induction: Intravenous  PONV Risk Score and Plan: 1 and Treatment may vary due to age or medical condition and Propofol infusion  Airway Management Planned: Natural Airway and Mask  Additional Equipment:   Intra-op Plan:   Post-operative Plan:   Informed Consent: I have reviewed the patients History and Physical, chart, labs and discussed the procedure including the risks, benefits and alternatives for the proposed anesthesia with the patient or authorized representative who has indicated his/her understanding and acceptance.       Plan Discussed with: CRNA and Anesthesiologist  Anesthesia Plan Comments:         Anesthesia Quick Evaluation

## 2020-12-13 NOTE — Discharge Instructions (Signed)
Electrical Cardioversion Electrical cardioversion is the delivery of a jolt of electricity to restore a normal rhythm to the heart. A rhythm that is too fast or is not regular keeps the heart from pumping well. In this procedure, sticky patches or metal paddles are placed on the chest to deliver electricity to the heart from a device. This procedure may be done in an emergency if:  There is low or no blood pressure as a result of the heart rhythm.  Normal rhythm must be restored as fast as possible to protect the brain and heart from further damage.  It may save a life. This may also be a scheduled procedure for irregular or fast heart rhythms that are not immediately life-threatening. Tell a health care provider about:  Any allergies you have.  All medicines you are taking, including vitamins, herbs, eye drops, creams, and over-the-counter medicines.  Any problems you or family members have had with anesthetic medicines.  Any blood disorders you have.  Any surgeries you have had.  Any medical conditions you have.  Whether you are pregnant or may be pregnant. What are the risks? Generally, this is a safe procedure. However, problems may occur, including:  Allergic reactions to medicines.  A blood clot that breaks free and travels to other parts of your body.  The possible return of an abnormal heart rhythm within hours or days after the procedure.  Your heart stopping (cardiac arrest). This is rare. What happens before the procedure? Medicines  Your health care provider may have you start taking: ? Blood-thinning medicines (anticoagulants) so your blood does not clot as easily. ? Medicines to help stabilize your heart rate and rhythm.  Ask your health care provider about: ? Changing or stopping your regular medicines. This is especially important if you are taking diabetes medicines or blood thinners. ? Taking medicines such as aspirin and ibuprofen. These medicines can  thin your blood. Do not take these medicines unless your health care provider tells you to take them. ? Taking over-the-counter medicines, vitamins, herbs, and supplements. General instructions  Follow instructions from your health care provider about eating or drinking restrictions.  Plan to have someone take you home from the hospital or clinic.  If you will be going home right after the procedure, plan to have someone with you for 24 hours.  Ask your health care provider what steps will be taken to help prevent infection. These may include washing your skin with a germ-killing soap. What happens during the procedure?  An IV will be inserted into one of your veins.  Sticky patches (electrodes) or metal paddles may be placed on your chest.  You will be given a medicine to help you relax (sedative).  An electrical shock will be delivered. The procedure may vary among health care providers and hospitals.   What can I expect after the procedure?  Your blood pressure, heart rate, breathing rate, and blood oxygen level will be monitored until you leave the hospital or clinic.  Your heart rhythm will be watched to make sure it does not change.  You may have some redness on the skin where the shocks were given. Follow these instructions at home:  Do not drive for 24 hours if you were given a sedative during your procedure.  Take over-the-counter and prescription medicines only as told by your health care provider.  Ask your health care provider how to check your pulse. Check it often.  Rest for 48 hours after the procedure   or as told by your health care provider.  Avoid or limit your caffeine use as told by your health care provider.  Keep all follow-up visits as told by your health care provider. This is important. Contact a health care provider if:  You feel like your heart is beating too quickly or your pulse is not regular.  You have a serious muscle cramp that does not go  away. Get help right away if:  You have discomfort in your chest.  You are dizzy or you feel faint.  You have trouble breathing or you are short of breath.  Your speech is slurred.  You have trouble moving an arm or leg on one side of your body.  Your fingers or toes turn cold or blue. Summary  Electrical cardioversion is the delivery of a jolt of electricity to restore a normal rhythm to the heart.  This procedure may be done right away in an emergency or may be a scheduled procedure if the condition is not an emergency.  Generally, this is a safe procedure.  After the procedure, check your pulse often as told by your health care provider. This information is not intended to replace advice given to you by your health care provider. Make sure you discuss any questions you have with your health care provider. Document Revised: 04/04/2019 Document Reviewed: 04/04/2019 Elsevier Patient Education  2021 Elsevier Inc.  

## 2020-12-13 NOTE — Transfer of Care (Signed)
Immediate Anesthesia Transfer of Care Note  Patient: Peter Mclean  Procedure(s) Performed: CARDIOVERSION (N/A )  Patient Location: Endoscopy Unit  Anesthesia Type:General  Level of Consciousness: drowsy  Airway & Oxygen Therapy: Patient Spontanous Breathing  Post-op Assessment: Report given to RN and Post -op Vital signs reviewed and stable  Post vital signs: Reviewed and stable  Last Vitals:  Vitals Value Taken Time  BP    Temp    Pulse    Resp    SpO2      Last Pain:  Vitals:   12/13/20 1104  TempSrc: Oral  PainSc: 0-No pain         Complications: No complications documented.

## 2020-12-14 ENCOUNTER — Encounter (HOSPITAL_COMMUNITY): Payer: Self-pay | Admitting: Internal Medicine

## 2020-12-20 ENCOUNTER — Encounter (HOSPITAL_COMMUNITY): Payer: Self-pay | Admitting: Nurse Practitioner

## 2020-12-20 ENCOUNTER — Ambulatory Visit (HOSPITAL_COMMUNITY)
Admission: RE | Admit: 2020-12-20 | Discharge: 2020-12-20 | Disposition: A | Discharge status: Home / Self Care | Payer: Medicare HMO | Source: Ambulatory Visit | Attending: Nurse Practitioner | Admitting: Nurse Practitioner

## 2020-12-20 ENCOUNTER — Other Ambulatory Visit: Payer: Self-pay

## 2020-12-20 VITALS — BP 140/74 | HR 53 | Ht 67.0 in | Wt 136.0 lb

## 2020-12-20 DIAGNOSIS — I214 Non-ST elevation (NSTEMI) myocardial infarction: Secondary | ICD-10-CM | POA: Insufficient documentation

## 2020-12-20 DIAGNOSIS — Z885 Allergy status to narcotic agent status: Secondary | ICD-10-CM | POA: Diagnosis not present

## 2020-12-20 DIAGNOSIS — F17210 Nicotine dependence, cigarettes, uncomplicated: Secondary | ICD-10-CM | POA: Insufficient documentation

## 2020-12-20 DIAGNOSIS — Z79899 Other long term (current) drug therapy: Secondary | ICD-10-CM | POA: Diagnosis not present

## 2020-12-20 DIAGNOSIS — I483 Typical atrial flutter: Secondary | ICD-10-CM

## 2020-12-20 DIAGNOSIS — D6869 Other thrombophilia: Secondary | ICD-10-CM | POA: Diagnosis not present

## 2020-12-20 DIAGNOSIS — I4891 Unspecified atrial fibrillation: Secondary | ICD-10-CM | POA: Insufficient documentation

## 2020-12-20 DIAGNOSIS — I5021 Acute systolic (congestive) heart failure: Secondary | ICD-10-CM | POA: Insufficient documentation

## 2020-12-20 DIAGNOSIS — Z7901 Long term (current) use of anticoagulants: Secondary | ICD-10-CM | POA: Diagnosis not present

## 2020-12-20 DIAGNOSIS — I110 Hypertensive heart disease with heart failure: Secondary | ICD-10-CM | POA: Insufficient documentation

## 2020-12-20 DIAGNOSIS — I4892 Unspecified atrial flutter: Secondary | ICD-10-CM | POA: Insufficient documentation

## 2020-12-20 MED ORDER — ASPIRIN EC 81 MG PO TBEC
81.0000 mg | DELAYED_RELEASE_TABLET | Freq: Every day | ORAL | 3 refills | Status: DC
Start: 2020-12-20 — End: 2020-12-26

## 2020-12-20 NOTE — Patient Instructions (Signed)
Decrease amiodarone to 200mg  once a day  Follow up with Dr. in 1 month  Follow up with Dr. Allyson Sabal

## 2020-12-20 NOTE — Progress Notes (Signed)
Primary Care Physician: Barbie Banner, MD Referring Physician:[facility] f/u Cardiologist: Dr. [name] is a 70 y.o. male with a h/o paroxysmal afib, CAD, tobacco abuse,  s/p ablation by Dr. Johney Frame in the remote past, that was in the hospital  3/16 to 11/30/20 for a NSTEMI, receiving a stent of the LAD. He  presented in afib/flutter with RVR. He was started on amiodarone 200 mg daily (had some elevation of liver enzymes in the past so the loading  dose was kept low). He left the hospital in atrial flutter at 108 bpm.  He had an unsuccessful CV in the ER.   His EKG reads typical atrial flutter at 146 bpm, Acute Stemi,  but I  feel this is an erroneous reading  from the flutter waves .He  denies chest pain today and feels fine.  I also spoke to Dr. Johney Frame who reviewed the EKG and confirms the readout is erroneous and is secondary to the flutter waves. He is now on triple therapy, asa, plavix and eliquis.   Dr. Johney Frame suggests leaving Amiodarone  at 200 mg qd and increasing the metoprolol to 200 mg daily for better rate control.  F/u in afib clinic, 12/10/20. Pt was suppose to f/u last Friday, called in and said he could not come. His HR was still running 140 bpm. He is on max BB so decided to increase amiodarone to 200 mg bid. He today still shows STEMI( erroneous auto read)  with atrial flutter at 135 bpm. He states that he feels fine. I am concerned re the strain on his heart with RVR with a recent NSTEMI  and new stent of the LAD. I discussed proceeding with cardioversion and he is in agreement.  F/u 12/20/20. He is back in afib clinic, f/u unsuccessful cardioversion, despite amiodarone load.  However, he has now self  converted, possibly Saturday, as that is when he noted a slower heart rate. Physically, he does not feel any different.  I will refer back to Dr. Johney Frame to be considered for repeat ablation. He also did not receive any f/u appointment with cardiology for his CAD/NSTEMI/stent and  I will obtain an appointment with Dr. Allyson Sabal to establish.   Today, he denies symptoms of palpitations, chest pain, shortness of breath, orthopnea, PND, lower extremity edema, dizziness, presyncope, syncope, or neurologic sequela. The patient is tolerating medications without difficulties and is otherwise without complaint today.   Past Medical History:  Diagnosis Date  . Anxiety   . Arthritis   . Chronic systolic CHF (congestive heart failure) (HCC)    a. Dx 09/2013 in setting of AF-RVR; EF 10-15%, possibly tachy-mediated. Cath 09/2013: normal cors. b. TEE: EF 20-25% by echo 10/2013.  Marland Kitchen Dyslipidemia (high LDL; low HDL)   . Elevated liver enzymes   . H/O hematuria   . History of inguinal hernia   . Hypertension   . NICM (nonischemic cardiomyopathy) (HCC)   . Non-compliance    a. Adm 09/2013 after having stopped his meds.  . Paroxysmal atrial fibrillation (HCC)   . Paroxysmal atrial flutter (HCC)    typical appearing  . Pseudoaneurysm following procedure (HCC)    a. RLE pseudoaneurysm after cath, spontaneously decreased in size, did not require compression.  . Sinus bradycardia    a. HR 40's in ER 11/2013 - digoxin stopped, Coreg decreased.  . Tobacco abuse    Past Surgical History:  Procedure Laterality Date  . ABLATION  06-13-14   PVI  and CTI by Dr Johney Frame  . ATRIAL FIBRILLATION ABLATION N/A 06/13/2014   Procedure: ATRIAL FIBRILLATION ABLATION;  Surgeon: Gardiner Rhyme, MD;  [facility]: [facility] CATH LAB;  Service: Cardiovascular;  Laterality: N/A;  . CARDIOVERSION N/A 10/17/2013   Procedure: CARDIOVERSION;  Surgeon: Thurmon Fair, MD;  Location: [facility] ENDOSCOPY;  Service: Cardiovascular;  Laterality: N/A;  . CARDIOVERSION N/A 12/13/2020   Procedure: CARDIOVERSION;  Surgeon: Pricilla Riffle, MD;  Location: [facility] ENDOSCOPY;  Service: Cardiovascular;  Laterality: N/A;  . CHOLECYSTECTOMY    . CORONARY STENT INTERVENTION N/A 11/29/2020   Procedure: CORONARY STENT INTERVENTION;  Surgeon: Runell Gess,  MD;  Location: [facility] INVASIVE CV LAB;  Service: Cardiovascular;  Laterality: N/A;  . HERNIA REPAIR  1992 & 2011  . INTRAVASCULAR IMAGING/OCT N/A 11/29/2020   Procedure: INTRAVASCULAR IMAGING/OCT;  Surgeon: Runell Gess, MD;  Location: [facility] INVASIVE CV LAB;  Service: Cardiovascular;  Laterality: N/A;  . LEFT AND RIGHT HEART CATHETERIZATION WITH CORONARY ANGIOGRAM N/A 10/13/2013   Procedure: LEFT AND RIGHT HEART CATHETERIZATION WITH CORONARY ANGIOGRAM;  Surgeon: Marykay Lex, MD;  Location: [facility] CATH LAB;  Service: Cardiovascular;  Laterality: N/A;  . LEFT HEART CATH AND CORONARY ANGIOGRAPHY N/A 11/29/2020   Procedure: LEFT HEART CATH AND CORONARY ANGIOGRAPHY;  Surgeon: Runell Gess, MD;  Location: [facility] INVASIVE CV LAB;  Service: Cardiovascular;  Laterality: N/A;  . PSEUDOANERYSM COMPRESSION  11/11/2013   Procedure: PSEUDOANERYSM COMPRESSION;  Surgeon: Pricilla Riffle, MD;  Location: [facility] CATH LAB;  Service: Cardiovascular;;  . TEE WITHOUT CARDIOVERSION N/A 10/17/2013   Procedure: TRANSESOPHAGEAL ECHOCARDIOGRAM (TEE);  Surgeon: Thurmon Fair, MD;  Location: [facility] ENDOSCOPY;  Service: Cardiovascular;  Laterality: N/A;  . TEE WITHOUT CARDIOVERSION N/A 06/12/2014   Procedure: TRANSESOPHAGEAL ECHOCARDIOGRAM (TEE);  Surgeon: Pricilla Riffle, MD;  Location: [facility] ENDOSCOPY;  Service: Cardiovascular;  Laterality: N/A;    Current Outpatient Medications  Medication Sig Dispense Refill  . albuterol (VENTOLIN HFA) 108 (90 Base) MCG/ACT inhaler 1 or 2 puffs up to 4 times a day as needed for wheezing    . ALPRAZolam (XANAX) 1 MG tablet Take 0.5-1 mg by mouth 4 (four) times daily as needed for anxiety or sleep.    Marland Kitchen amiodarone (PACERONE) 200 MG tablet Take 1 tablet (200 mg total) by mouth daily. (Patient taking differently: Take 200 mg by mouth 2 (two) times daily.) 90 tablet 3  . aspirin EC 81 MG tablet Take 1 tablet (81 mg total) by mouth daily. Swallow whole. 30 tablet 0  . atorvastatin (LIPITOR) 80 MG tablet Take 1  tablet (80 mg total) by mouth daily. 90 tablet 3  . clopidogrel (PLAVIX) 75 MG tablet Take 1 tablet (75 mg total) by mouth daily with breakfast. 90 tablet 3  . ELIQUIS 5 MG TABS tablet TAKE 1 TABLET BY MOUTH TWICE A DAY 60 tablet 5  . gabapentin (NEURONTIN) 300 MG capsule Take 300 mg by mouth 3 (three) times daily.    Marland Kitchen HYDROcodone-acetaminophen (NORCO/VICODIN) 5-325 MG tablet Take 1 tablet by mouth every 6 (six) hours as needed (severe headaches).    . metoprolol succinate (TOPROL-XL) 100 MG 24 hr tablet Take 1 tablet (100 mg total) by mouth in the morning and at bedtime. 45 tablet 3  . nitroGLYCERIN (NITROSTAT) 0.4 MG SL tablet Place 1 tablet (0.4 mg total) under the tongue every 5 (five) minutes as needed for chest pain. 25 tablet 3   No current facility-administered medications for this encounter.    Allergies  Allergen  Reactions  . Tramadol Nausea Only    Social History   Socioeconomic History  . Marital status: Married    Spouse name: Not on file  . Number of children: Not on file  . Years of education: Not on file  . Highest education level: Not on file  Occupational History  . 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 Occupation:    . Occupation:    Tobacco Use  . Smoking status: Current Some Day Smoker    Packs/day: 0.50    Years: 40.00    Pack years: 20.00    Last attempt to quit: 06/07/2014    Years since quitting: 6.5  . Smokeless tobacco: Never Used  . Tobacco comment: Has cut back to 2-3 cigarettes per day.  Substance and Sexual Activity  . Alcohol use: Yes    Alcohol/week: 0.0 standard drinks    Comment: 1-2 A DAY  . Drug use: Not on file  . Sexual activity: Not on file  Other Topics Concern  . Not on file  Social History Narrative   Lives in [place] with wife.    Social Determinants of Health   Financial Resource Strain: Not on file  Food Insecurity: Not on file  Transportation Needs: Not on file  Physical  Activity: Not on file  Stress: Not on file  Social Connections: Not on file  Intimate Partner Violence: Not on file    Family History  Problem Relation Age of Onset  . Hyperlipidemia Mother   . Hypertension Mother   . Heart attack Father     ROS- All systems are reviewed and negative except as per the HPI above  Physical Exam: There were no vitals filed for this visit. Wt Readings from Last 3 Encounters:  12/13/20 62.1 kg  12/10/20 62.2 kg  12/05/20 60.5 kg    Labs: Lab Results  Component Value Date   NA 142 12/10/2020   K 3.7 12/10/2020   CL 106 12/10/2020   CO2 31 12/10/2020   GLUCOSE 101 (H) 12/10/2020   BUN 16 12/10/2020   CREATININE 1.11 12/10/2020   CALCIUM 8.8 (L) 12/10/2020   MG 1.7 11/27/2020   Lab Results  Component Value Date   INR 1.11 07/08/2016   Lab Results  Component Value Date   CHOL 152 11/28/2020   HDL 36 (L) 11/28/2020   LDLCALC 103 (H) 11/28/2020   TRIG 64 11/28/2020     GEN- The patient is well appearing, alert and oriented x 3 today.   Head- normocephalic, atraumatic Eyes-  Sclera clear, conjunctiva pink Ears- hearing intact Oropharynx- clear Neck- supple, no JVP Lymph- no cervical lymphadenopathy Lungs- Clear to ausculation bilaterally, normal work of breathing Heart-  regular rate and rhythm, no murmurs, rubs or gallops, PMI not laterally displaced GI- soft, NT, ND, + BS Extremities- no clubbing, cyanosis, or edema MS- no significant deformity or atrophy Skin- no rash or lesion Psych- euthymic mood, full affect Neuro- strength and sensation are intact  EKG-  Sinus brady at 53 bpm, IRBBB, LAPB, pr int 184 ms, qrs int 102 ms, qtc 456 ms     Assessment and Plan: 1. Afib/flutter with RVR Unsuccessful cardioversion  Self converted couple days after CV, possibly from  amio load finally being therapeutic  Go back now to amiodarone  200 mg daily from  200 mg bid  2. NSTEMI 11/28/20 Received a stent to proximal LAD Troponin  elevation  out of proportion to  the RVR on presentation Underwent LHC and received a  stent to  the LAD  Continue asa, plavix,  he will need uninterrupted asa/plavix x 12 months  He did not receive f/u appointment on hospital d/c Will request f/u with Dr. Allyson Sabal    3. CHA2DS2VASc score of 5 Continue  eliquis 5 mg bid   4. Acute  systolic HF EF was 30-35%  Will need repeat echo several months from now in [place] to see if EF improves   5. HTN Stable Losartan was held in the hospital to get BB up titrated   6. Tobacco abuse Smoking cessation encouraged   F/u requested with Dr. Johney Frame and Dr. Ellin Saba C. [facility] [address] [place], [place] [address] [phone]

## 2020-12-21 ENCOUNTER — Emergency Department (HOSPITAL_COMMUNITY): Payer: Medicare HMO

## 2020-12-21 ENCOUNTER — Emergency Department (HOSPITAL_COMMUNITY)
Admission: EM | Admit: 2020-12-21 | Discharge: 2020-12-22 | Disposition: A | Discharge status: Home / Self Care | Payer: Medicare HMO | Attending: Emergency Medicine | Admitting: Emergency Medicine

## 2020-12-21 DIAGNOSIS — R0789 Other chest pain: Secondary | ICD-10-CM | POA: Diagnosis present

## 2020-12-21 DIAGNOSIS — Z7901 Long term (current) use of anticoagulants: Secondary | ICD-10-CM | POA: Diagnosis not present

## 2020-12-21 DIAGNOSIS — I4892 Unspecified atrial flutter: Secondary | ICD-10-CM | POA: Insufficient documentation

## 2020-12-21 DIAGNOSIS — Z7902 Long term (current) use of antithrombotics/antiplatelets: Secondary | ICD-10-CM | POA: Insufficient documentation

## 2020-12-21 DIAGNOSIS — I5023 Acute on chronic systolic (congestive) heart failure: Secondary | ICD-10-CM | POA: Diagnosis not present

## 2020-12-21 DIAGNOSIS — I25119 Atherosclerotic heart disease of native coronary artery with unspecified angina pectoris: Secondary | ICD-10-CM

## 2020-12-21 DIAGNOSIS — I480 Paroxysmal atrial fibrillation: Secondary | ICD-10-CM

## 2020-12-21 DIAGNOSIS — R079 Chest pain, unspecified: Secondary | ICD-10-CM

## 2020-12-21 DIAGNOSIS — F17210 Nicotine dependence, cigarettes, uncomplicated: Secondary | ICD-10-CM | POA: Diagnosis not present

## 2020-12-21 DIAGNOSIS — Z79899 Other long term (current) drug therapy: Secondary | ICD-10-CM | POA: Diagnosis not present

## 2020-12-21 DIAGNOSIS — I110 Hypertensive heart disease with heart failure: Secondary | ICD-10-CM | POA: Insufficient documentation

## 2020-12-21 DIAGNOSIS — I5022 Chronic systolic (congestive) heart failure: Secondary | ICD-10-CM | POA: Diagnosis not present

## 2020-12-21 DIAGNOSIS — Z7982 Long term (current) use of aspirin: Secondary | ICD-10-CM | POA: Insufficient documentation

## 2020-12-21 LAB — COMPREHENSIVE METABOLIC PANEL
ALT: 35 U/L (ref 0–44)
AST: 28 U/L (ref 15–41)
Albumin: 2.9 g/dL — ABNORMAL LOW (ref 3.5–5.0)
Alkaline Phosphatase: 91 U/L (ref 38–126)
Anion gap: 6 (ref 5–15)
BUN: 13 mg/dL (ref 8–23)
CO2: 27 mmol/L (ref 22–32)
Calcium: 8.3 mg/dL — ABNORMAL LOW (ref 8.9–10.3)
Chloride: 105 mmol/L (ref 98–111)
Creatinine, Ser: 1 mg/dL (ref 0.61–1.24)
GFR, Estimated: >60 mL/min (ref >60)
Glucose, Bld: 88 mg/dL (ref 70–99)
Potassium: 3.9 mmol/L (ref 3.5–5.1)
Sodium: 138 mmol/L (ref 135–145)
Total Bilirubin: 1.1 mg/dL (ref 0.3–1.2)
Total Protein: 5.4 g/dL — ABNORMAL LOW (ref 6.5–8.1)

## 2020-12-21 LAB — CBC
HCT: 35.2 % — ABNORMAL LOW (ref 39.0–52.0)
Hemoglobin: 11.3 g/dL — ABNORMAL LOW (ref 13.0–17.0)
MCH: 30.8 pg (ref 26.0–34.0)
MCHC: 32.1 g/dL (ref 30.0–36.0)
MCV: 95.9 fL (ref 80.0–100.0)
Platelets: 203 10*3/uL (ref 150–400)
RBC: 3.67 MIL/uL — ABNORMAL LOW (ref 4.22–5.81)
RDW: 13.7 % (ref 11.5–15.5)
WBC: 10.2 10*3/uL (ref 4.0–10.5)
nRBC: 0 % (ref 0.0–0.2)

## 2020-12-21 LAB — BRAIN NATRIURETIC PEPTIDE: B Natriuretic Peptide: 336.3 pg/mL — ABNORMAL HIGH (ref 0.0–100.0)

## 2020-12-21 LAB — TROPONIN I (HIGH SENSITIVITY)
Troponin I (High Sensitivity): 27 ng/L — ABNORMAL HIGH (ref <18)
Troponin I (High Sensitivity): 20 ng/L — ABNORMAL HIGH (ref <18)

## 2020-12-21 MED ORDER — FUROSEMIDE 20 MG PO TABS: 20.0000 mg | ORAL_TABLET | Freq: Every day | ORAL | 0 refills | Status: DC

## 2020-12-21 MED ORDER — FUROSEMIDE 10 MG/ML IJ SOLN
40.0000 mg | Freq: Once | INTRAMUSCULAR | Status: AC
  Administered 2020-12-21: 40 mg via INTRAVENOUS
  Filled 2020-12-21: qty 4

## 2020-12-21 MED ORDER — AMIODARONE HCL 200 MG PO TABS: 200.0000 mg | ORAL_TABLET | Freq: Two times a day (BID) | ORAL | 3 refills | Status: DC

## 2020-12-21 MED ORDER — POTASSIUM CHLORIDE CRYS ER 20 MEQ PO TBCR
40.0000 meq | EXTENDED_RELEASE_TABLET | Freq: Once | ORAL | Status: AC
  Administered 2020-12-21: 40 meq via ORAL
  Filled 2020-12-21: qty 2

## 2020-12-21 MED ORDER — SPIRONOLACTONE 25 MG PO TABS: 12.5000 mg | ORAL_TABLET | Freq: Every day | ORAL | 11 refills | Status: AC

## 2020-12-21 NOTE — ED Provider Notes (Signed)
[name] EMERGENCY DEPARTMENT Provider Note   CSN: [id number] Arrival date & time: 12/21/20  1101     History Chief Complaint  Patient presents with  . Chest Pain    Peter Mclean is a 70 y.o. male.  HPI Patient presents with chest pain.  Anterior chest.  Pressure.  States it feels like his previous heart attack.  March 15 had a non-STEMI.  Had LAD stent.  Also had A. fib and had attempted cardioversion was unsuccessful but with follow-up with A. fib clinic had converted to sinus rhythm.  States for last 2 weeks has been having pains.  States in the anterior chest.  States worse last night.  States that sometimes feels worse when he lays back.  States began worse when he had a disagreement with his son last night.  No nausea or vomiting.  No diaphoresis.    Past Medical History:  Diagnosis Date  . Anxiety   . Arthritis   . Chronic systolic CHF (congestive heart failure) (HCC)    a. Dx 09/2013 in setting of AF-RVR; EF 10-15%, possibly tachy-mediated. Cath 09/2013: normal cors. b. TEE: EF 20-25% by echo 10/2013.  Marland Kitchen Dyslipidemia (high LDL; low HDL)   . Elevated liver enzymes   . H/O hematuria   . History of inguinal hernia   . Hypertension   . NICM (nonischemic cardiomyopathy) (HCC)   . Non-compliance    a. Adm 09/2013 after having stopped his meds.  . Paroxysmal atrial fibrillation (HCC)   . Paroxysmal atrial flutter (HCC)    typical appearing  . Pseudoaneurysm following procedure (HCC)    a. RLE pseudoaneurysm after cath, spontaneously decreased in size, did not require compression.  . Sinus bradycardia    a. HR 40's in ER 11/2013 - digoxin stopped, Coreg decreased.  . Tobacco abuse     Patient Active Problem List   Diagnosis Date Noted  . Non-ST elevation (NSTEMI) myocardial infarction (HCC)   . Atrial fibrillation with rapid ventricular response (HCC) 11/27/2020  . Atrial fibrillation (HCC) 06/13/2014  . Chronic systolic CHF (congestive heart failure)  (HCC) 12/13/2013  . Sinus bradycardia 12/13/2013  . Postoperative groin pseudoaneurysm 11/17/2013  . Tobacco abuse   . NICM (nonischemic cardiomyopathy) (HCC) 10/14/2013  . Non compliance with medical treatment- stopped meds prior to adm 10/14/2013  . Dyslipidemia (high LDL; low HDL)   . Persistent atrial fibrillation (HCC) 10/12/2013  . HTN (hypertension) 10/12/2013  . Paroxysmal atrial flutter (HCC) 05/24/2009  . Anxiety state 05/23/2009  . ARTHRITIS 05/23/2009  . HEMATURIA, HX OF 05/23/2009  . INGUINAL HERNIA, HX OF 05/23/2009    Past Surgical History:  Procedure Laterality Date  . ABLATION  06-13-14   PVI and CTI by Dr Johney Frame  . ATRIAL FIBRILLATION ABLATION N/A 06/13/2014   Procedure: ATRIAL FIBRILLATION ABLATION;  Surgeon: Gardiner Rhyme, MD;  Location: [facility] CATH LAB;  Service: Cardiovascular;  Laterality: N/A;  . CARDIOVERSION N/A 10/17/2013   Procedure: CARDIOVERSION;  Surgeon: Thurmon Fair, MD;  Location: [facility] ENDOSCOPY;  Service: Cardiovascular;  Laterality: N/A;  . CARDIOVERSION N/A 12/13/2020   Procedure: CARDIOVERSION;  Surgeon: Pricilla Riffle, MD;  Location: [facility] ENDOSCOPY;  Service: Cardiovascular;  Laterality: N/A;  . CHOLECYSTECTOMY    . CORONARY STENT INTERVENTION N/A 11/29/2020   Procedure: CORONARY STENT INTERVENTION;  Surgeon: Runell Gess, MD;  Location: [facility] INVASIVE CV LAB;  Service: Cardiovascular;  Laterality: N/A;  . HERNIA REPAIR  1992 & 2011  . INTRAVASCULAR IMAGING/OCT  N/A 11/29/2020   Procedure: INTRAVASCULAR IMAGING/OCT;  Surgeon: Runell Gess, MD;  Location: [facility] INVASIVE CV LAB;  Service: Cardiovascular;  Laterality: N/A;  . LEFT AND RIGHT HEART CATHETERIZATION WITH CORONARY ANGIOGRAM N/A 10/13/2013   Procedure: LEFT AND RIGHT HEART CATHETERIZATION WITH CORONARY ANGIOGRAM;  Surgeon: Marykay Lex, MD;  Location: [facility] CATH LAB;  Service: Cardiovascular;  Laterality: N/A;  . LEFT HEART CATH AND CORONARY ANGIOGRAPHY N/A 11/29/2020   Procedure: LEFT HEART CATH  AND CORONARY ANGIOGRAPHY;  Surgeon: Runell Gess, MD;  Location: [facility] INVASIVE CV LAB;  Service: Cardiovascular;  Laterality: N/A;  . PSEUDOANERYSM COMPRESSION  11/11/2013   Procedure: PSEUDOANERYSM COMPRESSION;  Surgeon: Pricilla Riffle, MD;  Location: [facility] CATH LAB;  Service: Cardiovascular;;  . TEE WITHOUT CARDIOVERSION N/A 10/17/2013   Procedure: TRANSESOPHAGEAL ECHOCARDIOGRAM (TEE);  Surgeon: Thurmon Fair, MD;  Location: [facility] ENDOSCOPY;  Service: Cardiovascular;  Laterality: N/A;  . TEE WITHOUT CARDIOVERSION N/A 06/12/2014   Procedure: TRANSESOPHAGEAL ECHOCARDIOGRAM (TEE);  Surgeon: Pricilla Riffle, MD;  Location: [facility] ENDOSCOPY;  Service: Cardiovascular;  Laterality: N/A;       Family History  Problem Relation Age of Onset  . Hyperlipidemia Mother   . Hypertension Mother   . Heart attack Father     Social History   Tobacco Use  . Smoking status: Current Some Day Smoker    Packs/day: 0.50    Years: 40.00    Pack years: 20.00    Last attempt to quit: 06/07/2014    Years since quitting: 6.5  . Smokeless tobacco: Never Used  . Tobacco comment: Has cut back to 2-3 cigarettes per day.  Substance Use Topics  . Alcohol use: Not Currently    Alcohol/week: 0.0 standard drinks    Comment: 1-2 A DAY  . Drug use: Never    Home Medications Prior to Admission medications   Medication Sig Start Date End Date Taking? Authorizing Provider  albuterol (VENTOLIN HFA) 108 (90 Base) MCG/ACT inhaler 1 or 2 puffs up to 4 times a day as needed for wheezing 10/18/20   [provider]  ALPRAZolam Prudy Feeler) 1 MG tablet Take 0.5-1 mg by mouth 4 (four) times daily as needed for anxiety or sleep.    [provider]  amiodarone (PACERONE) 200 MG tablet Take 1 tablet (200 mg total) by mouth daily. 11/30/20   Marcelino Duster, PA  aspirin EC 81 MG tablet Take 1 tablet (81 mg total) by mouth daily. Swallow whole. 12/20/20 01/19/21  Newman Nip, NP  atorvastatin (LIPITOR) 80 MG tablet Take 1 tablet  (80 mg total) by mouth daily. 11/30/20   Marcelino Duster, PA  clopidogrel (PLAVIX) 75 MG tablet Take 1 tablet (75 mg total) by mouth daily with breakfast. 11/30/20   [facility], Roe Rutherford, PA  ELIQUIS 5 MG TABS tablet TAKE 1 TABLET BY MOUTH TWICE A DAY 12/01/16   Allred, Fayrene Fearing, MD  gabapentin (NEURONTIN) 300 MG capsule Take 300 mg by mouth 3 (three) times daily. 09/18/19   [provider]  metoprolol succinate (TOPROL-XL) 100 MG 24 hr tablet Take 1 tablet (100 mg total) by mouth in the morning and at bedtime. 12/05/20 03/05/21  Newman Nip, NP  nitroGLYCERIN (NITROSTAT) 0.4 MG SL tablet Place 1 tablet (0.4 mg total) under the tongue every 5 (five) minutes as needed for chest pain. 11/30/20 11/30/21  Marcelino Duster, PA    Allergies    Tramadol  Review of Systems   Review of Systems  Constitutional: Negative for appetite change.  HENT: Negative for congestion.   Respiratory: Negative for shortness of breath.   Cardiovascular: Positive for chest pain.  Gastrointestinal: Negative for abdominal pain.  Genitourinary: Negative for flank pain.  Musculoskeletal: Negative for arthralgias.  Skin: Negative for rash.  Neurological: Negative for numbness.  Psychiatric/Behavioral: Negative for behavioral problems.    Physical Exam Updated Vital Signs BP (!) 142/94   Pulse 100   Temp 97.9 F (36.6 C) (Oral)   Resp (!) 30   Ht 5\' 7"  (1.702 m)   Wt 61 kg   SpO2 93%   BMI 21.06 kg/m   Physical Exam Vitals and nursing note reviewed.  HENT:     Head: Normocephalic.  Eyes:     Extraocular Movements: Extraocular movements intact.  Cardiovascular:     Rate and Rhythm: Normal rate and regular rhythm.  Pulmonary:     Breath sounds: No wheezing or rhonchi.  Chest:     Chest wall: No tenderness.  Abdominal:     Tenderness: There is no abdominal tenderness.  Musculoskeletal:     Cervical back: Neck supple.     Right lower leg: No edema.     Left lower leg: No edema.  Skin:     General: Skin is warm.     Capillary Refill: Capillary refill takes less than 2 seconds.  Neurological:     Mental Status: He is alert and oriented to person, place, and time.     ED Results / Procedures / Treatments   Labs (all labs ordered are listed, but only abnormal results are displayed) Labs Reviewed  CBC - Abnormal; Notable for the following components:      Result Value   RBC 3.67 (*)    Hemoglobin 11.3 (*)    HCT 35.2 (*)    All other components within normal limits  COMPREHENSIVE METABOLIC PANEL - Abnormal; Notable for the following components:   Calcium 8.3 (*)    Total Protein 5.4 (*)    Albumin 2.9 (*)    All other components within normal limits  TROPONIN I (HIGH SENSITIVITY) - Abnormal; Notable for the following components:   Troponin I (High Sensitivity) 27 (*)    All other components within normal limits  TROPONIN I (HIGH SENSITIVITY) - Abnormal; Notable for the following components:   Troponin I (High Sensitivity) 20 (*)    All other components within normal limits  BRAIN NATRIURETIC PEPTIDE    EKG EKG Interpretation  Date/Time:  Friday December 21 2020 12:13:53 EDT Ventricular Rate:  98 PR Interval:    QRS Duration: 110 QT Interval:  478 QTC Calculation: 611 R Axis:   156 Text Interpretation: Atrial flutter Probable right ventricular hypertrophy Nonspecific T abnormalities, anterior leads Prolonged QT interval Confirmed by Benjiman Core [phone]) on 12/21/2020 12:15:44 PM   Radiology DG Chest Portable 1 View  Result Date: 12/21/2020 CLINICAL DATA:  Chest pain EXAM: PORTABLE CHEST 1 VIEW COMPARISON:  11/27/2020 FINDINGS: Normal heart size and mediastinal contours. No acute infiltrate or edema. No effusion or pneumothorax. No acute osseous findings. IMPRESSION: No active disease. Electronically Signed   By: Marnee Spring M.D.   On: 12/21/2020 12:18    Procedures Procedures   Medications Ordered in ED Medications - No data to display  ED Course   I have reviewed the triage vital signs and the nursing notes.  Pertinent labs & imaging results that were available during my care of the patient were reviewed by me and  considered in my medical decision making (see chart for details).    MDM Rules/Calculators/A&P                         Patient with chest pain.  He states he has had it for 2 weeks but worse the last day or 2.  Found to be in atrial fibrillation.  Has had known A. fib and is on anticoagulation.  Had attempted outpatient cardioversion that failed however at follow-up clinic yesterday and the A. fib office was in a sinus rhythm.  However today is in A.flutter again.  Troponin slightly above normal but less than it was with his recent non-STEMI.  Will have patient seen by cardiology.  Chest x-ray reassuring. Final Clinical Impression(s) / ED Diagnoses Final diagnoses:  Atrial flutter, unspecified type (HCC)  Nonspecific chest pain    Rx / DC Orders ED Discharge Orders    None       Benjiman Core, MD 12/21/20 1624

## 2020-12-21 NOTE — ED Provider Notes (Signed)
  Physical Exam  BP (!) 142/94   Pulse 100   Temp 97.9 F (36.6 C) (Oral)   Resp (!) 30   Ht 5\' 7"  (1.702 m)   Wt 61 kg   SpO2 93%   BMI 21.06 kg/m   Physical Exam  ED Course/Procedures     Procedures  MDM  Have written for Lasix 40 mg IV x1 and K. Dur 40 mEq. If his breathing improves and he remains chest pain-free, and his atrial fib is not sustained greater than 120, okay to discharge home. He needs a follow-up with Dr. or the team, Allyson Sabal is working on this and needs a follow-up in the atrial fibrillation clinic as well. Rosann Auerbach will arrange. Thanks   I received the above message from Rosann Auerbach, Theodore Demark with cardiology.   Dr. [place] also recommended adding Lasix 20 mg daily x3 days.   Shari Prows, MD 12/21/20 [phone]

## 2020-12-21 NOTE — ED Notes (Signed)
This RN tried to call the pt daughter on status of ride, to see if daughter is able to pick pt up from ED, Daughter did not answer and was not able to leave voicemail

## 2020-12-21 NOTE — H&P (Addendum)
Cardiology Admission History and Physical:   Patient ID: Peter Mclean MRN: [id number]; DOB: Dec 17, 1950   Admission date: 12/21/2020  PCP:  Barbie Banner, MD   [facility]  Cardiologist:  Hillis Range, MD  Advanced Practice Provider:  No care team member to display Electrophysiologist:  None  EP - Advanced Practice Provider:  Newman Nip, NP       Chief Complaint:  Chest pain   Patient Profile:   Peter Mclean is a 70 y.o. male with recurrent atrial fib s/p DCCV 12/13/2020 >> SR, admit 03/15-03/18/2022 for NSTEMI s/p DES LAD, CHF exacerbation. 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  He was discharged 03/18 after NSTEMI s/p DES LAD w/ EF 30-35% by echo, still in Afib at d/c after failed DCCV in ER. Amio was restarted >> f/u in Afib clinic >> came to hospital for DCCV on 03/31 >> not successful.   History of Present Illness:   Peter Mclean was seen by Rudi Coco, NP on 04/07 and was in SR, had self converted on the amio. HR 53 in SR. No CP. Continue amio at 200 mg qd. F/u with Dr Allyson Sabal for regular cardiology. He is to follow up with Dr Johney Frame to discuss repeat ablation.   Yesterday evening, pt got in an argument with his son. He developed chest pain, 10/10, it was a tightness, associated with SOB. He was a little nauseated. He did not take nitro, did not feel comfortable doing that. He was not aware of his heart being out of rhythm. He did not check his BP/HR.  Last pm, he could not rest, could not lay down. Was SOB w/ any activity. No LE edema, +orthopnea and PND.  Finally, it got to be too much and he called EMS. EMS gave him baby ASA and nitro x 3. The pain resolved. The SOB resolved as well.   In the ER, he is in atrial fib, rate mildly elevated, but is not aware of it.    He briefly converted to SR, but went back into Afib. He has not missed any doses of his  Eliquis.  He is tired of being in the hospital and getting stuck all the time.   Past Medical History:  Diagnosis Date  . Anxiety   . Arthritis   . Chronic systolic CHF (congestive heart failure) (HCC)    a. Dx 09/2013 in setting of AF-RVR; EF 10-15%, possibly tachy-mediated. Cath 09/2013: normal cors. b. TEE: EF 20-25% by echo 10/2013.  Marland Kitchen Dyslipidemia (high LDL; low HDL)   . Elevated liver enzymes   . H/O hematuria   . History of inguinal hernia   . Hypertension   . NICM (nonischemic cardiomyopathy) (HCC)   . Non-compliance    a. Adm 09/2013 after having stopped his meds.  . Paroxysmal atrial fibrillation (HCC)   . Paroxysmal atrial flutter (HCC)    typical appearing  . Pseudoaneurysm following procedure (HCC)    a. RLE pseudoaneurysm after cath, spontaneously decreased in size, did not require compression.  . Sinus bradycardia    a. HR 40's in ER 11/2013 - digoxin stopped, Coreg decreased.  . Tobacco abuse     Past Surgical History:  Procedure Laterality Date  . ABLATION  06-13-14   PVI and CTI by Dr Johney Frame  . ATRIAL FIBRILLATION ABLATION N/A 06/13/2014   Procedure: ATRIAL FIBRILLATION ABLATION;  Surgeon:  Gardiner Rhyme, MD;  Location: [facility] CATH LAB;  Service: Cardiovascular;  Laterality: N/A;  . CARDIOVERSION N/A 10/17/2013   Procedure: CARDIOVERSION;  Surgeon: Thurmon Fair, MD;  Location: [facility] ENDOSCOPY;  Service: Cardiovascular;  Laterality: N/A;  . CARDIOVERSION N/A 12/13/2020   Procedure: CARDIOVERSION;  Surgeon: Pricilla Riffle, MD;  Location: [facility] ENDOSCOPY;  Service: Cardiovascular;  Laterality: N/A;  . CHOLECYSTECTOMY    . CORONARY STENT INTERVENTION N/A 11/29/2020   Procedure: CORONARY STENT INTERVENTION;  Surgeon: Runell Gess, MD;  Location: [facility] INVASIVE CV LAB;  Service: Cardiovascular;  Laterality: N/A;  . HERNIA REPAIR  1992 & 2011  . INTRAVASCULAR IMAGING/OCT N/A 11/29/2020   Procedure: INTRAVASCULAR IMAGING/OCT;  Surgeon: Runell Gess, MD;  Location: [facility] INVASIVE  CV LAB;  Service: Cardiovascular;  Laterality: N/A;  . LEFT AND RIGHT HEART CATHETERIZATION WITH CORONARY ANGIOGRAM N/A 10/13/2013   Procedure: LEFT AND RIGHT HEART CATHETERIZATION WITH CORONARY ANGIOGRAM;  Surgeon: Marykay Lex, MD;  Location: [facility] CATH LAB;  Service: Cardiovascular;  Laterality: N/A;  . LEFT HEART CATH AND CORONARY ANGIOGRAPHY N/A 11/29/2020   Procedure: LEFT HEART CATH AND CORONARY ANGIOGRAPHY;  Surgeon: Runell Gess, MD;  Location: [facility] INVASIVE CV LAB;  Service: Cardiovascular;  Laterality: N/A;  . PSEUDOANERYSM COMPRESSION  11/11/2013   Procedure: PSEUDOANERYSM COMPRESSION;  Surgeon: Pricilla Riffle, MD;  Location: [facility] CATH LAB;  Service: Cardiovascular;;  . TEE WITHOUT CARDIOVERSION N/A 10/17/2013   Procedure: TRANSESOPHAGEAL ECHOCARDIOGRAM (TEE);  Surgeon: Thurmon Fair, MD;  Location: [facility] ENDOSCOPY;  Service: Cardiovascular;  Laterality: N/A;  . TEE WITHOUT CARDIOVERSION N/A 06/12/2014   Procedure: TRANSESOPHAGEAL ECHOCARDIOGRAM (TEE);  Surgeon: Pricilla Riffle, MD;  Location: [facility] ENDOSCOPY;  Service: Cardiovascular;  Laterality: N/A;     Medications Prior to Admission: Prior to Admission medications   Medication Sig Start Date End Date Taking? Authorizing Provider  albuterol (VENTOLIN HFA) 108 (90 Base) MCG/ACT inhaler 1 or 2 puffs up to 4 times a day as needed for wheezing 10/18/20   [provider]  ALPRAZolam Prudy Feeler) 1 MG tablet Take 0.5-1 mg by mouth 4 (four) times daily as needed for anxiety or sleep.    [provider]  amiodarone (PACERONE) 200 MG tablet Take 1 tablet (200 mg total) by mouth daily. 11/30/20   Marcelino Duster, PA  aspirin EC 81 MG tablet Take 1 tablet (81 mg total) by mouth daily. Swallow whole. 12/20/20 01/19/21  Newman Nip, NP  atorvastatin (LIPITOR) 80 MG tablet Take 1 tablet (80 mg total) by mouth daily. 11/30/20   Marcelino Duster, PA  clopidogrel (PLAVIX) 75 MG tablet Take 1 tablet (75 mg total) by mouth daily with breakfast.  11/30/20   [facility], Roe Rutherford, PA  ELIQUIS 5 MG TABS tablet TAKE 1 TABLET BY MOUTH TWICE A DAY 12/01/16   Allred, Fayrene Fearing, MD  gabapentin (NEURONTIN) 300 MG capsule Take 300 mg by mouth 3 (three) times daily. 09/18/19   [provider]  metoprolol succinate (TOPROL-XL) 100 MG 24 hr tablet Take 1 tablet (100 mg total) by mouth in the morning and at bedtime. 12/05/20 03/05/21  Newman Nip, NP  nitroGLYCERIN (NITROSTAT) 0.4 MG SL tablet Place 1 tablet (0.4 mg total) under the tongue every 5 (five) minutes as needed for chest pain. 11/30/20 11/30/21  Marcelino Duster, PA     Allergies:    Allergies  Allergen Reactions  . Tramadol Nausea Only    Social History:   Social History  Socioeconomic History  . Marital status: Married    Spouse name: Not on file  . Number of children: Not on file  . Years of education: Not on file  . Highest education level: Not on file  Occupational History  . Occupation: [employer] in [place]    Comment: Engineer, petroleumMachine mechanic  . Occupation:    . Occupation:    Tobacco Use  . Smoking status: Current Some Day Smoker    Packs/day: 0.50    Years: 40.00    Pack years: 20.00    Last attempt to quit: 06/07/2014    Years since quitting: 6.5  . Smokeless tobacco: Never Used  . Tobacco comment: Has cut back to 2-3 cigarettes per day.  Substance and Sexual Activity  . Alcohol use: Not Currently    Alcohol/week: 0.0 standard drinks    Comment: 1-2 A DAY  . Drug use: Never  . Sexual activity: Not on file  Other Topics Concern  . Not on file  Social History Narrative   [place] in [place].    Social Determinants of Health   Financial Resource Strain: Not on file  Food Insecurity: Not on file  Transportation Needs: Not on file  Physical Activity: Not on file  Stress: Not on file  Social Connections: Not on file  Intimate Partner Violence: Not on file    Family History:   Family History  Problem Relation Age of Onset  .  Hyperlipidemia Mother   . Hypertension Mother   . Heart attack Father     Family Status  Relation Name Status  . Mother  Alive       Born [phone]  . Father  Deceased at age 70       HEART ATTACK  . Brother  Alive       No Hx CAD  . MGM  Deceased  . MGF  Deceased  . PGM  Deceased  . PGF  Deceased     ROS:  Please see the history of present illness.  All other ROS reviewed and negative.     Physical Exam/Data:   Vitals:   12/21/20 1445 12/21/20 1557 12/21/20 1600 12/21/20 1615  BP: (!) 151/124 (!) 138/95 (!) 153/98 (!) 142/94  Pulse: (!) 53 99 96 100  Resp: (!) 31 20 (!) 30 (!) 30  Temp:      TempSrc:      SpO2: 100% 95% 95% 93%  Weight:      Height:       No intake or output data in the 24 hours ending 12/21/20 1640 Last 3 Weights 12/21/2020 12/20/2020 12/13/2020  Weight (lbs) 134 lb 7.7 oz 136 lb 137 lb  Weight (kg) 61 kg 61.689 kg 62.143 kg     Body mass index is 21.06 kg/m.  General:  Well nourished, well developed, has mild SOB at rest HEENT: normal for age Lymph: no adenopathy Neck:  JVD 10 cm Endocrine:  No thryomegaly Vascular: No carotid bruits; 4/4  Extremity pulses 2+  Cardiac:  normal S1, S2; Irreg R&R; no murmur  Lungs:  Decreased BS bases w/ some rales bilaterally, no wheezing, rhonchi  Abd: soft, nontender, no hepatomegaly  Ext: no edema Musculoskeletal:  No deformities, BUE and BLE strength normal and equal Skin: warm and dry, multiple areas of ecchymosis secondary to needle sticks and blood draws, especially on left wrist Neuro:  CNs 2-12 intact, no focal abnormalities noted Psych:  Normal affect    EKG:  The ECG that was done today was personally reviewed and demonstrates most likely coarse atrial fib (vs atypical atrial flutter) w/ HR 98, no acute ischemic changes  Relevant CV Studies:  Left heart cath 11/29/20:  Mid LAD lesion is 99% stenosed.  Prox LAD to Mid LAD lesion is 50% stenosed.  Post intervention, there is a 0% residual  stenosis.  Post intervention, there is a 0% residual stenosis.  A drug-eluting stent was successfully placed using a STENT RESOLUTE ONYX 3.0X30.  There is mild to moderate left ventricular systolic dysfunction.  The left ventricular ejection fraction is 45-50% by visual estimate.  LV end diastolic pressure is normal.  IMPRESSION:Peter Mclean had successful orbital atherectomy followed by OCT guided PCI and drug-eluting stenting. This revealed a ruptured plaque in the proximal LAD most likely responsible for his non-STEMI with a 99% calcified plaque just distal to this. The final angiographic result was reduction of a 99% calcified lesion to 0% residual TIMI-3 flow. The patient tolerated the procedure well. The guidewire and catheter removed. The sheath was removed and a TR band was placed on the right wrist to achieve patent hemostasis. The patient did receive a bolus of Aggrastat and he will have a 6-hour infusion given the thrombus demonstrated on OCT. He will need dual antiplatelet therapy uninterrupted for 12 months.  _____________  Echo 11/29/20: 1. Left ventricular ejection fraction, by estimation, is 30 to 35%. The  left ventricle has moderately decreased function. The left ventricle  demonstrates global hypokinesis. Left ventricular diastolic function could  not be evaluated. There is of the left ventricular,.  2. Right ventricular systolic function is normal. The right ventricular  size is normal.  3. The mitral valve is normal in structure. Mild mitral valve  regurgitation.  4. The aortic valve is normal in structure. Aortic valve regurgitation is  not visualized. No aortic stenosis is present.   Laboratory Data:  High Sensitivity Troponin:   Recent Labs  Lab 11/27/20 2040 11/28/20 0107 11/28/20 0344 12/21/20 1327 12/21/20 1345  TROPONINIHS 224* 471* 700* 27* 20*      Chemistry Recent Labs  Lab 12/21/20 1327  NA 138  K 3.9  CL 105  CO2 27   GLUCOSE 88  BUN 13  CREATININE 1.00  CALCIUM 8.3*  GFRNONAA >60  ANIONGAP 6    Recent Labs  Lab 12/21/20 1327  PROT 5.4*  ALBUMIN 2.9*  AST 28  ALT 35  ALKPHOS 91  BILITOT 1.1   Hematology Recent Labs  Lab 12/21/20 1327  WBC 10.2  RBC 3.67*  HGB 11.3*  HCT 35.2*  MCV 95.9  MCH 30.8  MCHC 32.1  RDW 13.7  PLT 203   BNPNo results for input(s): BNP, PROBNP in the last 168 hours.  DDimer No results for input(s): DDIMER in the last 168 hours. Lab Results  Component Value Date   TSH 2.451 11/28/2020   Lab Results  Component Value Date   HGBA1C 5.6 11/28/2020   Lab Results  Component Value Date   CHOL 152 11/28/2020   HDL 36 (L) 11/28/2020   LDLCALC 103 (H) 11/28/2020   TRIG 64 11/28/2020   CHOLHDL 4.2 11/28/2020     Radiology/Studies:  DG Chest Portable 1 View  Result Date: 12/21/2020 CLINICAL DATA:  Chest pain EXAM: PORTABLE CHEST 1 VIEW COMPARISON:  11/27/2020 FINDINGS: Normal heart size and mediastinal contours. No acute infiltrate or edema. No effusion or pneumothorax. No acute osseous findings. IMPRESSION: No active disease. Electronically Signed  By: Marnee Spring M.D.   On: 12/21/2020 12:18     Assessment and Plan:   1. Chest pain -  Cardiac enzymes mildly elevated, but lower than pre-PCI - sx  Resolved w/ nitro x 3 - compliant w/ meds, no doses missed -His ECG would be abnormal and his troponin would be much higher if there was any significant problem with his recently placed stent. Believe that the chest pain came from tachycardia associated with atrial fibrillation and emotional stress. -Continue current therapy with ASA, Plavix, Toprol-XL and high-dose statin  2.Recurrent persistent atrial fib -Heart rate is below or just over 100 at rest. -He is compliant with medications, including Eliquis 5 mg twice daily, amiodarone reduced yesterday from 200 mg twice daily to 200 mg daily, and Toprol-XL 100 mg twice daily -Previously had problems  with bradycardia on Coreg and dig -Concern for atrial fibrillation with poor rate control in the setting of emotional stress causing his chest pain -Atrial fibrillation may also contribute to his CHF/shortness of breath -For both of those reasons, he will benefit from sinus rhythm -As he has gone in/out of sinus rhythm while in the ER, do not see a benefit to cardioversion -Consider increasing the amiodarone back to 200 mg twice daily and arrange follow-up with the A. fib clinic. -Encourage continued compliance with Eliquis  3.  Chronic systolic CHF -During his recent admission, his weight was 136-137 pounds.  He is currently 134 pounds. -He has JVD and decreased breath sounds in his bases on exam, although his chest x-ray reports no acute disease -BNP is pending -After his recent admission for CHF, he was not discharged on a diuretic. -Discuss giving a single dose of IV Lasix and possibly discharging him on a low-dose of Lasix or spironolactone, to follow-up as an outpatient - if Lasix improves sx and sats, d/c home on spiro 12.5 mg qd and f/u w/ cards.   Risk Assessment/Risk Scores:     HEAR Score (for undifferentiated chest pain):  HEAR Score: 4  New York Heart Association (NYHA) Functional Class NYHA Class III  CHA2DS2-VASc Score = 6  This indicates a 9.7% annual risk of stroke. The patient's score is based upon: CHF History: Yes HTN History: Yes Diabetes History: No Stroke History: Yes Vascular Disease History: Yes Age Score: 1 Gender Score: 0     For questions or updates, please contact [facility] Please consult [email or web address] for contact info under     Signed, Theodore Demark, PA-C  12/21/2020 4:40 PM   Patient seen and examined and agree with Theodore Demark, PA-C as detailed above.  In brief, the patient is a 71 year old male with atrial fibrillation s/p ablation in 2015 with recurrence s/p failed DCCV 12/13/20, recent NSTEMI in 11/2020 s/p DES to LAD, and HFrEF  with LVEF 30-35% who presented to the ED with substernal chest pressure and shortness of breath after an argument with his son found to be in Afib. Cardiology consulted for concern for chest pain and recurrent Afib.  In the ER trop 27-->20. ECG with atrial fibrillation. Cr 1.0. CXR without significant pathology. BNP 336. Suspect symptoms driven by Afib with RVR with low suspicion for ischemia or stent thrombosis. No significant residual disease on cath from 11/2020. Currently chest pain has resolved. Patient is flipping in and out of Afib on the monitor but rates controlled in 90s when in Afib. He is mildly volume up on examination.   GEN: No acute distress.   Neck:  Mildly elevated JVD 10cm Cardiac: Irregular, no murmurs, rubs, or gallops.  Respiratory: Crackles at the bases GI: Soft, nontender, non-distended  MS: No edema; No deformity. Neuro:  Nonfocal  Psych: Normal affect   Plan: -Low suspicion for ischemic etiology of chest pain, likely related to Afib with suspected RVR and mild volume overload -Diurese with lasix 40mg  IV once; plan to discharge on short course of lasix 20mg  PO daily x3 days -Increase amiodarone back to 200mg  BID given recurrence of Afib today with plans to follow-up with in clinic -Continue metop and apixaban -Continue ASA, plavix and high dose statin -No indication for DCCV as keeps flipping in and out of Afib while here -If patient remains stable with HR <120s and is able to be weaned off oxygen, can be discharged home with close Cardiology follow-up (we will arrange) -Start spironolactone 12.5mg  daily upon discharge  , MD

## 2020-12-21 NOTE — ED Triage Notes (Signed)
BIB EMS from home for CP that started during argument with son at 1700 yesterday (12/20/20). Was seen 3/15 for CP that was MI, stent placed in cath by Dr Johney Frame. Had episodes of A fib RVR at that time. Has been NSR today. Lateral T wave inversion, but no STEMI today per EMS. ASA 324 and 4 x NTG tabs. CP still there 7/10 per patient feels like non radiating pressure in center of chest. +SOB.

## 2020-12-22 NOTE — ED Notes (Signed)
Pt d/c by MD and is provided w/ d.c instructions and follow up care, This RN told pt he had an taxi cab voucher, Pt refuse and states he spoke to his daughter and he said she is on the way. This RN wheel chair patient to the waiting room and told pt if she does not come in an hour we can use the taxi voucher, Pt agreed

## 2020-12-26 ENCOUNTER — Other Ambulatory Visit: Payer: Self-pay

## 2020-12-26 ENCOUNTER — Encounter: Payer: Self-pay | Admitting: Internal Medicine

## 2020-12-26 ENCOUNTER — Ambulatory Visit: Payer: Medicare HMO | Admitting: Internal Medicine

## 2020-12-26 ENCOUNTER — Ambulatory Visit (HOSPITAL_COMMUNITY): Payer: Medicare HMO | Admitting: Nurse Practitioner

## 2020-12-26 VITALS — BP 128/84 | HR 95 | Ht 67.0 in | Wt 133.2 lb

## 2020-12-26 DIAGNOSIS — I4819 Other persistent atrial fibrillation: Secondary | ICD-10-CM

## 2020-12-26 DIAGNOSIS — I428 Other cardiomyopathies: Secondary | ICD-10-CM

## 2020-12-26 DIAGNOSIS — I10 Essential (primary) hypertension: Secondary | ICD-10-CM | POA: Diagnosis not present

## 2020-12-26 MED ORDER — AMIODARONE HCL 200 MG PO TABS: 200.0000 mg | ORAL_TABLET | Freq: Every day | ORAL | 3 refills | Status: DC

## 2020-12-26 MED ORDER — TICAGRELOR 90 MG PO TABS: 90.0000 mg | ORAL_TABLET | Freq: Two times a day (BID) | ORAL | 3 refills | Status: DC

## 2020-12-26 MED ORDER — ASPIRIN EC 81 MG PO TBEC: 81.0000 mg | DELAYED_RELEASE_TABLET | Freq: Every day | ORAL | 0 refills | Status: AC

## 2020-12-26 NOTE — Progress Notes (Signed)
PCP: Barbie Banner, MD Primary Cardiologist: Dr Allyson Sabal Primary EP: Dr [name] is a 70 y.o. male who presents today for routine electrophysiology followup.  Since last being seen in our clinic, the patient reports doing reasonably well.  He has medicine refractory afib.  He is recently s/p PCI.  He has a diffuse skin rash which is his primary concern today.  He is unaware of his afib.   Today, he denies symptoms of palpitations, chest pain, shortness of breath,  lower extremity edema, dizziness, presyncope, or syncope.  The patient is otherwise without complaint today.   Past Medical History:  Diagnosis Date  . Anxiety   . Arthritis   . Chronic systolic CHF (congestive heart failure) (HCC)    a. Dx 09/2013 in setting of AF-RVR; EF 10-15%, possibly tachy-mediated. Cath 09/2013: normal cors. b. TEE: EF 20-25% by echo 10/2013.  Marland Kitchen Dyslipidemia (high LDL; low HDL)   . Elevated liver enzymes   . H/O hematuria   . History of inguinal hernia   . Hypertension   . NICM (nonischemic cardiomyopathy) (HCC)   . Non-compliance    a. Adm 09/2013 after having stopped his meds.  . Paroxysmal atrial fibrillation (HCC)   . Paroxysmal atrial flutter (HCC)    typical appearing  . Pseudoaneurysm following procedure (HCC)    a. RLE pseudoaneurysm after cath, spontaneously decreased in size, did not require compression.  . Sinus bradycardia    a. HR 40's in ER 11/2013 - digoxin stopped, Coreg decreased.  . Tobacco abuse    Past Surgical History:  Procedure Laterality Date  . ABLATION  06-13-14   PVI and CTI by Dr Johney Frame  . ATRIAL FIBRILLATION ABLATION N/A 06/13/2014   Procedure: ATRIAL FIBRILLATION ABLATION;  Surgeon: Gardiner Rhyme, MD;  Location: [facility] CATH LAB;  Service: Cardiovascular;  Laterality: N/A;  . CARDIOVERSION N/A 10/17/2013   Procedure: CARDIOVERSION;  Surgeon: Thurmon Fair, MD;  Location: [facility] ENDOSCOPY;  Service: Cardiovascular;  Laterality: N/A;  . CARDIOVERSION N/A 12/13/2020    Procedure: CARDIOVERSION;  Surgeon: Pricilla Riffle, MD;  Location: [facility] ENDOSCOPY;  Service: Cardiovascular;  Laterality: N/A;  . CHOLECYSTECTOMY    . CORONARY STENT INTERVENTION N/A 11/29/2020   Procedure: CORONARY STENT INTERVENTION;  Surgeon: Runell Gess, MD;  Location: [facility] INVASIVE CV LAB;  Service: Cardiovascular;  Laterality: N/A;  . HERNIA REPAIR  1992 & 2011  . INTRAVASCULAR IMAGING/OCT N/A 11/29/2020   Procedure: INTRAVASCULAR IMAGING/OCT;  Surgeon: Runell Gess, MD;  Location: [facility] INVASIVE CV LAB;  Service: Cardiovascular;  Laterality: N/A;  . LEFT AND RIGHT HEART CATHETERIZATION WITH CORONARY ANGIOGRAM N/A 10/13/2013   Procedure: LEFT AND RIGHT HEART CATHETERIZATION WITH CORONARY ANGIOGRAM;  Surgeon: Marykay Lex, MD;  Location: [facility] CATH LAB;  Service: Cardiovascular;  Laterality: N/A;  . LEFT HEART CATH AND CORONARY ANGIOGRAPHY N/A 11/29/2020   Procedure: LEFT HEART CATH AND CORONARY ANGIOGRAPHY;  Surgeon: Runell Gess, MD;  Location: [facility] INVASIVE CV LAB;  Service: Cardiovascular;  Laterality: N/A;  . PSEUDOANERYSM COMPRESSION  11/11/2013   Procedure: PSEUDOANERYSM COMPRESSION;  Surgeon: Pricilla Riffle, MD;  Location: [facility] CATH LAB;  Service: Cardiovascular;;  . TEE WITHOUT CARDIOVERSION N/A 10/17/2013   Procedure: TRANSESOPHAGEAL ECHOCARDIOGRAM (TEE);  Surgeon: Thurmon Fair, MD;  Location: [facility] ENDOSCOPY;  Service: Cardiovascular;  Laterality: N/A;  . TEE WITHOUT CARDIOVERSION N/A 06/12/2014   Procedure: TRANSESOPHAGEAL ECHOCARDIOGRAM (TEE);  Surgeon: Pricilla Riffle, MD;  Location: [facility] ENDOSCOPY;  Service: Cardiovascular;  Laterality: N/A;    ROS- all systems are reviewed and negatives except as per HPI above  Current Outpatient Medications  Medication Sig Dispense Refill  . albuterol (VENTOLIN HFA) 108 (90 Base) MCG/ACT inhaler 1 or 2 puffs up to 4 times a day as needed for wheezing    . ALPRAZolam (XANAX) 1 MG tablet Take 0.5-1 mg by mouth 4 (four) times daily as needed for  anxiety or sleep.    Marland Kitchen amiodarone (PACERONE) 200 MG tablet Take 1 tablet (200 mg total) by mouth 2 (two) times daily. 180 tablet 3  . aspirin EC 81 MG tablet Take 1 tablet (81 mg total) by mouth daily. Swallow whole. 30 tablet 3  . atorvastatin (LIPITOR) 80 MG tablet Take 1 tablet (80 mg total) by mouth daily. 90 tablet 3  . clopidogrel (PLAVIX) 75 MG tablet Take 1 tablet (75 mg total) by mouth daily with breakfast. 90 tablet 3  . ELIQUIS 5 MG TABS tablet TAKE 1 TABLET BY MOUTH TWICE A DAY 60 tablet 5  . gabapentin (NEURONTIN) 300 MG capsule Take 300 mg by mouth 3 (three) times daily.    . metoprolol succinate (TOPROL-XL) 100 MG 24 hr tablet Take 1 tablet (100 mg total) by mouth in the morning and at bedtime. 45 tablet 3  . nitroGLYCERIN (NITROSTAT) 0.4 MG SL tablet Place 1 tablet (0.4 mg total) under the tongue every 5 (five) minutes as needed for chest pain. 25 tablet 3  . spironolactone (ALDACTONE) 25 MG tablet Take 0.5 tablets (12.5 mg total) by mouth daily. 30 tablet 11  . furosemide (LASIX) 20 MG tablet Take 1 tablet (20 mg total) by mouth daily for 3 days. 3 tablet 0   No current facility-administered medications for this visit.    Physical Exam: Vitals:   12/26/20 0915  BP: 128/84  Pulse: 95  SpO2: 96%  Weight: 133 lb 3.2 oz (60.4 kg)  Height: 5\' 7"  (1.702 m)    GEN- The patient is well appearing, alert and oriented x 3 today.   Head- normocephalic, atraumatic Eyes-  Sclera clear, conjunctiva pink Ears- hearing intact Oropharynx- clear Lungs- Clear to ausculation bilaterally, normal work of breathing Heart- irregular rate and rhythm  GI- soft, NT, ND, + BS Extremities- no clubbing, cyanosis, or edema  Wt Readings from Last 3 Encounters:  12/26/20 133 lb 3.2 oz (60.4 kg)  12/21/20 134 lb 7.7 oz (61 kg)  12/20/20 136 lb (61.7 kg)    EKG tracing ordered today is personally reviewed and shows coarse afib  Assessment and Plan:  1. Afib/ atypical atrial flutter The  patient has symptomatic, recurrent  atrial fibrillation. he has failed medical therapy with amiodarone. Chads2vasc score is at least 5.  he is anticoagulated with eliquis . Therapeutic strategies for afib including rate control and repeat ablation were discussed in detail with the patient today. He is clear that he does not wish to proceed with ablation at this time.  Given triple OAC therapy, I would be reluctant to proceed currently, particularly within such close timing of his recent PCI.  Follow-up in AF clinic in 2 months  2. Acute systolic dysfunction Likely tachycardia mediated Hopefully will improve with rate control  3. CAD S/p recent PCI 11/28/20 He has a skin rash, likely due to plavix Stop plavix Start brilinta Stop ASA 4 weeks post PCI  4. HTN Stable No change required today  5. Tobacco Cessation is advised  He needs to follow closely with general cardiology.  Return to AF clinic in 2 months.  Hillis Range MD, [facility] 12/26/2020 9:38 AM

## 2020-12-26 NOTE — Patient Instructions (Addendum)
Medication Instructions:  Reduce your Amiodarone to 200 mg daily Stop your aspirin in 1 week (4/20 last dose)  Stop Plavix  Start Brilinta 90 mg two times a day (first dose tomorrow)  Your physician recommends that you continue on your current medications as directed. Please refer to the Current Medication list given to you today.  Labwork: None ordered.  Testing/Procedures: None ordered.  Follow-Up:  Your physician wants you to follow-up in: 2 months with the Afib Clinic. They will contact you to schedule.    You will receive a reminder letter in the mail two months in advance. If you don't receive a letter, please call our office to schedule the follow-up appointment.   Any Other Special Instructions Will Be Listed Below (If Applicable).  If you need a refill on your cardiac medications before your next appointment, please call your pharmacy.

## 2021-01-01 ENCOUNTER — Telehealth (HOSPITAL_COMMUNITY): Payer: Self-pay | Admitting: *Deleted

## 2021-01-01 NOTE — Telephone Encounter (Signed)
Patient called in with continued skin rash - pt states rash is improved slightly but still there especially on lower half of body he feels the upper half of body is improved. He switched plavix to brilinta end of last week. He will try taking zyrtec/claritin and benadryl call later in the week once off plavix for week for update. Per Jorja Loa PA if no improvement will hold spironolactone (started in ER 4/8) and see if this is culprit of rash. Pt in agreement.

## 2021-01-20 NOTE — Progress Notes (Signed)
Cardiology Clinic Note   Patient Name: Peter Mclean Date of Encounter: 01/22/2021  Primary Care Provider:  Barbie Banner, MD Primary Cardiologist:  Hillis Range, MD  Patient Profile    [name] 70 year old male presents the clinic today for follow-up evaluation of his nonischemic cardiomyopathy, persistent atrial fibrillation, and hypertension.  Past Medical History    Past Medical History:  Diagnosis Date  . Anxiety   . Arthritis   . Chronic systolic CHF (congestive heart failure) (HCC)    a. Dx 09/2013 in setting of AF-RVR; EF 10-15%, possibly tachy-mediated. Cath 09/2013: normal cors. b. TEE: EF 20-25% by echo 10/2013.  Marland Kitchen Dyslipidemia (high LDL; low HDL)   . Elevated liver enzymes   . H/O hematuria   . History of inguinal hernia   . Hypertension   . NICM (nonischemic cardiomyopathy) (HCC)   . Non-compliance    a. Adm 09/2013 after having stopped his meds.  . Paroxysmal atrial fibrillation (HCC)   . Paroxysmal atrial flutter (HCC)    typical appearing  . Pseudoaneurysm following procedure (HCC)    a. RLE pseudoaneurysm after cath, spontaneously decreased in size, did not require compression.  . Sinus bradycardia    a. HR 40's in ER 11/2013 - digoxin stopped, Coreg decreased.  . Tobacco abuse    Past Surgical History:  Procedure Laterality Date  . ABLATION  06-13-14   PVI and CTI by Dr Johney Frame  . ATRIAL FIBRILLATION ABLATION N/A 06/13/2014   Procedure: ATRIAL FIBRILLATION ABLATION;  Surgeon: Gardiner Rhyme, MD;  Location: [facility] CATH LAB;  Service: Cardiovascular;  Laterality: N/A;  . CARDIOVERSION N/A 10/17/2013   Procedure: CARDIOVERSION;  Surgeon: Thurmon Fair, MD;  Location: [facility] ENDOSCOPY;  Service: Cardiovascular;  Laterality: N/A;  . CARDIOVERSION N/A 12/13/2020   Procedure: CARDIOVERSION;  Surgeon: Pricilla Riffle, MD;  Location: [facility] ENDOSCOPY;  Service: Cardiovascular;  Laterality: N/A;  . CHOLECYSTECTOMY    . CORONARY STENT INTERVENTION N/A 11/29/2020   Procedure:  CORONARY STENT INTERVENTION;  Surgeon: Runell Gess, MD;  Location: [facility] INVASIVE CV LAB;  Service: Cardiovascular;  Laterality: N/A;  . HERNIA REPAIR  1992 & 2011  . INTRAVASCULAR IMAGING/OCT N/A 11/29/2020   Procedure: INTRAVASCULAR IMAGING/OCT;  Surgeon: Runell Gess, MD;  Location: [facility] INVASIVE CV LAB;  Service: Cardiovascular;  Laterality: N/A;  . LEFT AND RIGHT HEART CATHETERIZATION WITH CORONARY ANGIOGRAM N/A 10/13/2013   Procedure: LEFT AND RIGHT HEART CATHETERIZATION WITH CORONARY ANGIOGRAM;  Surgeon: Marykay Lex, MD;  Location: [facility] CATH LAB;  Service: Cardiovascular;  Laterality: N/A;  . LEFT HEART CATH AND CORONARY ANGIOGRAPHY N/A 11/29/2020   Procedure: LEFT HEART CATH AND CORONARY ANGIOGRAPHY;  Surgeon: Runell Gess, MD;  Location: [facility] INVASIVE CV LAB;  Service: Cardiovascular;  Laterality: N/A;  . PSEUDOANERYSM COMPRESSION  11/11/2013   Procedure: PSEUDOANERYSM COMPRESSION;  Surgeon: Pricilla Riffle, MD;  Location: [facility] CATH LAB;  Service: Cardiovascular;;  . TEE WITHOUT CARDIOVERSION N/A 10/17/2013   Procedure: TRANSESOPHAGEAL ECHOCARDIOGRAM (TEE);  Surgeon: Thurmon Fair, MD;  Location: [facility] ENDOSCOPY;  Service: Cardiovascular;  Laterality: N/A;  . TEE WITHOUT CARDIOVERSION N/A 06/12/2014   Procedure: TRANSESOPHAGEAL ECHOCARDIOGRAM (TEE);  Surgeon: Pricilla Riffle, MD;  Location: [facility] ENDOSCOPY;  Service: Cardiovascular;  Laterality: N/A;    Allergies  Allergies  Allergen Reactions  . Tramadol Nausea Only    History of Present Illness    [name] has a PMH of anxiety, chronic systolic CHF, dyslipidemia, hypertension, nonischemic cardiomyopathy, medication noncompliance,  persistent atrial fibrillation, and sinus bradycardia.  He underwent A. fib ablation 06/13/2014.  He underwent DCCV 12/13/2020.  He underwent cardiac catheterization 11/29/2020 which showed mid LAD stenosis 99%, proximal-mid LAD 50% stenosis.  He received PCI with DES x1.  He was noted to have mild-moderate LV  systolic dysfunction with LVEF of 45-50%.  His LVEDP was normal.  He was placed on DAPT.  He was seen by Dr. Johney Frame on 12/26/2020.  His Plavix was changed to Brilinta at that time due to a skin rash.  His EKG showed atrial fibrillation.  He is rate controlled.  His rash is gone today.  He reports that he feels well and now has some skin peeling on his hands and legs.  He reports that he does have some increased stress with his daughter and son-in-law living with him.  His wife also has recently passed away and he has not been eating as well.  I will give him any salty 6 diet sheet, repeat his echocardiogram, have him increase his physical activity as tolerated and follow-up with Dr. Allyson Sabal in 3 to 4 months.  Today.  Denies chest pain, shortness of breath, lower extremity edema, fatigue, palpitations, melena, hematuria, hemoptysis, diaphoresis, weakness, presyncope, syncope, orthopnea, and PND.     Home Medications    Prior to Admission medications   Medication Sig Start Date End Date Taking? Authorizing Provider  albuterol (VENTOLIN HFA) 108 (90 Base) MCG/ACT inhaler 1 or 2 puffs up to 4 times a day as needed for wheezing 10/18/20   [provider]  ALPRAZolam Prudy Feeler) 1 MG tablet Take 0.5-1 mg by mouth 4 (four) times daily as needed for anxiety or sleep.    [provider]  amiodarone (PACERONE) 200 MG tablet Take 1 tablet (200 mg total) by mouth daily. 12/26/20   Allred, Fayrene Fearing, MD  atorvastatin (LIPITOR) 80 MG tablet Take 1 tablet (80 mg total) by mouth daily. 11/30/20   Duke, Roe Rutherford, PA  ELIQUIS 5 MG TABS tablet TAKE 1 TABLET BY MOUTH TWICE A DAY 12/01/16   Allred, Fayrene Fearing, MD  furosemide (LASIX) 20 MG tablet Take 1 tablet (20 mg total) by mouth daily for 3 days. 12/21/20 12/24/20  Koleen Distance, MD  gabapentin (NEURONTIN) 300 MG capsule Take 300 mg by mouth 3 (three) times daily. 09/18/19   [provider]  metoprolol succinate (TOPROL-XL) 100 MG 24 hr tablet Take 1 tablet  (100 mg total) by mouth in the morning and at bedtime. 12/05/20 03/05/21  Newman Nip, NP  nitroGLYCERIN (NITROSTAT) 0.4 MG SL tablet Place 1 tablet (0.4 mg total) under the tongue every 5 (five) minutes as needed for chest pain. 11/30/20 11/30/21  Marcelino Duster, PA  spironolactone (ALDACTONE) 25 MG tablet Take 0.5 tablets (12.5 mg total) by mouth daily. 12/21/20 12/21/21  Barrett, Joline Salt, PA-C  ticagrelor (BRILINTA) 90 MG TABS tablet Take 1 tablet (90 mg total) by mouth 2 (two) times daily. 12/26/20   Hillis Range, MD    Family History    Family History  Problem Relation Age of Onset  . Hyperlipidemia Mother   . Hypertension Mother   . Heart attack Father    He indicated that his mother is alive. He indicated that his father is deceased. He indicated that his brother is alive. He indicated that his maternal grandmother is deceased. He indicated that his maternal grandfather is deceased. He indicated that his paternal grandmother is deceased. He indicated that his paternal grandfather is  deceased.  Social History    Social History   Socioeconomic History  . Marital status: Married    Spouse name: Not on file  . Number of children: Not on file  . Years of education: Not on file  . Highest education level: Not on file  Occupational History  . 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 Occupation:    . Occupation:    Tobacco Use  . Smoking status: Current Some Day Smoker    Packs/day: 0.50    Years: 40.00    Pack years: 20.00    Last attempt to quit: 06/07/2014    Years since quitting: 6.6  . Smokeless tobacco: Never Used  . Tobacco comment: Has cut back to 2-3 cigarettes per day.  Substance and Sexual Activity  . Alcohol use: Not Currently    Alcohol/week: 0.0 standard drinks    Comment: 1-2 A DAY  . Drug use: Never  . Sexual activity: Not on file  Other Topics Concern  . Not on file  Social History Narrative   Lives in [place]  with wife.    Social Determinants of Health   Financial Resource Strain: Not on file  Food Insecurity: Not on file  Transportation Needs: Not on file  Physical Activity: Not on file  Stress: Not on file  Social Connections: Not on file  Intimate Partner Violence: Not on file     Review of Systems    General:  No chills, fever, night sweats or weight changes.  Cardiovascular:  No chest pain, dyspnea on exertion, edema, orthopnea, palpitations, paroxysmal nocturnal dyspnea. Dermatological: No rash, lesions/masses Respiratory: No cough, dyspnea Urologic: No hematuria, dysuria Abdominal:   No nausea, vomiting, diarrhea, bright red blood per rectum, melena, or hematemesis Neurologic:  No visual changes, wkns, changes in mental status. All other systems reviewed and are otherwise negative except as noted above.  Physical Exam    VS:  BP 128/86 (BP Location: Left Arm, Patient Position: Sitting, Cuff Size: Normal)   Pulse 62   Ht 5\' 7"  (1.702 m)   Wt 133 lb 11.2 oz (60.6 kg)   SpO2 98%   BMI 20.94 kg/m  , BMI Body mass index is 20.94 kg/m. GEN: Well nourished, well developed, in no acute distress. HEENT: normal. Neck: Supple, no JVD, carotid bruits, or masses. Cardiac: RRR, no murmurs, rubs, or gallops. No clubbing, cyanosis, edema.  Radials/DP/PT 2+ and equal bilaterally.  Respiratory:  Respirations regular and unlabored, clear to auscultation bilaterally. GI: Soft, nontender, nondistended, BS + x 4. MS: no deformity or atrophy. Skin: warm and dry, no rash. Neuro:  Strength and sensation are intact. Psych: Normal affect.  Accessory Clinical Findings    Recent Labs: 11/27/2020: Magnesium 1.7 11/28/2020: TSH 2.451 12/21/2020: ALT 35; B Natriuretic Peptide 336.3; BUN 13; Creatinine, Ser 1.00; Hemoglobin 11.3; Platelets 203; Potassium 3.9; Sodium 138   Recent Lipid Panel    Component Value Date/Time   CHOL 152 11/28/2020 0107   TRIG 64 11/28/2020 0107   HDL 36 (L) 11/28/2020  0107   CHOLHDL 4.2 11/28/2020 0107   VLDL 13 11/28/2020 0107   LDLCALC 103 (H) 11/28/2020 0107    ECG personally reviewed by me today-none today.  Echocardiogram 11/29/2020 IMPRESSIONS    1. Left ventricular ejection fraction, by estimation, is 30 to 35%. The  left ventricle has moderately decreased function. The left ventricle  demonstrates global hypokinesis. Left ventricular diastolic function could  not be evaluated.  There is of the  left ventricular,.  2. Right ventricular systolic function is normal. The right ventricular  size is normal.  3. The mitral valve is normal in structure. Mild mitral valve  regurgitation.  4. The aortic valve is normal in structure. Aortic valve regurgitation is  not visualized. No aortic stenosis is present.  Cardiac catheterization 11/29/2020  Mid LAD lesion is 99% stenosed.  Prox LAD to Mid LAD lesion is 50% stenosed.  Post intervention, there is a 0% residual stenosis.  Post intervention, there is a 0% residual stenosis.  A drug-eluting stent was successfully placed using a STENT RESOLUTE ONYX 3.0X30.  There is mild to moderate left ventricular systolic dysfunction.  The left ventricular ejection fraction is 45-50% by visual estimate.  LV end diastolic pressure is normal.   Wissam Resor is a 70 y.o. male  Diagnostic Dominance: Right    Intervention      Assessment & Plan   1.  NSTEMI/coronary artery disease- no chest pain today.  Underwent cardiac catheterization on 11/29/2020 which showed proximal and mid LAD stenosis.  Received DES x1.  Details above.  Developed skin rash related to Plavix. Continue Brilinta, apixaban Heart healthy low-sodium diet-salty 6 given Increase physical activity as tolerated  Acute systolic dysfunction- euvolemic today.  Has returned to baseline physical activity.  Echocardiogram 11/29/2020 showed EF 30-35%.  Felt to be related to tachycardia.  Rate control recommended by  EP. Continue amiodarone, metoprolol, furosemide, spironolactone Heart healthy low-sodium diet-salty 6 given Increase physical activity as tolerated Repeat echocardiogram  Essential hypertension-BP today 128/86.  Well-controlled on. Continue metoprolol, spironolactone Heart healthy low-sodium diet-salty 6 given Increase physical activity as tolerated  Atrial fibrillation- Hr 62.  She followed up with EP 12/26/2020 and was cardiac unaware.  He failed medical therapy with amiodarone.  CHA2DS2-VASc score 5.  Reports compliance with apixaban and denies bleeding issues. He does not wish to proceed with repeat ablation procedure. Follows with EP  Disposition: Follow-up with Dr. Allyson Sabal in 3-4 months.  Thomasene Ripple. Tanisia Yokley NP-C    01/22/2021, 10:44 AM [facility] [address] Office [phone] Fax [phone]  Notice: This dictation was prepared with Dragon dictation along with smaller phrase technology. Any transcriptional errors that result from this process are unintentional and may not be corrected upon review.  I spent 14 minutes examining this patient, reviewing medications, and using patient centered shared decision making involving her cardiac care.  Prior to her visit I spent greater than 20 minutes reviewing her past medical history,  medications, and prior cardiac tests.

## 2021-01-22 ENCOUNTER — Encounter: Payer: Self-pay | Admitting: General Practice

## 2021-01-22 ENCOUNTER — Ambulatory Visit: Payer: Medicare HMO | Admitting: General Practice

## 2021-01-22 ENCOUNTER — Other Ambulatory Visit: Payer: Self-pay

## 2021-01-22 VITALS — BP 128/86 | HR 62 | Ht 67.0 in | Wt 133.7 lb

## 2021-01-22 DIAGNOSIS — I5023 Acute on chronic systolic (congestive) heart failure: Secondary | ICD-10-CM | POA: Diagnosis not present

## 2021-01-22 DIAGNOSIS — I10 Essential (primary) hypertension: Secondary | ICD-10-CM | POA: Diagnosis not present

## 2021-01-22 DIAGNOSIS — I2510 Atherosclerotic heart disease of native coronary artery without angina pectoris: Secondary | ICD-10-CM | POA: Diagnosis not present

## 2021-01-22 DIAGNOSIS — I214 Non-ST elevation (NSTEMI) myocardial infarction: Secondary | ICD-10-CM

## 2021-01-22 DIAGNOSIS — I4819 Other persistent atrial fibrillation: Secondary | ICD-10-CM

## 2021-01-22 MED ORDER — NITROGLYCERIN 0.4 MG SL SUBL: 0.4000 mg | SUBLINGUAL_TABLET | SUBLINGUAL | 3 refills | Status: DC | PRN

## 2021-01-22 NOTE — Patient Instructions (Signed)
Medication Instructions:  The current medical regimen is effective;  continue present plan and medications as directed. Please refer to the Current Medication list given to you today.  *If you need a refill on your cardiac medications before your next appointment, please call your pharmacy*  Lab Work: NONE  Testing/Procedures: Echocardiogram - Your physician has requested that you have an echocardiogram. Echocardiography is a painless test that uses sound waves to create images of your heart. It provides your doctor with information about the size and shape of your heart and how well your heart's chambers and valves are working. This procedure takes approximately one hour. There are no restrictions for this procedure. This will be performed at our [facility] location - [address].  Special Instructions PLEASE READ AND FOLLOW SALTY 6-ATTACHED-1,800 mg daily  PLEASE INCREASE PHYSICAL ACTIVITY AS TOLERATED  Follow-Up: Your next appointment:  3-4 month(s) In Person with Peter Batty, MD OR IF UNAVAILABLE Peter CLEAVER, FNP-C  At [facility], you and your health needs are our priority.  As part of our continuing mission to provide you with exceptional heart care, we have created designated Provider Care Teams.  These Care Teams include your primary Cardiologist (physician) and Advanced Practice Providers (APPs -  Physician Assistants and Nurse Practitioners) who all work together to provide you with the care you need, when you need it.

## 2021-02-15 ENCOUNTER — Other Ambulatory Visit: Payer: Self-pay

## 2021-02-15 ENCOUNTER — Ambulatory Visit (HOSPITAL_COMMUNITY): Payer: Medicare HMO | Attending: Cardiology

## 2021-02-15 DIAGNOSIS — I5023 Acute on chronic systolic (congestive) heart failure: Secondary | ICD-10-CM | POA: Diagnosis not present

## 2021-02-15 LAB — ECHOCARDIOGRAM COMPLETE: S' Lateral: 2.9 cm

## 2021-02-15 MED ORDER — METOPROLOL SUCCINATE ER 100 MG PO TB24: 100.0000 mg | ORAL_TABLET | Freq: Two times a day (BID) | ORAL | 3 refills | Status: DC

## 2021-02-18 ENCOUNTER — Other Ambulatory Visit: Payer: Self-pay

## 2021-02-18 MED ORDER — METOPROLOL SUCCINATE ER 100 MG PO TB24: 100.0000 mg | ORAL_TABLET | Freq: Two times a day (BID) | ORAL | 3 refills | Status: DC

## 2021-02-21 ENCOUNTER — Telehealth (HOSPITAL_COMMUNITY): Payer: Self-pay

## 2021-02-21 ENCOUNTER — Encounter (HOSPITAL_COMMUNITY): Payer: Self-pay

## 2021-02-21 NOTE — Telephone Encounter (Signed)
Attempted to call patient in regards to Cardiac Rehab - LM on VM Mailed letter 

## 2021-02-22 NOTE — Telephone Encounter (Signed)
Pt returned phone call in regards to cardiac rehab. Pt is not interested in the program. Closed referral

## 2021-03-14 ENCOUNTER — Other Ambulatory Visit: Payer: Self-pay

## 2021-03-14 ENCOUNTER — Encounter (HOSPITAL_COMMUNITY): Payer: Self-pay | Admitting: Nurse Practitioner

## 2021-03-14 ENCOUNTER — Ambulatory Visit (HOSPITAL_COMMUNITY)
Admission: RE | Admit: 2021-03-14 | Discharge: 2021-03-14 | Disposition: A | Discharge status: Home / Self Care | Payer: Medicare HMO | Source: Ambulatory Visit | Attending: Nurse Practitioner | Admitting: Nurse Practitioner

## 2021-03-14 VITALS — BP 134/62 | HR 55 | Ht 67.0 in | Wt 137.4 lb

## 2021-03-14 DIAGNOSIS — Z7901 Long term (current) use of anticoagulants: Secondary | ICD-10-CM | POA: Diagnosis not present

## 2021-03-14 DIAGNOSIS — D6869 Other thrombophilia: Secondary | ICD-10-CM | POA: Diagnosis not present

## 2021-03-14 DIAGNOSIS — Z716 Tobacco abuse counseling: Secondary | ICD-10-CM | POA: Insufficient documentation

## 2021-03-14 DIAGNOSIS — I5021 Acute systolic (congestive) heart failure: Secondary | ICD-10-CM | POA: Insufficient documentation

## 2021-03-14 DIAGNOSIS — Z8249 Family history of ischemic heart disease and other diseases of the circulatory system: Secondary | ICD-10-CM | POA: Diagnosis not present

## 2021-03-14 DIAGNOSIS — I214 Non-ST elevation (NSTEMI) myocardial infarction: Secondary | ICD-10-CM | POA: Insufficient documentation

## 2021-03-14 DIAGNOSIS — Z885 Allergy status to narcotic agent status: Secondary | ICD-10-CM | POA: Diagnosis not present

## 2021-03-14 DIAGNOSIS — I4819 Other persistent atrial fibrillation: Secondary | ICD-10-CM

## 2021-03-14 DIAGNOSIS — I4892 Unspecified atrial flutter: Secondary | ICD-10-CM | POA: Insufficient documentation

## 2021-03-14 DIAGNOSIS — I4891 Unspecified atrial fibrillation: Secondary | ICD-10-CM | POA: Diagnosis not present

## 2021-03-14 DIAGNOSIS — I110 Hypertensive heart disease with heart failure: Secondary | ICD-10-CM | POA: Diagnosis not present

## 2021-03-14 DIAGNOSIS — F17210 Nicotine dependence, cigarettes, uncomplicated: Secondary | ICD-10-CM | POA: Insufficient documentation

## 2021-03-14 MED ORDER — AMIODARONE HCL 200 MG PO TABS: 200.0000 mg | ORAL_TABLET | Freq: Every day | ORAL | 1 refills | Status: DC

## 2021-03-14 MED ORDER — METOPROLOL SUCCINATE ER 100 MG PO TB24
100.0000 mg | ORAL_TABLET | Freq: Every day | ORAL | Status: DC
Start: 2021-03-14 — End: 2021-09-23

## 2021-03-14 NOTE — Addendum Note (Signed)
Encounter addended by: Learta Codding, CMA on: 03/14/2021 3:50 PM  Actions taken: Order list changed, Pharmacy for encounter modified

## 2021-03-14 NOTE — Progress Notes (Signed)
Primary Care Physician: Barbie Banner, MD Referring Physician:[facility] f/u Cardiologist: Dr. [name] is a 70 y.o. male with a h/o paroxysmal afib, CAD, tobacco abuse,  s/p ablation by Dr. Johney Frame in the remote past, that was in the hospital  3/16 to 11/30/20 for a NSTEMI, receiving a stent of the LAD. He  presented in afib/flutter with RVR. He was started on amiodarone 200 mg daily (had some elevation of liver enzymes in the past so the loading  dose was kept low). He left the hospital in atrial flutter at 108 bpm.  He had an unsuccessful CV in the ER.   His EKG reads typical atrial flutter at 146 bpm, Acute Stemi,  but I  feel this is an erroneous reading  from the flutter waves .He  denies chest pain today and feels fine.  I also spoke to Dr. Johney Frame who reviewed the EKG and confirms the readout is erroneous and is secondary to the flutter waves. He is now on triple therapy, asa, plavix and eliquis.   Dr. Johney Frame suggests leaving Amiodarone  at 200 mg qd and increasing the metoprolol to 200 mg daily for better rate control.  F/u in afib clinic, 12/10/20. Pt was suppose to f/u last Friday, called in and said he could not come. His HR was still running 140 bpm. He is on max BB so decided to increase amiodarone to 200 mg bid. He today still shows STEMI( erroneous auto read)  with atrial flutter at 135 bpm. He states that he feels fine. I am concerned re the strain on his heart with RVR with a recent NSTEMI  and new stent of the LAD. I discussed proceeding with cardioversion and he is in agreement.  F/u 12/20/20. He is back in afib clinic, f/u unsuccessful cardioversion, despite amiodarone load.  However, he has now self  converted, possibly Saturday, as that is when he noted a slower heart rate. Physically, he does not feel any different.  I will refer back to Dr. Johney Frame to be considered for repeat ablation. He also did not receive any f/u appointment with cardiology for his CAD/NSTEMI/stent and  I will obtain an appointment with Dr. Allyson Sabal to establish.   F/u with afib clinic, 03/14/21. He went back to Dr. Johney Frame in April as he was in afib despite amiodarone. He did not think he was an ablation candidate. He was having a rash and Dr. Johney Frame stopped plavix and started Brilinta  without any further rash. He is in SR today.   Today, he denies symptoms of palpitations, chest pain, shortness of breath, orthopnea, PND, lower extremity edema, dizziness, presyncope, syncope, or neurologic sequela. The patient is tolerating medications without difficulties and is otherwise without complaint today.   Past Medical History:  Diagnosis Date   Anxiety    Arthritis    Chronic systolic CHF (congestive heart failure) (HCC)    a. Dx 09/2013 in setting of AF-RVR; EF 10-15%, possibly tachy-mediated. Cath 09/2013: normal cors. b. TEE: EF 20-25% by echo 10/2013.   Dyslipidemia (high LDL; low HDL)    Elevated liver enzymes    H/O hematuria    History of inguinal hernia    Hypertension    NICM (nonischemic cardiomyopathy) (HCC)    Non-compliance    a. Adm 09/2013 after having stopped his meds.   Paroxysmal atrial fibrillation (HCC)    Paroxysmal atrial flutter (HCC)    typical appearing   Pseudoaneurysm following procedure (HCC)  a. RLE pseudoaneurysm after cath, spontaneously decreased in size, did not require compression.   Sinus bradycardia    a. HR 40's in ER 11/2013 - digoxin stopped, Coreg decreased.   Tobacco abuse    Past Surgical History:  Procedure Laterality Date   ABLATION  06-13-14   PVI and CTI by Dr Johney Frame   ATRIAL FIBRILLATION ABLATION N/A 06/13/2014   Procedure: ATRIAL FIBRILLATION ABLATION;  Surgeon: Gardiner Rhyme, MD;  Location: [facility] CATH LAB;  Service: Cardiovascular;  Laterality: N/A;   CARDIOVERSION N/A 10/17/2013   Procedure: CARDIOVERSION;  Surgeon: Thurmon Fair, MD;  Location: [facility] ENDOSCOPY;  Service: Cardiovascular;  Laterality: N/A;   CARDIOVERSION N/A 12/13/2020   Procedure:  CARDIOVERSION;  Surgeon: Pricilla Riffle, MD;  Location: [facility] ENDOSCOPY;  Service: Cardiovascular;  Laterality: N/A;   CHOLECYSTECTOMY     CORONARY STENT INTERVENTION N/A 11/29/2020   Procedure: CORONARY STENT INTERVENTION;  Surgeon: Runell Gess, MD;  Location: [facility] INVASIVE CV LAB;  Service: Cardiovascular;  Laterality: N/A;   HERNIA REPAIR  1992 & 2011   INTRAVASCULAR IMAGING/OCT N/A 11/29/2020   Procedure: INTRAVASCULAR IMAGING/OCT;  Surgeon: Runell Gess, MD;  Location: [facility] INVASIVE CV LAB;  Service: Cardiovascular;  Laterality: N/A;   LEFT AND RIGHT HEART CATHETERIZATION WITH CORONARY ANGIOGRAM N/A 10/13/2013   Procedure: LEFT AND RIGHT HEART CATHETERIZATION WITH CORONARY ANGIOGRAM;  Surgeon: Marykay Lex, MD;  Location: [facility] CATH LAB;  Service: Cardiovascular;  Laterality: N/A;   LEFT HEART CATH AND CORONARY ANGIOGRAPHY N/A 11/29/2020   Procedure: LEFT HEART CATH AND CORONARY ANGIOGRAPHY;  Surgeon: Runell Gess, MD;  Location: [facility] INVASIVE CV LAB;  Service: Cardiovascular;  Laterality: N/A;   PSEUDOANERYSM COMPRESSION  11/11/2013   Procedure: PSEUDOANERYSM COMPRESSION;  Surgeon: Pricilla Riffle, MD;  Location: [facility] CATH LAB;  Service: Cardiovascular;;   TEE WITHOUT CARDIOVERSION N/A 10/17/2013   Procedure: TRANSESOPHAGEAL ECHOCARDIOGRAM (TEE);  Surgeon: Thurmon Fair, MD;  Location: [facility] ENDOSCOPY;  Service: Cardiovascular;  Laterality: N/A;   TEE WITHOUT CARDIOVERSION N/A 06/12/2014   Procedure: TRANSESOPHAGEAL ECHOCARDIOGRAM (TEE);  Surgeon: Pricilla Riffle, MD;  Location: [facility] ENDOSCOPY;  Service: Cardiovascular;  Laterality: N/A;    Current Outpatient Medications  Medication Sig Dispense Refill   albuterol (VENTOLIN HFA) 108 (90 Base) MCG/ACT inhaler 1 or 2 puffs up to 4 times a day as needed for wheezing     ALPRAZolam (XANAX) 1 MG tablet Take 0.5-1 mg by mouth 4 (four) times daily as needed for anxiety or sleep.     atorvastatin (LIPITOR) 80 MG tablet Take 1 tablet (80 mg total) by mouth  daily. 90 tablet 3   ELIQUIS 5 MG TABS tablet TAKE 1 TABLET BY MOUTH TWICE A DAY 60 tablet 5   gabapentin (NEURONTIN) 300 MG capsule Take 300 mg by mouth 3 (three) times daily.     metoprolol succinate (TOPROL-XL) 100 MG 24 hr tablet Take 1 tablet (100 mg total) by mouth in the morning and at bedtime. (Patient taking differently: Take 100 mg by mouth daily.) 180 tablet 3   nitroGLYCERIN (NITROSTAT) 0.4 MG SL tablet Place 1 tablet (0.4 mg total) under the tongue every 5 (five) minutes as needed for chest pain. 25 tablet 3   spironolactone (ALDACTONE) 25 MG tablet Take 0.5 tablets (12.5 mg total) by mouth daily. 30 tablet 11   tamsulosin (FLOMAX) 0.4 MG CAPS capsule Take 1 capsule daily to improve bladder function     ticagrelor (BRILINTA) 90 MG TABS tablet Take 1 tablet (  90 mg total) by mouth 2 (two) times daily. 180 tablet 3   amiodarone (PACERONE) 200 MG tablet Take 1 tablet (200 mg total) by mouth daily. 90 tablet 1   No current facility-administered medications for this encounter.    Allergies  Allergen Reactions   Tramadol Nausea Only   Clopidogrel Dermatitis    Social History   Socioeconomic History   Marital status: Married    Spouse name: Not on file   Number of children: Not on file   Years of education: Not on file   Highest education level: Not on file  Occupational History   Occupation: [employer] in [place]    Comment: Engineer, petroleum   Occupation:     Occupation:    Tobacco Use   Smoking status: Some Days    Packs/day: 0.50    Years: 40.00    Pack years: 20.00    Types: Cigarettes    Last attempt to quit: 06/07/2014    Years since quitting: 6.7   Smokeless tobacco: Never   Tobacco comments:    Has cut back to 2-3 cigarettes per day.  Substance and Sexual Activity   Alcohol use: Not Currently    Alcohol/week: 0.0 standard drinks    Comment: 1-2 A DAY   Drug use: Never   Sexual activity: Not on file  Other Topics Concern   Not on file  Social  History Narrative   Lives in [place] with wife.    Social Determinants of Health   Financial Resource Strain: Not on file  Food Insecurity: Not on file  Transportation Needs: Not on file  Physical Activity: Not on file  Stress: Not on file  Social Connections: Not on file  Intimate Partner Violence: Not on file    Family History  Problem Relation Age of Onset   Hyperlipidemia Mother    Hypertension Mother    Heart attack Father     ROS- All systems are reviewed and negative except as per the HPI above  Physical Exam: Vitals:   03/14/21 1356  BP: 134/62  Pulse: (!) 55  Weight: 62.3 kg  Height: 5\' 7"  (1.702 m)   Wt Readings from Last 3 Encounters:  03/14/21 62.3 kg  01/22/21 60.6 kg  12/26/20 60.4 kg    Labs: Lab Results  Component Value Date   NA 138 12/21/2020   K 3.9 12/21/2020   CL 105 12/21/2020   CO2 27 12/21/2020   GLUCOSE 88 12/21/2020   BUN 13 12/21/2020   CREATININE 1.00 12/21/2020   CALCIUM 8.3 (L) 12/21/2020   MG 1.7 11/27/2020   Lab Results  Component Value Date   INR 1.11 07/08/2016   Lab Results  Component Value Date   CHOL 152 11/28/2020   HDL 36 (L) 11/28/2020   LDLCALC 103 (H) 11/28/2020   TRIG 64 11/28/2020     GEN- The patient is well appearing, alert and oriented x 3 today.   Head- normocephalic, atraumatic Eyes-  Sclera clear, conjunctiva pink Ears- hearing intact Oropharynx- clear Neck- supple, no JVP Lymph- no cervical lymphadenopathy Lungs- Clear to ausculation bilaterally, normal work of breathing Heart-  regular rate and rhythm, no murmurs, rubs or gallops, PMI not laterally displaced GI- soft, NT, ND, + BS Extremities- no clubbing, cyanosis, or edema MS- no significant deformity or atrophy Skin- no rash or lesion Psych- euthymic mood, full affect Neuro- strength and sensation are intact  EKG-  Sinus brady at 55 bpm, IRBBB, LAPB, pr int 204  ms, qrs int 104 ms, qtc     Assessment and Plan: 1.  Afib/flutter with RVR Unsuccessful cardioversion 12/21/20 Self converted couple days after CV, possibly from  amio load finally being therapeutic  Continues in SR Continue amiodarone  200 mg daily    2. NSTEMI 11/28/20 Received a stent to proximal LAD Troponin elevation  out of proportion to the RVR on presentation Underwent LHC and received a  stent to  the LAD  Continue asa, brilinta   Had a significant rash with plavix   3. CHA2DS2VASc score of 5 Continue  eliquis 5 mg bid   4. Acute  systolic HF EF was 30-35%  Will need repeat echo several months from now in SR to see if EF improves   5. HTN Stable  6. Tobacco abuse Smoking cessation encouraged   F/u with Dr. Allyson Sabal 04/26/21 Afib clinic as needed   Lupita Leash [facility] [address] [place], [place] [address] [phone]

## 2021-03-23 ENCOUNTER — Other Ambulatory Visit (HOSPITAL_COMMUNITY): Payer: Self-pay | Admitting: Nurse Practitioner

## 2021-03-27 ENCOUNTER — Encounter: Payer: Self-pay | Admitting: Internal Medicine

## 2021-03-27 NOTE — Telephone Encounter (Signed)
error 

## 2021-04-15 ENCOUNTER — Other Ambulatory Visit (HOSPITAL_COMMUNITY): Payer: Self-pay | Admitting: Nurse Practitioner

## 2021-04-16 ENCOUNTER — Other Ambulatory Visit: Payer: Self-pay | Admitting: Family Medicine

## 2021-04-16 DIAGNOSIS — M792 Neuralgia and neuritis, unspecified: Secondary | ICD-10-CM

## 2021-04-16 DIAGNOSIS — M5442 Lumbago with sciatica, left side: Secondary | ICD-10-CM

## 2021-04-16 DIAGNOSIS — M5431 Sciatica, right side: Secondary | ICD-10-CM

## 2021-04-26 ENCOUNTER — Ambulatory Visit: Payer: Medicare HMO | Admitting: Cardiovascular Disease

## 2021-04-27 ENCOUNTER — Other Ambulatory Visit: Payer: Self-pay

## 2021-04-27 ENCOUNTER — Ambulatory Visit
Admission: RE | Admit: 2021-04-27 | Discharge: 2021-04-27 | Disposition: A | Discharge status: Home / Self Care | Payer: Medicare HMO | Source: Ambulatory Visit | Attending: Family Medicine | Admitting: Family Medicine

## 2021-04-27 DIAGNOSIS — M5431 Sciatica, right side: Secondary | ICD-10-CM

## 2021-04-27 DIAGNOSIS — M5442 Lumbago with sciatica, left side: Secondary | ICD-10-CM

## 2021-04-27 DIAGNOSIS — M792 Neuralgia and neuritis, unspecified: Secondary | ICD-10-CM

## 2021-06-24 ENCOUNTER — Other Ambulatory Visit: Payer: Self-pay | Admitting: Urology

## 2021-06-25 LAB — PSA: Prostate Specific Ag, Serum: 2 ng/mL (ref 0.0–4.0)

## 2021-08-02 ENCOUNTER — Ambulatory Visit: Payer: Medicare HMO | Admitting: Cardiovascular Disease

## 2021-09-11 ENCOUNTER — Emergency Department (HOSPITAL_COMMUNITY)
Admission: EM | Admit: 2021-09-11 | Discharge: 2021-09-11 | Disposition: A | Discharge status: Home / Self Care | Payer: Medicare HMO | Attending: Emergency Medicine | Admitting: Emergency Medicine

## 2021-09-11 ENCOUNTER — Other Ambulatory Visit: Payer: Self-pay

## 2021-09-11 ENCOUNTER — Encounter (HOSPITAL_COMMUNITY): Payer: Self-pay

## 2021-09-11 DIAGNOSIS — I110 Hypertensive heart disease with heart failure: Secondary | ICD-10-CM | POA: Diagnosis not present

## 2021-09-11 DIAGNOSIS — F17210 Nicotine dependence, cigarettes, uncomplicated: Secondary | ICD-10-CM | POA: Insufficient documentation

## 2021-09-11 DIAGNOSIS — M5441 Lumbago with sciatica, right side: Secondary | ICD-10-CM | POA: Insufficient documentation

## 2021-09-11 DIAGNOSIS — Z79899 Other long term (current) drug therapy: Secondary | ICD-10-CM | POA: Insufficient documentation

## 2021-09-11 DIAGNOSIS — Z7901 Long term (current) use of anticoagulants: Secondary | ICD-10-CM | POA: Diagnosis not present

## 2021-09-11 DIAGNOSIS — I5022 Chronic systolic (congestive) heart failure: Secondary | ICD-10-CM | POA: Insufficient documentation

## 2021-09-11 DIAGNOSIS — M545 Low back pain, unspecified: Secondary | ICD-10-CM | POA: Diagnosis present

## 2021-09-11 DIAGNOSIS — M5431 Sciatica, right side: Secondary | ICD-10-CM

## 2021-09-11 MED ORDER — DICLOFENAC SODIUM 1 % EX GEL: 2.0000 g | Freq: Four times a day (QID) | CUTANEOUS | 0 refills | Status: DC

## 2021-09-11 MED ORDER — KETOROLAC TROMETHAMINE 60 MG/2ML IM SOLN
30.0000 mg | Freq: Once | INTRAMUSCULAR | Status: AC
  Administered 2021-09-11: 18:00:00 30 mg via INTRAMUSCULAR
  Filled 2021-09-11: qty 2

## 2021-09-11 MED ORDER — METHOCARBAMOL 500 MG PO TABS: 500.0000 mg | ORAL_TABLET | Freq: Two times a day (BID) | ORAL | 0 refills | Status: AC

## 2021-09-11 MED ORDER — ACETAMINOPHEN 325 MG PO TABS
650.0000 mg | ORAL_TABLET | Freq: Once | ORAL | Status: AC
  Administered 2021-09-11: 18:00:00 650 mg via ORAL
  Filled 2021-09-11: qty 2

## 2021-09-11 MED ORDER — LIDOCAINE 5 % EX PTCH
1.0000 | MEDICATED_PATCH | CUTANEOUS | Status: DC
  Administered 2021-09-11: 18:00:00 1 via TRANSDERMAL
  Filled 2021-09-11: qty 1

## 2021-09-11 MED ORDER — LIDOCAINE 5 % EX PTCH: 1.0000 | MEDICATED_PATCH | CUTANEOUS | 0 refills | Status: DC

## 2021-09-11 MED ORDER — METHOCARBAMOL 500 MG PO TABS
1000.0000 mg | ORAL_TABLET | Freq: Once | ORAL | Status: AC
  Administered 2021-09-11: 18:00:00 1000 mg via ORAL
  Filled 2021-09-11: qty 2

## 2021-09-11 NOTE — ED Triage Notes (Addendum)
Pt BIB EMS from home. Pt has a diagnosed herniated disc between L2 and L3. Pt is waiting for surgery. Pt is here for pain control.  EMS gave 100 mcg of fentanyl 20G L FA

## 2021-09-11 NOTE — ED Notes (Signed)
Pt was able to walk around the room unassisted with a steady gait.

## 2021-09-11 NOTE — Discharge Instructions (Addendum)
You may continue taking your prescription pain medications at home as prescribed.  You will be prescribed Robaxin for use at home.  Do not drive or operate heavy machinery with this medication.  You will be given a prescription for Voltaren gel, apply as prescribed to your lower back.  You will be sent home with prescription for a Lidoderm patch.  It is important that you remove the patch after 12 hours and put on a new patch.  Follow-up with your primary care provider as needed.  Call your neurosurgeon, Dr. Franky Macho and set up a follow-up appointment regarding today's ED visit.  Return to the ED if you are experiencing increasing/worsening pain, inability to walk, or worsening symptoms.

## 2021-09-11 NOTE — ED Notes (Signed)
An After Visit Summary was printed and given to the patient. Discharge instructions given and no further questions at this time.  Pt leaving with his daughter. Pt able to ambulate unassisted.

## 2021-09-11 NOTE — ED Provider Notes (Signed)
[name] DEPT Provider Note   CSN: [id number] Arrival date & time: 09/11/21  1503     History Chief Complaint  Patient presents with   Back Pain    Peter Mclean is a 70 y.o. male with a PMHx of back pain who presents to the ED brought in by EMS complaining of lower back pain since March 2022.  Patient reports his back pain began to worsen over 1 month ago.  Patient has been evaluated by his primary care provider and neurosurgeon.  Per patient his neurosurgeon recommended either injections or surgery to alleviate patient's symptoms.  Patient declined injections at that time. Denies any new injury or trauma to back. Has tried prescription pain medication at home with no relief of his symptoms.  Denies bowel/bladder incontinence, fever, chills, abodminal pain, vomiting, dysuria, hematuria, cauda equina. Denies IVDU.   Per pt chart review: Patient was evaluated by [facility] by Dr. Ashok Pall on 05/31/2021.  At that time patient reported having back pain symptoms for 1-2 years with sciatica symptoms to the left lower extremity.    The history is provided by the patient. No language interpreter was used.      Past Medical History:  Diagnosis Date   Anxiety    Arthritis    Chronic systolic CHF (congestive heart failure) ([place])    a. Dx 09/2013 in setting of AF-RVR; EF 10-15%, possibly tachy-mediated. Cath 09/2013: normal cors. b. TEE: EF 20-25% by echo 10/2013.   Dyslipidemia (high LDL; low HDL)    Elevated liver enzymes    H/O hematuria    History of inguinal hernia    Hypertension    NICM (nonischemic cardiomyopathy) (HCC)    Non-compliance    a. Adm 09/2013 after having stopped his meds.   Paroxysmal atrial fibrillation (HCC)    Paroxysmal atrial flutter (HCC)    typical appearing   Pseudoaneurysm following procedure ([place])    a. RLE pseudoaneurysm after cath, spontaneously decreased in size, did not  require compression.   Sinus bradycardia    a. HR 40's in ER 11/2013 - digoxin stopped, Coreg decreased.   Tobacco abuse     Patient Active Problem List   Diagnosis Date Noted   Non-ST elevation (NSTEMI) myocardial infarction [facility])    Atrial fibrillation with rapid ventricular response ([place]) 11/27/2020   Atrial fibrillation ([place]) [id number]   Chronic systolic CHF (congestive heart failure) ([place]) 12/13/2013   Sinus bradycardia 12/13/2013   Postoperative groin pseudoaneurysm 11/17/2013   Tobacco abuse    NICM (nonischemic cardiomyopathy) ([place]) 10/14/2013   Non compliance with medical treatment- stopped meds prior to adm 10/14/2013   Dyslipidemia (high LDL; low HDL)    Persistent atrial fibrillation ([place]) 10/12/2013   HTN (hypertension) 10/12/2013   Paroxysmal atrial flutter ([place]) 05/24/2009   Anxiety state 05/23/2009   ARTHRITIS 05/23/2009   HEMATURIA, HX OF 05/23/2009   INGUINAL HERNIA, HX OF 05/23/2009    Past Surgical History:  Procedure Laterality Date   ABLATION  06-13-14   PVI and CTI by Dr Rayann Heman   ATRIAL FIBRILLATION ABLATION N/A 06/13/2014   Procedure: ATRIAL FIBRILLATION ABLATION;  Surgeon: Coralyn Mark, MD;  Location: [place] CATH LAB;  Service: Cardiovascular;  Laterality: N/A;   CARDIOVERSION N/A 10/17/2013   Procedure: CARDIOVERSION;  Surgeon: Sanda Klein, MD;  Location: [place] ENDOSCOPY;  Service: Cardiovascular;  Laterality: N/A;   CARDIOVERSION N/A 12/13/2020   Procedure: CARDIOVERSION;  Surgeon: Fay Records, MD;  Location:  [facility] ENDOSCOPY;  Service: Cardiovascular;  Laterality: N/A;   CHOLECYSTECTOMY     CORONARY STENT INTERVENTION N/A 11/29/2020   Procedure: CORONARY STENT INTERVENTION;  Surgeon: Runell Gess, MD;  Location: [facility] INVASIVE CV LAB;  Service: Cardiovascular;  Laterality: N/A;   HERNIA REPAIR  1992 & 2011   INTRAVASCULAR IMAGING/OCT N/A 11/29/2020   Procedure: INTRAVASCULAR IMAGING/OCT;  Surgeon: Runell Gess, MD;   Location: [facility] INVASIVE CV LAB;  Service: Cardiovascular;  Laterality: N/A;   LEFT AND RIGHT HEART CATHETERIZATION WITH CORONARY ANGIOGRAM N/A 10/13/2013   Procedure: LEFT AND RIGHT HEART CATHETERIZATION WITH CORONARY ANGIOGRAM;  Surgeon: Marykay Lex, MD;  Location: [facility] CATH LAB;  Service: Cardiovascular;  Laterality: N/A;   LEFT HEART CATH AND CORONARY ANGIOGRAPHY N/A 11/29/2020   Procedure: LEFT HEART CATH AND CORONARY ANGIOGRAPHY;  Surgeon: Runell Gess, MD;  Location: [facility] INVASIVE CV LAB;  Service: Cardiovascular;  Laterality: N/A;   PSEUDOANERYSM COMPRESSION  11/11/2013   Procedure: PSEUDOANERYSM COMPRESSION;  Surgeon: Pricilla Riffle, MD;  Location: [facility] CATH LAB;  Service: Cardiovascular;;   TEE WITHOUT CARDIOVERSION N/A 10/17/2013   Procedure: TRANSESOPHAGEAL ECHOCARDIOGRAM (TEE);  Surgeon: Thurmon Fair, MD;  Location: [facility] ENDOSCOPY;  Service: Cardiovascular;  Laterality: N/A;   TEE WITHOUT CARDIOVERSION N/A 06/12/2014   Procedure: TRANSESOPHAGEAL ECHOCARDIOGRAM (TEE);  Surgeon: Pricilla Riffle, MD;  Location: [facility] ENDOSCOPY;  Service: Cardiovascular;  Laterality: N/A;       Family History  Problem Relation Age of Onset   Hyperlipidemia Mother    Hypertension Mother    Heart attack Father     Social History   Tobacco Use   Smoking status: Some Days    Packs/day: 0.50    Years: 40.00    Pack years: 20.00    Types: Cigarettes    Last attempt to quit: 06/07/2014    Years since quitting: 7.2   Smokeless tobacco: Never   Tobacco comments:    Has cut back to 2-3 cigarettes per day.  Substance Use Topics   Alcohol use: Not Currently    Alcohol/week: 0.0 standard drinks    Comment: 1-2 A DAY   Drug use: Never    Home Medications Prior to Admission medications   Medication Sig Start Date End Date Taking? Authorizing Provider  diclofenac Sodium (VOLTAREN) 1 % GEL Apply 2 g topically 4 (four) times daily. 09/11/21  Yes Priscella Donna A, PA-C  lidocaine (LIDODERM) 5 %  Place 1 patch onto the skin daily. Remove & Discard patch within 12 hours or as directed by MD 09/11/21  Yes Yvaine Jankowiak A, PA-C  methocarbamol (ROBAXIN) 500 MG tablet Take 1 tablet (500 mg total) by mouth 2 (two) times daily for 5 days. 09/11/21 09/16/21 Yes Heidy Mccubbin A, PA-C  albuterol (VENTOLIN HFA) 108 (90 Base) MCG/ACT inhaler 1 or 2 puffs up to 4 times a day as needed for wheezing 10/18/20   [provider]  ALPRAZolam Prudy Feeler) 1 MG tablet Take 0.5-1 mg by mouth 4 (four) times daily as needed for anxiety or sleep.    [provider]  amiodarone (PACERONE) 200 MG tablet Take 1 tablet (200 mg total) by mouth daily. 03/14/21   Newman Nip, NP  atorvastatin (LIPITOR) 80 MG tablet Take 1 tablet (80 mg total) by mouth daily. 11/30/20   Duke, Roe Rutherford, PA  ELIQUIS 5 MG TABS tablet TAKE 1 TABLET BY MOUTH TWICE A DAY 12/01/16   Allred, Fayrene Fearing, MD  gabapentin (NEURONTIN) 300 MG capsule Take  300 mg by mouth 3 (three) times daily. 09/18/19   [provider]  metoprolol succinate (TOPROL-XL) 100 MG 24 hr tablet Take 1 tablet (100 mg total) by mouth daily. 03/14/21   Sherran Needs, NP  nitroGLYCERIN (NITROSTAT) 0.4 MG SL tablet Place 1 tablet (0.4 mg total) under the tongue every 5 (five) minutes as needed for chest pain. 01/22/21 01/22/22  Deberah Pelton, NP  spironolactone (ALDACTONE) 25 MG tablet Take 0.5 tablets (12.5 mg total) by mouth daily. 12/21/20 12/21/21  Barrett, Evelene Croon, PA-C  tamsulosin (FLOMAX) 0.4 MG CAPS capsule Take 1 capsule daily to improve bladder function 02/27/21   [provider]  ticagrelor (BRILINTA) 90 MG TABS tablet Take 1 tablet (90 mg total) by mouth 2 (two) times daily. 12/26/20   Allred, Jeneen Rinks, MD    Allergies    Tramadol and Clopidogrel  Review of Systems   Review of Systems  Constitutional:  Negative for chills and fever.  Gastrointestinal:  Negative for abdominal pain, nausea and vomiting.       -Bowel incontinence   Genitourinary:  Negative for dysuria and hematuria.       -Bladder incontinence  Musculoskeletal:  Positive for back pain.  Neurological:        -Saddle paresthesia  All other systems reviewed and are negative.  Physical Exam Updated Vital Signs BP (!) 149/86    Pulse (!) 59    Temp 98.1 F (36.7 C) (Oral)    Resp 18    SpO2 99%   Physical Exam Vitals and nursing note reviewed.  Constitutional:      General: He is not in acute distress.    Appearance: He is not diaphoretic.  HENT:     Head: Normocephalic and atraumatic.     Mouth/Throat:     Pharynx: No oropharyngeal exudate.  Eyes:     General: No scleral icterus.    Conjunctiva/sclera: Conjunctivae normal.  Cardiovascular:     Rate and Rhythm: Normal rate and regular rhythm.     Pulses: Normal pulses.     Heart sounds: Normal heart sounds.  Pulmonary:     Effort: Pulmonary effort is normal. No respiratory distress.     Breath sounds: Normal breath sounds. No wheezing.  Abdominal:     General: Bowel sounds are normal.     Palpations: Abdomen is soft. There is no mass.     Tenderness: There is no abdominal tenderness. There is no guarding or rebound.  Musculoskeletal:     Cervical back: Normal, normal range of motion and neck supple.     Thoracic back: Normal.     Lumbar back: Tenderness present. No bony tenderness. Normal range of motion. Positive right straight leg raise test. Negative left straight leg raise test.     Comments: Tenderness to palpation to bilateral lumbar musculature.  No C, T, L, S spinal tenderness to palpation.  No overlying skin changes.  Positive straight leg raise on the right.  Able to ambulate without assistance or difficulty.  Strength and sensation intact to bilateral lower extremities.  Radial, DP, PT pulses intact bilaterally.  Skin:    General: Skin is warm and dry.  Neurological:     Mental Status: He is alert.  Psychiatric:        Behavior: Behavior normal.    ED Results / Procedures  / Treatments   Labs (all labs ordered are listed, but only abnormal results are displayed) Labs Reviewed - No data to display  EKG None  Radiology No results found.  Procedures Procedures   Medications Ordered in ED Medications  lidocaine (LIDODERM) 5 % 1 patch (1 patch Transdermal Patch Applied 09/11/21 1753)  methocarbamol (ROBAXIN) tablet 1,000 mg (1,000 mg Oral Given 09/11/21 1746)  ketorolac (TORADOL) injection 30 mg (30 mg Intramuscular Given 09/11/21 1753)  acetaminophen (TYLENOL) tablet 650 mg (650 mg Oral Given 09/11/21 1749)    ED Course  I have reviewed the triage vital signs and the nursing notes.  Pertinent labs & imaging results that were available during my care of the patient were reviewed by me and considered in my medical decision making (see chart for details).  Clinical Course as of 09/11/21 2020  Wed Sep 11, 2021  1740 Discussed with attending who agrees with multimodal pain management control.  Discussed with patient who is agreeable at this time. [SB]  1852 Discussed with attending who agrees with ambulation and discharge home with medications. [SB]  [id number] Discussed with patient treatment plan to ambulate and discharge home with prescription medications and follow-up with his primary care provider and neurosurgeon. [SB]  2006 Patient ambulated without assistance or difficulty.  Discussed discharge treatment plan with patient.  Patient agreeable.  Patient appears safe for discharge at this time. [SB]    Clinical Course User Index [SB] Selah Klang A, PA-C   MDM Rules/Calculators/A&P                         Patient with lumbar back pain with radiation down posterior RLE. Pt with history of herniated disc.  Patient evaluated by primary care provider and neurosurgeon recently on 05/31/2021.  Patient has been taking oxycodone at home for his symptoms.  Patient was given 100 mcg of fentanyl by EMS while in route to the ED.  Vital signs stable, patient afebrile,  oxygen saturation at 98%.  No neurological deficits and normal neuro exam. Patient is ambulatory without assistance or difficulty.  No loss of bowel or bladder control.  No concern for cauda equina.  No fever, night sweats, weight loss, h/o cancer, IVDA, no recent procedure to back. No urinary symptoms suggestive of UTI.  On exam, patient with tenderness to palpation to bilateral lumbar musculature.  No spinal tenderness to palpation. No concerning findings on exams. Differential diagnosis muscle strain, sciatica, UTI, kidney stone, cauda equina, fracture.   Patient has had thorough work-up of MRI of the spine in August 2022  with neurosurgery follow up in 05/31/2021. Denies any recent injury, trauma, heavy lifting since then.  At this time no imaging is indicated due to absence of red flag symptoms.  Patient given Toradol, Robaxin, Lidoderm patch while in the ED with minimal relief of his symptoms.  Case discussed with attending who agrees with treatment plan.  Discussed with attending we will have patient follow-up with neurosurgeon and primary care provider in office.  Attending agreeable to discharge treatment plan.  Will send home prescription for Voltaren gel, Robaxin, and Lidoderm patch.  Discussed importance of follow-up with primary care provider and neurosurgeon for further management of symptoms.  Supportive care and strict return precautions discussed.  Appears safe for discharge at this time.  Follow-up as indicated in discharge paperwork.  Final Clinical Impression(s) / ED Diagnoses Final diagnoses:  Sciatica of right side    Rx / DC Orders ED Discharge Orders          Ordered    methocarbamol (ROBAXIN) 500 MG tablet  2 times  daily        09/11/21 2007    diclofenac Sodium (VOLTAREN) 1 % GEL  4 times daily        09/11/21 2007    lidocaine (LIDODERM) 5 %  Every 24 hours        09/11/21 2007             Shahin Knierim, Ellison Carwin 09/11/21 2014    Godfrey Pick, MD 09/12/21  [phone]

## 2021-09-18 ENCOUNTER — Telehealth: Payer: Self-pay | Admitting: *Deleted

## 2021-09-18 NOTE — Telephone Encounter (Signed)
° °  Name: Peter Mclean  DOB: 1951-01-09  MRN: [id number]  Primary Cardiologist: Hillis Range, MD  Chart reviewed as part of pre-operative protocol coverage. Because of Peter Mclean's past medical history and time since last visit, he will require a follow-up visit in order to better assess preoperative cardiovascular risk.  Pre-op covering staff: - Please schedule appointment and call patient to inform them. If patient already had an upcoming appointment within acceptable timeframe, please add "pre-op clearance" to the appointment notes so provider is aware. - Please contact requesting surgeon's office via preferred method (i.e, phone, fax) to inform them of need for appointment prior to surgery.  If applicable, this message will also be routed to pharmacy pool and/or primary cardiologist for input on holding anticoagulant/antiplatelet agent as requested below so that this information is available to the clearing provider at time of patient's appointment.   [place], [place]  09/18/2021, 4:29 PM

## 2021-09-18 NOTE — Telephone Encounter (Signed)
I will send a message to EP scheduler [place] to please reach out to the pt with an appt with Dr. Rayann Heman or EP APP for pre op clearance.

## 2021-09-18 NOTE — Telephone Encounter (Signed)
° °  Pre-operative Risk Assessment    Patient Name: Peter Mclean  DOB: May 23, 1951 MRN: [id number]      Request for Surgical Clearance    Procedure:   L4-5 MINIMALLY INVASIVE DECOMPRESSION  Date of Surgery:  Clearance TBD                                 Surgeon:  DR. Autumn Patty Surgeon's Group or Practice Name:  [facility] Phone number:  [phone] Fax number:  [phone] ATTN: JESSICA   Type of Clearance Requested:   - Medical  - Pharmacy:  Hold Apixaban (Eliquis)     Type of Anesthesia:  General    Additional requests/questions:    [name]   09/18/2021, 4:03 PM

## 2021-09-18 NOTE — Telephone Encounter (Signed)
Clinical pharmacist to review Eliquis 

## 2021-09-20 ENCOUNTER — Other Ambulatory Visit: Payer: Self-pay | Admitting: Neurological Surgery

## 2021-09-20 NOTE — Telephone Encounter (Signed)
Patient with diagnosis of afib on Eliquis for anticoagulation.    Procedure: L4-5 minimally invasive decompression Date of procedure: TBD  CHA2DS2-VASc Score = 6  This indicates a 9.7% annual risk of stroke. The patient's score is based upon: CHF History: 1 HTN History: 1 Diabetes History: 0 Stroke History: 2 Vascular Disease History: 1 Age Score: 1 Gender Score: 0   10/26/20 note: "He mentions a hx of TIA "some years ago" treated at [place], saw Dr. Mee Hives, neurologist.  Reports on his Eliquis at that time, no changes were made.  In review of notes, Dr. Rayann Heman noted: "I have spoken with the patient's neurologist. The patient has prior infarct on MRI however Dr Mee Hives feels that recent migraine in October could have been a microhemorrhage."  CrCl 68mL/min Platelet count 250K  Per office protocol, patient can hold Eliquis for 3 days prior to procedure.

## 2021-09-22 ENCOUNTER — Other Ambulatory Visit: Payer: Self-pay | Admitting: Internal Medicine

## 2021-09-22 NOTE — Progress Notes (Addendum)
Cardiology Office Note Date:  09/26/2021  Patient ID:  Peter, Mclean 30-Dec-1950, MRN [id number] PCP:  Christain Sacramento, MD  Electrophysiologist:  Dr. Rayann Heman   Chief Complaint: pre-op  History of Present Illness: Peter Mclean is a 71 y.o. male with history of HTN, NICM, chronic CHF (systolic) with recovered LVEF, bradycardia requiring down-titration of meds, and persistent Afib s/p PVI ablation in 2105, smoker, CAD (NSTEMI March 2022, LAD stent.  Remote visit with Dr. Rayann Heman  He mentions a hx of TIA "some years ago" treated at [place], saw Dr. Mee Hives, neurologist.  Reports on his Eliquis at that time, no changes were made.  In review of notes, Dr. Rayann Heman noted: "I have spoken with the patient's neurologist.  The patient has prior infarct on MRI however Dr Mee Hives (neurology) feels that recent migraine in October could have been a microhemorrhage.  Dr Mee Hives was not certain whether risks/ benefits favored stopping eliquis or not.  Chads2vasc score is at least 4.  I had a long discussion with the patient about options.  I did offer ILR with monitoring for further afib off of anticoagulation as an option.  The patient states that he is currently doing well and favors continuing anticoagulation with eliquis though he does understand risks of bleeding."  PMSxHx reports loop implant though the patient states he never decided to pursue the implant.  Unfortunately I am unable to remove it, (our staff is working on getting this corrected)  He saw Dr. Rayann Heman in Jan 2019.  At that time doing well, maintaining SR, off AAD.  The patient was confused on his meds, this was addressed, his amlodipine stopped and losartan resumed at 100mg  daily.  Planned for APP visit q 54mo.  Marland KitchenHe saw Dr. Rayann Heman Aug 2021, doing well,, no symptoms of AF.  Noted some edema, venous insufficiency, recommended support stockings, low NA diet and planned to update his  Recommended 31mo f/u.  TTE noted LVEF 55-50%, no WMA, RV  ok, he was in AF during the study, 115bpm  10/18/20 saw PMD, vitals reported HR 80 Telephone note reports he called and was recommended by his PMD to follow up with his cardiologist, ? Perhaps reports of being fatigued?  I saw him 10/26/20 The patient says he thought today's visit was a routine check in. He did see his PMD last week and mentioned to him his HR had a skip, but did not get the impression he was concerned about it. He feels quite well. He has gone back to work for the last 3 years since his wife passed, works at a [employer] and is on his feet always on the move and feels like he has very good exertional capacity, only getting winded when heavy lifting or carrying heavy things. No CP. He denies any cardiac awareness, no palpitations of any kind. No near syncope or syncope. Rarely with banding to standing he will get fleetingly lightheaded. No bleeding or signs of bleeding. His really only physical complaint is intermittently his L knee swells and is painful, nows he has arthritis in both his knees Planned for monitor to establish Afib burden, he was unaware that he was I AFib at the time of the visit  Hospitalized 11/27/20 - 11/30/20 w/NSTEMI, cath w/PCI to LAD DCCV 12/13/20 not successful Hospital visit 12/21/20 with CP, associated with time of an argument, HS Trop 27, 20, low suspicion for instent issues, and suspectedhis AFib and stress, discharged with lasix and increased amio  dose noting he was in/out of AFib  Saw Dr. Rayann Heman April 2022, not an ablatio candidate, had rash, Plavix stopped > Brilinta (resolved), he was in AFib, amio continued.  AFib clinic 03/14/21 in [place], discussed to repeat echo in a few months in [place] to re-evaluate his EF  TTE June 2022 recovered LVEF 60-65%   He comes today for pre-op evaluation, pending L4-5 decompression, general anesthesia RPH has addressed Eliquis, to hold 3 days prior to surgery RCRI score is 3, 11% DUKE: unable to  assess  TODAY He is having terrible back pain. Says the day after he got ome from his hospital stay in March  while having a BM fekt  a strong POP and pain in his low back. Since then he has had escalating back pain, was able to much/most of his usual activities and uties at work until about 3 months ago.  His back pain has steadily worsened with times that he can not getout of bed, or even roll over in bed without pain. He has some days that are better, was at work yesterday and able to walk around the shop, but more oftne then not very limited in what he is able to do because of the pain.  He has not had any CP, palpitations or cardiac awareness when he was up and around or now  He denies bleeding or signs of bleeding For the most part compliant with the Eliquis, though when he went to the ER a couple weeks ago for his pain he held it a week because he thought they may do his surgery sooner. He is instructed to hold 3 days prior to his surgery   HE HAS NOT BEEN TAKING BRILINTA for 4-5 months, 2/2 cost, says he/his daughter called about it but never heard back   AF history: 06/13/14, EPS, ablation (PVI, RA/SVC junction, and CTI)  AAD Hx Amiodarone stopped Oct 2015, notes indicate abn LFTs, jaundice, liver failure, ? Gallstone Amiodarone restarted march 2022 during hospitalization w/NSTEMI and RVR   Past Medical History:  Diagnosis Date   Anxiety    Arthritis    Chronic systolic CHF (congestive heart failure) ([place])    a. Dx 09/2013 in setting of AF-RVR; EF 10-15%, possibly tachy-mediated. Cath 09/2013: normal cors. b. TEE: EF 20-25% by echo 10/2013.   Dyslipidemia (high LDL; low HDL)    Elevated liver enzymes    H/O hematuria    History of inguinal hernia    Hypertension    NICM (nonischemic cardiomyopathy) (HCC)    Non-compliance    a. Adm 09/2013 after having stopped his meds.   Paroxysmal atrial fibrillation (HCC)    Paroxysmal atrial flutter (HCC)    typical appearing    Pseudoaneurysm following procedure ([place])    a. RLE pseudoaneurysm after cath, spontaneously decreased in size, did not require compression.   Sinus bradycardia    a. HR 40's in ER 11/2013 - digoxin stopped, Coreg decreased.   Tobacco abuse     Past Surgical History:  Procedure Laterality Date   ABLATION  06-13-14   PVI and CTI by Dr Rayann Heman   ATRIAL FIBRILLATION ABLATION N/A 06/13/2014   Procedure: ATRIAL FIBRILLATION ABLATION;  Surgeon: Coralyn Mark, MD;  Location: [facility] CATH LAB;  Service: Cardiovascular;  Laterality: N/A;   CARDIOVERSION N/A 10/17/2013   Procedure: CARDIOVERSION;  Surgeon: Sanda Klein, MD;  Location: [place] ENDOSCOPY;  Service: Cardiovascular;  Laterality: N/A;   CARDIOVERSION N/A 12/13/2020   Procedure: CARDIOVERSION;  Surgeon:  Fay Records, MD;  Location: [facility] ENDOSCOPY;  Service: Cardiovascular;  Laterality: N/A;   CHOLECYSTECTOMY     CORONARY STENT INTERVENTION N/A 11/29/2020   Procedure: CORONARY STENT INTERVENTION;  Surgeon: Lorretta Harp, MD;  Location: [place] [facility] CV LAB;  Service: Cardiovascular;  Laterality: N/A;   HERNIA REPAIR  1992 & 2011   INTRAVASCULAR IMAGING/OCT N/A 11/29/2020   Procedure: INTRAVASCULAR IMAGING/OCT;  Surgeon: Lorretta Harp, MD;  Location: [place] CV LAB;  Service: Cardiovascular;  Laterality: N/A;   LEFT AND RIGHT HEART CATHETERIZATION WITH CORONARY ANGIOGRAM N/A 10/13/2013   Procedure: LEFT AND RIGHT HEART CATHETERIZATION WITH CORONARY ANGIOGRAM;  Surgeon: Leonie Man, MD;  Location: [facility] CATH LAB;  Service: Cardiovascular;  Laterality: N/A;   LEFT HEART CATH AND CORONARY ANGIOGRAPHY N/A 11/29/2020   Procedure: LEFT HEART CATH AND CORONARY ANGIOGRAPHY;  Surgeon: Lorretta Harp, MD;  Location: [place] CV LAB;  Service: Cardiovascular;  Laterality: N/A;   PSEUDOANERYSM COMPRESSION  11/11/2013   Procedure: PSEUDOANERYSM COMPRESSION;  Surgeon: Fay Records, MD;  Location: [facility] CATH LAB;  Service: Cardiovascular;;   TEE WITHOUT  CARDIOVERSION N/A 10/17/2013   Procedure: TRANSESOPHAGEAL ECHOCARDIOGRAM (TEE);  Surgeon: Sanda Klein, MD;  Location: [facility] ENDOSCOPY;  Service: Cardiovascular;  Laterality: N/A;   TEE WITHOUT CARDIOVERSION N/A 06/12/2014   Procedure: TRANSESOPHAGEAL ECHOCARDIOGRAM (TEE);  Surgeon: Fay Records, MD;  Location: [place];  Service: Cardiovascular;  Laterality: N/A;    Current Outpatient Medications  Medication Sig Dispense Refill   albuterol (VENTOLIN HFA) 108 (90 Base) MCG/ACT inhaler 1 or 2 puffs up to 4 times a day as needed for wheezing     ALPRAZolam (XANAX) 1 MG tablet Take 0.5-1 mg by mouth 4 (four) times daily as needed for anxiety or sleep.     amiodarone (PACERONE) 200 MG tablet Take 1 tablet (200 mg total) by mouth daily. 90 tablet 1   atorvastatin (LIPITOR) 80 MG tablet Take 1 tablet (80 mg total) by mouth daily. 90 tablet 3   diclofenac Sodium (VOLTAREN) 1 % GEL Apply 2 g topically 4 (four) times daily. 50 g 0   ELIQUIS 5 MG TABS tablet TAKE 1 TABLET BY MOUTH TWICE A DAY 60 tablet 5   gabapentin (NEURONTIN) 300 MG capsule Take 300 mg by mouth 3 (three) times daily.     HYDROcodone-acetaminophen (NORCO/VICODIN) 5-325 MG tablet Take 1 tablet by mouth as needed for pain.     lidocaine (LIDODERM) 5 % Place 1 patch onto the skin daily. Remove & Discard patch within 12 hours or as directed by MD 30 patch 0   metoprolol succinate (TOPROL-XL) 100 MG 24 hr tablet TAKE 1 TABLET (100 MG TOTAL) BY MOUTH IN THE MORNING AND AT BEDTIME. 180 tablet 3   nitroGLYCERIN (NITROSTAT) 0.4 MG SL tablet Place 1 tablet (0.4 mg total) under the tongue every 5 (five) minutes as needed for chest pain. 25 tablet 3   spironolactone (ALDACTONE) 25 MG tablet Take 0.5 tablets (12.5 mg total) by mouth daily. 30 tablet 11   tamsulosin (FLOMAX) 0.4 MG CAPS capsule Take 1 capsule daily to improve bladder function     No current facility-administered medications for this visit.    Allergies:   Tramadol and Clopidogrel    Social History:  The patient  reports that he has been smoking. He has a 20.00 pack-year smoking history. He has never used smokeless tobacco. He reports that he does not currently use alcohol. He reports that he does  not use drugs.   Family History:  The patient's family history includes Heart attack in his father; Hyperlipidemia in his mother; Hypertension in his mother.  ROS:  Please see the history of present illness.  All other systems are reviewed and otherwise negative.   PHYSICAL EXAM: VS:  BP (!) 142/80    Pulse 60    Ht 5\' 7"  (1.702 m)    Wt 135 lb 9.6 oz (61.5 kg)    SpO2 97%    BMI 21.24 kg/m  BMI: Body mass index is 21.24 kg/m. Well nourished, well developed, in no acute distress  HEENT: normocephalic, atraumatic  Neck: no JVD, carotid bruits or masses Cardiac: RRR; no significant murmurs, no rubs, or gallops Lungs: CTA b/l, no wheezing, rhonchi or rales  Abd: soft, nontender MS: no deformity or atrophy, though has thin body habitus Ext: no edema  Skin: warm and dry, no rash Neuro:  No gross deficits appreciated Psych: euthymic mood, full affect   EKG:  Done today and reviewed by myself: SR 60bpm, , icRBBB, no changes  02/15/2021: TTE IMPRESSIONS   1. Left ventricular ejection fraction, by estimation, is 60 to 65%. The  left ventricle has normal function. The left ventricle has no regional  wall motion abnormalities. Left ventricular diastolic function could not  be evaluated.   2. Right ventricular systolic function is mildly reduced. The right  ventricular size is normal. Tricuspid regurgitation signal is inadequate  for assessing PA pressure.   3. The mitral valve is normal in structure. Trivial mitral valve  regurgitation. No evidence of mitral stenosis.   4. The aortic valve is normal in structure. Aortic valve regurgitation is  not visualized. No aortic stenosis is present.    Left heart cath 11/29/20: Mid LAD lesion is 99% stenosed. Prox LAD to Mid LAD  lesion is 50% stenosed. Post intervention, there is a 0% residual stenosis. Post intervention, there is a 0% residual stenosis. A drug-eluting stent was successfully placed using a STENT RESOLUTE ONYX 3.0X30. There is mild to moderate left ventricular systolic dysfunction. The left ventricular ejection fraction is 45-50% by visual estimate. LV end diastolic pressure is normal.   IMPRESSION: Mr. Twing had successful orbital atherectomy followed by OCT guided PCI and drug-eluting stenting.  This revealed a ruptured plaque in the proximal LAD most likely responsible for his non-STEMI with a 99% calcified plaque just distal to this.  The final angiographic result was reduction of a 99% calcified lesion to 0% residual TIMI-3 flow.  The patient tolerated the procedure well.  The guidewire and catheter removed.  The sheath was removed and a TR band was placed on the right wrist to achieve patent hemostasis.  The patient did receive a bolus of Aggrastat and he will have a 6-hour infusion given the thrombus demonstrated on OCT.  He will need dual antiplatelet therapy uninterrupted for 12 months.  Echo 11/29/20:  1. Left ventricular ejection fraction, by estimation, is 30 to 35%. The  left ventricle has moderately decreased function. The left ventricle  demonstrates global hypokinesis. Left ventricular diastolic function could  not be evaluated. There is of the  left ventricular,.   2. Right ventricular systolic function is normal. The right ventricular  size is normal.   3. The mitral valve is normal in structure. Mild mitral valve  regurgitation.   4. The aortic valve is normal in structure. Aortic valve regurgitation is  not visualized. No aortic stenosis is present.   05/25/20: TTE IMPRESSIONS  1. Left ventricular ejection fraction, by estimation, is 55 to 60%. The  left ventricle has normal function. The left ventricle has no regional  wall motion abnormalities. Left ventricular diastolic  function could not  be evaluated.   2. Right ventricular systolic function is normal. The right ventricular  size is normal.   3. The mitral valve is normal in structure. Trivial mitral valve  regurgitation. No evidence of mitral stenosis.   4. The aortic valve is normal in structure. Aortic valve regurgitation is  not visualized. No aortic stenosis is present.   5. The inferior vena cava is normal in size with greater than 50%  respiratory variability, suggesting right atrial pressure of 3 mmHg.   Comparison(s): No prior Echocardiogram. Atrial fibrillation with average  ventricular rate 115 bpm at rest during the study.    05/01/15: TTE Study Conclusions - Left ventricle: The cavity size was normal. Wall thickness was   normal. Systolic function was normal. Wall motion was normal;   there were no regional wall motion abnormalities. - Left atrium: The atrium was mildly dilated. - Right ventricle: The cavity size was normal. Wall thickness was   normal. Systolic function was normal. - Tricuspid valve: There was trivial regurgitation. - Pulmonary arteries: PA peak pressure: 21 mm Hg (S).   06/13/14:EPS/ablation CONCLUSIONS: 1. Sinus rhythm upon presentation.   2. 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  There was a right middle pulmonary vein which was isolated with the right inferior pulmonary vein.  The right superior pulmonary vein appeared to be the culprit vein and afib was terminated with isolation of this vein.  Additional right atrial ablation was performed at the junction of the SVC and RA. 3. Successful electrical isolation and anatomical encircling of all four pulmonary veins with radiofrequency current.    4. Cavo-tricuspid isthmus ablation was performed with complete bidirectional isthmus block achieved.  5. No inducible arrhythmias following ablation both on and off of  dobutamine 6. No early apparent complications.    Recent Labs: 11/27/2020: Magnesium 1.7 11/28/2020: TSH 2.451 12/21/2020: ALT 35; B Natriuretic Peptide 336.3; BUN 13; Creatinine, Ser 1.00; Hemoglobin 11.3; Platelets 203; Potassium 3.9; Sodium 138  11/28/2020: Cholesterol 152; HDL 36; LDL Cholesterol 103; Total CHOL/HDL Ratio 4.2; Triglycerides 64; VLDL 13   CrCl cannot be calculated (Patient's most recent lab result is older than the maximum 21 days allowed.).   Wt Readings from Last 3 Encounters:  09/26/21 135 lb 9.6 oz (61.5 kg)  03/14/21 137 lb 6.4 oz (62.3 kg)  01/22/21 133 lb 11.2 oz (60.6 kg)     Other studies reviewed: Additional studies/records reviewed today include: summarized above  ASSESSMENT AND PLAN:  1. Persistant AFib     CHA2DS2Vasc is 5, on Eliquis, appropriately dosed      on amiodarone, maintaining SR      2. NICM     June 2022, normalized LVEF      On BB/aldactone, no exam findings or symptoms to suggest fluid OL           3. HTN     No changes  4. CAD S/p NSTEMI/PCI march 2022 He has not been taking Brilinta for several months No CP Needs general cardiologist  5. HLD Not addressed today  6. Pre-op Low cardiac risk procedure Discussed with DOD, concerns given he is off his Brilinta and unable to assess his current METS/DUKE score with his terrible  back pain Recommends getting stat stress myoview pre-op to better assess his surgical cardiac risk, he is agreeable, hops it will not interfere with his surgery Hold Eliquis 3 days pre-op FINAL pre-op cardiac risk assessment is PENDING his stress test   ADDEND: 09/30/21 Stress myoview is low risk,  Reduced apical counts with normal wall motion consistent with apical thinning artifact.  Normal study without ischemia or infarction.  Normal LVEF, 64%.  This is a low-risk study. OK proceed with back surgery, pt RCRI (risk) score is 11% I will ask my MA to make the patient aware and send his  clearance. Resume Eliquis post-op as instructed by his surgeon Plan to start ASA or Brilinta afterwards, will arrange follow up in clinic, nearly to his year mark post PCI Peter Standard, PA-C 09/30/21, 07:25  Disposition: Will plan early follow up to likely get him on ASA, post op,  I did not order amio labs will plan for when he comes for his stress test.  Will establish him with Dr. Gasper Sells     Current medicines are reviewed at length with the patient today.  The patient did not have any concerns regarding medicines.  Peter Night, PA-C 09/26/2021 10:10 AM     [facility] [address] [place] [address] [phone] (office)  [phone] (fax)

## 2021-09-23 ENCOUNTER — Other Ambulatory Visit: Payer: Self-pay | Admitting: Neurological Surgery

## 2021-09-23 NOTE — Telephone Encounter (Signed)
Pt has appt with Francis Dowse, [facility] 09/26/21. Will forward notes to [facility] for upcoming appt. Will send FYI to requesting office pt ha appt 09/26/21. Once the pt has been cleared, provider will fax their notes and recommendations to requesting office.

## 2021-09-26 ENCOUNTER — Other Ambulatory Visit: Payer: Self-pay

## 2021-09-26 ENCOUNTER — Encounter: Payer: Self-pay | Admitting: Physician Assistant

## 2021-09-26 ENCOUNTER — Encounter: Payer: Self-pay | Admitting: *Deleted

## 2021-09-26 ENCOUNTER — Telehealth (HOSPITAL_COMMUNITY): Payer: Self-pay | Admitting: *Deleted

## 2021-09-26 ENCOUNTER — Ambulatory Visit: Payer: Medicare HMO | Admitting: Physician Assistant

## 2021-09-26 VITALS — BP 142/80 | HR 60 | Ht 67.0 in | Wt 135.6 lb

## 2021-09-26 DIAGNOSIS — I428 Other cardiomyopathies: Secondary | ICD-10-CM | POA: Diagnosis not present

## 2021-09-26 DIAGNOSIS — I2510 Atherosclerotic heart disease of native coronary artery without angina pectoris: Secondary | ICD-10-CM

## 2021-09-26 DIAGNOSIS — Z01818 Encounter for other preprocedural examination: Secondary | ICD-10-CM

## 2021-09-26 DIAGNOSIS — I10 Essential (primary) hypertension: Secondary | ICD-10-CM

## 2021-09-26 DIAGNOSIS — Z01810 Encounter for preprocedural cardiovascular examination: Secondary | ICD-10-CM

## 2021-09-26 DIAGNOSIS — I4819 Other persistent atrial fibrillation: Secondary | ICD-10-CM

## 2021-09-26 NOTE — Telephone Encounter (Signed)
Patient given detailed instructions per Myocardial Perfusion Study Information Sheet for the test on 09/27/21 at 7:30. Patient notified to arrive 15 minutes early and that it is imperative to arrive on time for appointment to keep from having the test rescheduled.  If you need to cancel or reschedule your appointment, please call the office within 24 hours of your appointment. . Patient verbalized understanding.Peter Mclean

## 2021-09-26 NOTE — Patient Instructions (Addendum)
Medication Instructions:   Your physician recommends that you continue on your current medications as directed. Please refer to the Current Medication list given to you today.  *If you need a refill on your cardiac medications before your next appointment, please call your pharmacy*   Lab Work:  NONE ORDERED  TODAY   If you have labs (blood work) drawn today and your tests are completely normal, you will receive your results only by: MyChart Message (if you have MyChart) OR A paper copy in the mail If you have any lab test that is abnormal or we need to change your treatment, we will call you to review the results.   Testing/Procedures: Your physician has requested that you have a lexiscan myoview. For further information please visit [email or web address] Please follow instruction sheet, as given.     Follow-Up: At [facility], you and your health needs are our priority.  As part of our continuing mission to provide you with exceptional heart care, we have created designated Provider Care Teams.  These Care Teams include your primary Cardiologist (physician) and Advanced Practice Providers (APPs -  Physician Assistants and Nurse Practitioners) who all work together to provide you with the care you need, when you need it.  We recommend signing up for the patient portal called "MyChart".  Sign up information is provided on this After Visit Summary.  MyChart is used to connect with patients for Virtual Visits (Telemedicine).  Patients are able to view lab/test results, encounter notes, upcoming appointments, etc.  Non-urgent messages can be sent to your provider as well.   To learn more about what you can do with MyChart, go to [email or web address].    Your next appointment:  [place] in 3 months   1 month(s)  The format for your next appointment:   In Person  Provider:   Francis Dowse, PA-C    Other Instructions

## 2021-09-27 ENCOUNTER — Other Ambulatory Visit: Payer: Self-pay | Admitting: *Deleted

## 2021-09-27 ENCOUNTER — Other Ambulatory Visit: Payer: Medicare HMO | Admitting: *Deleted

## 2021-09-27 ENCOUNTER — Ambulatory Visit (HOSPITAL_COMMUNITY): Payer: Medicare HMO | Attending: Cardiovascular Disease

## 2021-09-27 DIAGNOSIS — Z79899 Other long term (current) drug therapy: Secondary | ICD-10-CM

## 2021-09-27 DIAGNOSIS — I2510 Atherosclerotic heart disease of native coronary artery without angina pectoris: Secondary | ICD-10-CM | POA: Diagnosis present

## 2021-09-27 LAB — MYOCARDIAL PERFUSION IMAGING
LV dias vol: 71 mL (ref 62–150)
LV sys vol: 25 mL
Nuc Stress EF: 64 %
Peak HR: 77 {beats}/min
Rest HR: 51 {beats}/min
Rest Nuclear Isotope Dose: 10.2 mCi
SDS: 1
SRS: 0
SSS: 1
ST Depression (mm): 0 mm
Stress Nuclear Isotope Dose: 31.5 mCi
TID: 0.85

## 2021-09-27 LAB — COMPREHENSIVE METABOLIC PANEL
ALT: 32 IU/L (ref 0–44)
AST: 26 IU/L (ref 0–40)
Albumin/Globulin Ratio: 1.5 (ref 1.2–2.2)
Albumin: 4 g/dL (ref 3.8–4.8)
Alkaline Phosphatase: 115 IU/L (ref 44–121)
BUN/Creatinine Ratio: 18 (ref 10–24)
BUN: 21 mg/dL (ref 8–27)
Bilirubin Total: 1.1 mg/dL (ref 0.0–1.2)
CO2: 24 mmol/L (ref 20–29)
Calcium: 9.3 mg/dL (ref 8.6–10.2)
Chloride: 101 mmol/L (ref 96–106)
Creatinine, Ser: 1.19 mg/dL (ref 0.76–1.27)
Globulin, Total: 2.6 g/dL (ref 1.5–4.5)
Glucose: 82 mg/dL (ref 70–99)
Potassium: 4.9 mmol/L (ref 3.5–5.2)
Sodium: 139 mmol/L (ref 134–144)
Total Protein: 6.6 g/dL (ref 6.0–8.5)
eGFR: 66 mL/min/{1.73_m2} (ref >59)

## 2021-09-27 LAB — TSH: TSH: 2.5 u[IU]/mL (ref 0.450–4.500)

## 2021-09-27 MED ORDER — TECHNETIUM TC 99M TETROFOSMIN IV KIT
10.3000 | PACK | Freq: Once | INTRAVENOUS | Status: AC | PRN
  Administered 2021-09-27: 10.3 via INTRAVENOUS
  Filled 2021-09-27: qty 11

## 2021-09-27 MED ORDER — REGADENOSON 0.4 MG/5ML IV SOLN
0.4000 mg | Freq: Once | INTRAVENOUS | Status: AC
  Administered 2021-09-27: 0.4 mg via INTRAVENOUS

## 2021-09-27 MED ORDER — TECHNETIUM TC 99M TETROFOSMIN IV KIT
31.5000 | PACK | Freq: Once | INTRAVENOUS | Status: AC | PRN
  Administered 2021-09-27: 31.5 via INTRAVENOUS
  Filled 2021-09-27: qty 32

## 2021-09-30 ENCOUNTER — Encounter: Payer: Self-pay | Admitting: *Deleted

## 2021-09-30 ENCOUNTER — Telehealth: Payer: Self-pay | Admitting: *Deleted

## 2021-09-30 NOTE — Telephone Encounter (Signed)
-----   Message from [facility] sent at 09/30/2021  7:30 AM EST ----- Stress test looks good.  I made an addendum to my office note to include his stress test.  He has heart (CAD) disease so he has some cardiac risk for surgery, but his surgery is considered low cardiac risk and feel he is an acceptable risk for his back surgery. Please let him know and send the note/clearance to the surgeon's office  Hold Eliquis the 3 days as recommended pre-op He has not been taking the Brilita anyway so NO brilinta either  Please have him see Mardelle Matte or I in  a week or 2 to discussed when/what to start ASA (probably) but will d/w him at his visit.  THANKS

## 2021-09-30 NOTE — Telephone Encounter (Signed)
Lvm on patient vm of results and heads up that the Scheduler to call  to set up follow up in couple of weeks [place] PA-C. Note Faxed to Psychologist, clinical.

## 2021-09-30 NOTE — Pre-Procedure Instructions (Signed)
Surgical Instructions    Your procedure is scheduled on Thursday, January 19th.  Report to [facility] Main Entrance "A" at 05:30 A.M., then check in with the Admitting office.  Call this number if you have problems the morning of surgery:  [phone]   If you have any questions prior to your surgery date call [phone]: Open Monday-Friday 8am-4pm    Remember:  Do not eat after midnight the night before your surgery  You may drink clear liquids until 04:30 AM the morning of your surgery.   Clear liquids allowed are: Water, Non-Citrus Juices (without pulp), Carbonated Beverages, Clear Tea, Black Coffee Only, and Gatorade    Take these medicines the morning of surgery with A SIP OF WATER  amiodarone (PACERONE)  atorvastatin (LIPITOR)  gabapentin (NEURONTIN)  metoprolol succinate (TOPROL-XL)  tamsulosin (FLOMAX)   If needed: albuterol (VENTOLIN HFA)- if you need this, bring it with you on the day of surgery ALPRAZolam Prudy Feeler)  HYDROcodone-acetaminophen (NORCO/VICODIN)   Hold Eliquis for 3 days prior to surgery, per instructions.    As of today, STOP taking any Aspirin (unless otherwise instructed by your surgeon) Aleve, Naproxen, Ibuprofen, Motrin, Advil, Goody's, BC's, all herbal medications, fish oil, and all vitamins. This includes your diclofenac Sodium (VOLTAREN) 1 % GEL.                     Do NOT Smoke (Tobacco/Vaping) or drink Alcohol 24 hours prior to your procedure.  If you use a CPAP at night, you may bring all equipment for your overnight stay.   Contacts, glasses, piercing's, hearing aid's, dentures or partials may not be worn into surgery, please bring cases for these belongings.    For patients admitted to the hospital, discharge time will be determined by your treatment team.   Patients discharged the day of surgery will not be allowed to drive home, and someone needs to stay with them for 24 hours.  NO VISITORS WILL BE ALLOWED IN PRE-OP WHERE PATIENTS  GET READY FOR SURGERY.  ONLY 1 SUPPORT PERSON MAY BE PRESENT IN THE WAITING ROOM WHILE YOU ARE IN SURGERY.  IF YOU ARE TO BE ADMITTED, ONCE YOU ARE IN YOUR ROOM YOU WILL BE ALLOWED TWO (2) VISITORS.  Minor children may have two parents present. Special consideration for safety and communication needs will be reviewed on a case by case basis.   Special instructions:   [place]- Preparing For Surgery  Before surgery, you can play an important role. Because skin is not sterile, your skin needs to be as free of germs as possible. You can reduce the number of germs on your skin by washing with CHG (chlorahexidine gluconate) Soap before surgery.  CHG is an antiseptic cleaner which kills germs and bonds with the skin to continue killing germs even after washing.    Oral Hygiene is also important to reduce your risk of infection.  Remember - BRUSH YOUR TEETH THE MORNING OF SURGERY WITH YOUR REGULAR TOOTHPASTE  Please do not use if you have an allergy to CHG or antibacterial soaps. If your skin becomes reddened/irritated stop using the CHG.  Do not shave (including legs and underarms) for at least 48 hours prior to first CHG shower. It is OK to shave your face.  Please follow these instructions carefully.   Shower the NIGHT BEFORE SURGERY and the MORNING OF SURGERY  If you chose to wash your hair, wash your hair first as usual with your normal shampoo.  After you shampoo, rinse your hair and body thoroughly to remove the shampoo.  Use CHG Soap as you would any other liquid soap. You can apply CHG directly to the skin and wash gently with a scrungie or a clean washcloth.   Apply the CHG Soap to your body ONLY FROM THE NECK DOWN.  Do not use on open wounds or open sores. Avoid contact with your eyes, ears, mouth and genitals (private parts). Wash Face and genitals (private parts)  with your normal soap.   Wash thoroughly, paying special attention to the area where your surgery will be  performed.  Thoroughly rinse your body with warm water from the neck down.  DO NOT shower/wash with your normal soap after using and rinsing off the CHG Soap.  Pat yourself dry with a CLEAN TOWEL.  Wear CLEAN PAJAMAS to bed the night before surgery  Place CLEAN SHEETS on your bed the night before your surgery  DO NOT SLEEP WITH PETS.   Day of Surgery: Shower with CHG soap. Do not wear jewelry Do not wear lotions, powders, colognes, or deodorant. Men may shave face and neck. Do not bring valuables to the hospital. [facility] is not responsible for any belongings or valuables. Wear Clean/Comfortable clothing the morning of surgery Remember to brush your teeth WITH YOUR REGULAR TOOTHPASTE.   Please read over the following fact sheets that you were given.   3 days prior to your procedure or After your COVID test   You are not required to quarantine however you are required to wear a well-fitting mask when you are out and around people not in your household. If your mask becomes wet or soiled, replace with a new one.   Wash your hands often with soap and water for 20 seconds or clean your hands with an alcohol-based hand sanitizer that contains at least 60% alcohol.   Do not share personal items.   Notify your provider:  o if you are in close contact with someone who has COVID  o or if you develop a fever of 100.4 or greater, sneezing, cough, sore throat, shortness of breath or body aches.

## 2021-10-01 ENCOUNTER — Other Ambulatory Visit: Payer: Self-pay

## 2021-10-01 ENCOUNTER — Encounter (HOSPITAL_COMMUNITY): Payer: Self-pay

## 2021-10-01 ENCOUNTER — Encounter (HOSPITAL_COMMUNITY)
Admission: RE | Admit: 2021-10-01 | Discharge: 2021-10-01 | Disposition: A | Discharge status: Home / Self Care | Payer: Medicare HMO | Source: Ambulatory Visit | Attending: Neurological Surgery | Admitting: Neurological Surgery

## 2021-10-01 VITALS — BP 170/89 | HR 52 | Temp 97.6°F | Resp 18 | Ht 67.0 in | Wt 135.2 lb

## 2021-10-01 DIAGNOSIS — I110 Hypertensive heart disease with heart failure: Secondary | ICD-10-CM | POA: Insufficient documentation

## 2021-10-01 DIAGNOSIS — R001 Bradycardia, unspecified: Secondary | ICD-10-CM | POA: Diagnosis not present

## 2021-10-01 DIAGNOSIS — Z20822 Contact with and (suspected) exposure to covid-19: Secondary | ICD-10-CM | POA: Diagnosis not present

## 2021-10-01 DIAGNOSIS — I2510 Atherosclerotic heart disease of native coronary artery without angina pectoris: Secondary | ICD-10-CM | POA: Diagnosis not present

## 2021-10-01 DIAGNOSIS — Z955 Presence of coronary angioplasty implant and graft: Secondary | ICD-10-CM | POA: Insufficient documentation

## 2021-10-01 DIAGNOSIS — I428 Other cardiomyopathies: Secondary | ICD-10-CM | POA: Diagnosis not present

## 2021-10-01 DIAGNOSIS — I4819 Other persistent atrial fibrillation: Secondary | ICD-10-CM | POA: Diagnosis not present

## 2021-10-01 DIAGNOSIS — I5032 Chronic diastolic (congestive) heart failure: Secondary | ICD-10-CM | POA: Insufficient documentation

## 2021-10-01 DIAGNOSIS — Z7901 Long term (current) use of anticoagulants: Secondary | ICD-10-CM | POA: Diagnosis not present

## 2021-10-01 DIAGNOSIS — Z01812 Encounter for preprocedural laboratory examination: Secondary | ICD-10-CM | POA: Insufficient documentation

## 2021-10-01 DIAGNOSIS — I252 Old myocardial infarction: Secondary | ICD-10-CM | POA: Insufficient documentation

## 2021-10-01 DIAGNOSIS — Z01818 Encounter for other preprocedural examination: Secondary | ICD-10-CM

## 2021-10-01 HISTORY — DX: Atherosclerotic heart disease of native coronary artery without angina pectoris: I25.10

## 2021-10-01 LAB — CBC
HCT: 42.7 % (ref 39.0–52.0)
Hemoglobin: 13.9 g/dL (ref 13.0–17.0)
MCH: 30.4 pg (ref 26.0–34.0)
MCHC: 32.6 g/dL (ref 30.0–36.0)
MCV: 93.4 fL (ref 80.0–100.0)
Platelets: 207 10*3/uL (ref 150–400)
RBC: 4.57 MIL/uL (ref 4.22–5.81)
RDW: 13.8 % (ref 11.5–15.5)
WBC: 9.6 10*3/uL (ref 4.0–10.5)
nRBC: 0 % (ref 0.0–0.2)

## 2021-10-01 LAB — SARS CORONAVIRUS 2 (TAT 6-24 HRS): SARS Coronavirus 2: NEGATIVE

## 2021-10-01 LAB — SURGICAL PCR SCREEN
MRSA, PCR: NEGATIVE
Staphylococcus aureus: NEGATIVE

## 2021-10-01 NOTE — Progress Notes (Signed)
PCP - Dr. Kathryne Eriksson Cardiologist - Pt last saw Tommye Standard, [place]. She gave pt cardiac clearance after stress test and said he will establish care with Dr. Gasper Sells  PPM/ICD - denies   Chest x-ray - 12/21/20 EKG - 09/26/21 Stress Test - 09/27/21 ECHO - 02/15/21 Cardiac Cath - 11/29/20 and 10/13/13  Sleep Study - denies   DM- denies  Blood Thinner Instructions: pt instructed to hold Eliquis for 3 days. Last dose was 1/16. Aspirin Instructions: n/a  ERAS Protcol - yes, no drink   COVID TEST- 10/01/21 at PAT   Anesthesia review: yes, cardiac hx  Patient denies shortness of breath, fever, cough and chest pain at PAT appointment   All instructions explained to the patient, with a verbal understanding of the material. Patient agrees to go over the instructions while at home for a better understanding. Patient also instructed to wear a mask in public after being tested for COVID-19. The opportunity to ask questions was provided.

## 2021-10-02 NOTE — Anesthesia Preprocedure Evaluation (Addendum)
Anesthesia Evaluation  Patient identified by MRN, date of birth, ID band Patient awake    Reviewed: Allergy & Precautions, NPO status , Patient's Chart, lab work & pertinent test results  History of Anesthesia Complications Negative for: history of anesthetic complications  Airway Mallampati: I  TM Distance: >3 FB Neck ROM: Full    Dental  (+) Dental Advisory Given, Edentulous Upper, Edentulous Lower   Pulmonary neg pulmonary ROS, Current Smoker and Patient abstained from smoking.,    Pulmonary exam normal        Cardiovascular hypertension, Pt. on home beta blockers and Pt. on medications + CAD, + Past MI (NSTEMI 11/2020), + Cardiac Stents (11/29/20 DES to LAD) and +CHF  Normal cardiovascular exam+ dysrhythmias (s/p ablation 2015) Atrial Fibrillation    Myoview 09/27/21: no ischemia or infarction, normal LVEF 64%, low risk study  TTE 02/15/21: EF 60-65%, no RWMA, mildly reduced RVSF, valves unremarkable   Neuro/Psych Anxiety negative neurological ROS     GI/Hepatic negative GI ROS, Neg liver ROS,   Endo/Other  negative endocrine ROS  Renal/GU negative Renal ROS  negative genitourinary   Musculoskeletal negative musculoskeletal ROS (+)   Abdominal   Peds  Hematology negative hematology ROS (+) Eliquis   Anesthesia Other Findings   Reproductive/Obstetrics                          Anesthesia Physical Anesthesia Plan  ASA: 3  Anesthesia Plan: General   Post-op Pain Management: Tylenol PO (pre-op) and Toradol IV (intra-op)   Induction: Intravenous  PONV Risk Score and Plan: 2 and Ondansetron, Dexamethasone, Treatment may vary due to age or medical condition and Midazolam  Airway Management Planned: Oral ETT  Additional Equipment: None  Intra-op Plan:   Post-operative Plan: Extubation in OR  Informed Consent: I have reviewed the patients History and Physical, chart, labs and discussed  the procedure including the risks, benefits and alternatives for the proposed anesthesia with the patient or authorized representative who has indicated his/her understanding and acceptance.     Dental advisory given  Plan Discussed with:   Anesthesia Plan Comments: (PAT note by Antionette Poles, PA-C:  Follows with cardiology for history of HTN, NICM, chronic CHF (systolic) with recovered LVEF, bradycardia requiring down-titration of meds, and persistent Afib s/p PVI ablation in 2105, CAD (NSTEMI March 2022), LAD stent.  Last seen 09/26/2021 for preop evaluation.  Stress test was ordered for risk stratification.  Note was addended 09/30/2021 to comment on results of stress test stating, "Stress myoview is low risk,  1.Reduced apical counts with normal wall motion consistent with apical thinning artifact. 2.Normal study without ischemia or infarction. 3.Normal LVEF, 64%. 4.This is a low-risk study. OK proceed with back surgery, pt RCRI (risk) score is 11%. I will ask my MA to make the patient aware and send his clearance. Resume Eliquis post-op as instructed by his surgeon. Plan to start ASA or Brilinta afterwards, will arrange follow up in clinic, nearly to his year mark post PCI."  Preop labs reviewed, WNL.  EKG 09/26/2021: NSR.  Rate 60.  Nuclear stress 09/27/2021: 1. Reduced apical counts with normal wall motion consistent with apical thinning artifact.  2. Normal study without ischemia or infarction.  3. Normal LVEF, 64%.  4. This is a low-risk study.   TTE 02/15/2021: 1. Left ventricular ejection fraction, by estimation, is 60 to 65%. The  left ventricle has normal function. The left ventricle has no regional  wall motion abnormalities. Left ventricular diastolic function could not  be evaluated.  2. Right ventricular systolic function is mildly reduced. The right  ventricular size is normal. Tricuspid regurgitation signal is inadequate  for assessing PA pressure.  3. The mitral valve is  normal in structure. Trivial mitral valve  regurgitation. No evidence of mitral stenosis.  4. The aortic valve is normal in structure. Aortic valve regurgitation is  not visualized. No aortic stenosis is present.   Cath and PCI 11/29/2020:  Mid LAD lesion is 99% stenosed.  Prox LAD to Mid LAD lesion is 50% stenosed.  Post intervention, there is a 0% residual stenosis.  Post intervention, there is a 0% residual stenosis.  A drug-eluting stent was successfully placed using a STENT RESOLUTE ONYX 3.0X30.  There is mild to moderate left ventricular systolic dysfunction.  The left ventricular ejection fraction is 45-50% by visual estimate.  LV end diastolic pressure is normal.  )      Anesthesia Quick Evaluation

## 2021-10-02 NOTE — Progress Notes (Signed)
Anesthesia Chart Review:  Follows with cardiology for history of HTN, NICM, chronic CHF (systolic) with recovered LVEF, bradycardia requiring down-titration of meds, and persistent Afib s/p PVI ablation in 2105, CAD (NSTEMI March 2022), LAD stent.  Last seen 09/26/2021 for preop evaluation.  Stress test was ordered for risk stratification.  Note was addended 09/30/2021 to comment on results of stress test stating, "Stress myoview is low risk,  1.Reduced apical counts with normal wall motion consistent with apical thinning artifact. 2.Normal study without ischemia or infarction. 3.Normal LVEF, 64%. 4.This is a low-risk study. OK proceed with back surgery, pt RCRI (risk) score is 11%. I will ask my MA to make the patient aware and send his clearance. Resume Eliquis post-op as instructed by his surgeon. Plan to start ASA or Brilinta afterwards, will arrange follow up in clinic, nearly to his year mark post PCI."  Preop labs reviewed, WNL.  EKG 09/26/2021: NSR.  Rate 60.  Nuclear stress 09/27/2021: Reduced apical counts with normal wall motion consistent with apical thinning artifact.  Normal study without ischemia or infarction.  Normal LVEF, 64%.  This is a low-risk study.   TTE 02/15/2021:  1. Left ventricular ejection fraction, by estimation, is 60 to 65%. The  left ventricle has normal function. The left ventricle has no regional  wall motion abnormalities. Left ventricular diastolic function could not  be evaluated.   2. Right ventricular systolic function is mildly reduced. The right  ventricular size is normal. Tricuspid regurgitation signal is inadequate  for assessing PA pressure.   3. The mitral valve is normal in structure. Trivial mitral valve  regurgitation. No evidence of mitral stenosis.   4. The aortic valve is normal in structure. Aortic valve regurgitation is  not visualized. No aortic stenosis is present.   Cath and PCI 11/29/2020: Mid LAD lesion is 99% stenosed. Prox LAD to Mid  LAD lesion is 50% stenosed. Post intervention, there is a 0% residual stenosis. Post intervention, there is a 0% residual stenosis. A drug-eluting stent was successfully placed using a STENT RESOLUTE ONYX 3.0X30. There is mild to moderate left ventricular systolic dysfunction. The left ventricular ejection fraction is 45-50% by visual estimate. LV end diastolic pressure is normal.  Peter Mclean [facility] Short Stay Center/Anesthesiology Phone [phone] 10/02/2021 9:47 AM

## 2021-10-03 ENCOUNTER — Ambulatory Visit (HOSPITAL_COMMUNITY): Payer: Medicare HMO | Admitting: Certified Registered Nurse Anesthetist

## 2021-10-03 ENCOUNTER — Observation Stay (HOSPITAL_COMMUNITY)
Admission: RE | Admit: 2021-10-03 | Discharge: 2021-10-04 | Disposition: A | Discharge status: Home / Self Care | Payer: Medicare HMO | Source: Ambulatory Visit | Attending: Neurological Surgery | Admitting: Neurological Surgery

## 2021-10-03 ENCOUNTER — Ambulatory Visit (HOSPITAL_COMMUNITY): Payer: Medicare HMO | Admitting: Physician Assistant

## 2021-10-03 ENCOUNTER — Encounter (HOSPITAL_COMMUNITY)
Admission: RE | Disposition: A | Discharge status: Home / Self Care | Payer: Self-pay | Source: Ambulatory Visit | Attending: Neurological Surgery

## 2021-10-03 ENCOUNTER — Ambulatory Visit (HOSPITAL_COMMUNITY): Payer: Medicare HMO

## 2021-10-03 ENCOUNTER — Encounter (HOSPITAL_COMMUNITY): Payer: Self-pay | Admitting: Neurological Surgery

## 2021-10-03 ENCOUNTER — Other Ambulatory Visit: Payer: Self-pay

## 2021-10-03 DIAGNOSIS — M5416 Radiculopathy, lumbar region: Secondary | ICD-10-CM | POA: Diagnosis not present

## 2021-10-03 DIAGNOSIS — Z419 Encounter for procedure for purposes other than remedying health state, unspecified: Secondary | ICD-10-CM

## 2021-10-03 DIAGNOSIS — I5022 Chronic systolic (congestive) heart failure: Secondary | ICD-10-CM | POA: Insufficient documentation

## 2021-10-03 DIAGNOSIS — M48061 Spinal stenosis, lumbar region without neurogenic claudication: Principal | ICD-10-CM | POA: Insufficient documentation

## 2021-10-03 DIAGNOSIS — I110 Hypertensive heart disease with heart failure: Secondary | ICD-10-CM | POA: Insufficient documentation

## 2021-10-03 DIAGNOSIS — I2510 Atherosclerotic heart disease of native coronary artery without angina pectoris: Secondary | ICD-10-CM | POA: Diagnosis not present

## 2021-10-03 DIAGNOSIS — F17210 Nicotine dependence, cigarettes, uncomplicated: Secondary | ICD-10-CM | POA: Insufficient documentation

## 2021-10-03 DIAGNOSIS — M48062 Spinal stenosis, lumbar region with neurogenic claudication: Secondary | ICD-10-CM | POA: Diagnosis present

## 2021-10-03 HISTORY — PX: LUMBAR LAMINECTOMY/ DECOMPRESSION WITH MET-RX: SHX5959

## 2021-10-03 SURGERY — LUMBAR LAMINECTOMY/ DECOMPRESSION WITH MET-RX
Anesthesia: General | Site: Spine Cervical

## 2021-10-03 MED ORDER — NITROGLYCERIN 0.4 MG SL SUBL: 0.4000 mg | SUBLINGUAL_TABLET | SUBLINGUAL | Status: DC | PRN

## 2021-10-03 MED ORDER — ROCURONIUM BROMIDE 10 MG/ML (PF) SYRINGE
PREFILLED_SYRINGE | INTRAVENOUS | Status: DC | PRN
Start: 2021-10-03 — End: 2021-10-03
  Administered 2021-10-03: 50 mg via INTRAVENOUS
  Administered 2021-10-03: 20 mg via INTRAVENOUS

## 2021-10-03 MED ORDER — THROMBIN 5000 UNITS EX SOLR
CUTANEOUS | Status: AC
  Filled 2021-10-03: qty 5000

## 2021-10-03 MED ORDER — DEXAMETHASONE SODIUM PHOSPHATE 10 MG/ML IJ SOLN
INTRAMUSCULAR | Status: DC | PRN
Start: 2021-10-03 — End: 2021-10-03
  Administered 2021-10-03: 10 mg via INTRAVENOUS

## 2021-10-03 MED ORDER — AMIODARONE HCL 200 MG PO TABS
200.0000 mg | ORAL_TABLET | Freq: Every morning | ORAL | Status: DC
  Administered 2021-10-04: 200 mg via ORAL
  Filled 2021-10-03 (×2): qty 1

## 2021-10-03 MED ORDER — ROCURONIUM BROMIDE 10 MG/ML (PF) SYRINGE
PREFILLED_SYRINGE | INTRAVENOUS | Status: AC
  Filled 2021-10-03: qty 10

## 2021-10-03 MED ORDER — ATORVASTATIN CALCIUM 80 MG PO TABS
80.0000 mg | ORAL_TABLET | Freq: Every morning | ORAL | Status: DC
Start: 2021-10-03 — End: 2021-10-04
  Administered 2021-10-03 – 2021-10-04 (×2): 80 mg via ORAL
  Filled 2021-10-03 (×2): qty 1

## 2021-10-03 MED ORDER — LACTATED RINGERS IV SOLN: INTRAVENOUS | Status: DC

## 2021-10-03 MED ORDER — THROMBIN 5000 UNITS EX SOLR: OROMUCOSAL | Status: DC | PRN

## 2021-10-03 MED ORDER — ACETAMINOPHEN 325 MG PO TABS
650.0000 mg | ORAL_TABLET | ORAL | Status: DC | PRN
  Administered 2021-10-03 – 2021-10-04 (×3): 650 mg via ORAL
  Filled 2021-10-03 (×3): qty 2

## 2021-10-03 MED ORDER — OXYCODONE HCL 5 MG PO TABS
10.0000 mg | ORAL_TABLET | ORAL | Status: DC | PRN
  Administered 2021-10-03 – 2021-10-04 (×3): 10 mg via ORAL
  Filled 2021-10-03 (×3): qty 2

## 2021-10-03 MED ORDER — CHLORHEXIDINE GLUCONATE 0.12 % MT SOLN
15.0000 mL | Freq: Once | OROMUCOSAL | Status: AC
  Administered 2021-10-03: 15 mL via OROMUCOSAL
  Filled 2021-10-03: qty 15

## 2021-10-03 MED ORDER — PHENYLEPHRINE HCL-NACL 20-0.9 MG/250ML-% IV SOLN
INTRAVENOUS | Status: DC | PRN
  Administered 2021-10-03: 25 ug/min via INTRAVENOUS

## 2021-10-03 MED ORDER — EPHEDRINE SULFATE-NACL 50-0.9 MG/10ML-% IV SOSY
PREFILLED_SYRINGE | INTRAVENOUS | Status: DC | PRN
  Administered 2021-10-03 (×3): 10 mg via INTRAVENOUS
  Administered 2021-10-03 (×2): 5 mg via INTRAVENOUS
  Administered 2021-10-03: 10 mg via INTRAVENOUS

## 2021-10-03 MED ORDER — ONDANSETRON HCL 4 MG PO TABS: 4.0000 mg | ORAL_TABLET | Freq: Four times a day (QID) | ORAL | Status: DC | PRN

## 2021-10-03 MED ORDER — ONDANSETRON HCL 4 MG/2ML IJ SOLN: 4.0000 mg | Freq: Four times a day (QID) | INTRAMUSCULAR | Status: DC | PRN

## 2021-10-03 MED ORDER — MIDAZOLAM HCL 2 MG/2ML IJ SOLN
INTRAMUSCULAR | Status: AC
  Filled 2021-10-03: qty 2

## 2021-10-03 MED ORDER — SODIUM CHLORIDE 0.9% FLUSH
3.0000 mL | Freq: Two times a day (BID) | INTRAVENOUS | Status: DC
  Administered 2021-10-03: 3 mL via INTRAVENOUS

## 2021-10-03 MED ORDER — OXYCODONE HCL 5 MG PO TABS
5.0000 mg | ORAL_TABLET | ORAL | Status: DC | PRN
  Administered 2021-10-04: 5 mg via ORAL
  Filled 2021-10-03: qty 1

## 2021-10-03 MED ORDER — HYDROMORPHONE HCL 1 MG/ML IJ SOLN: 1.0000 mg | INTRAMUSCULAR | Status: DC | PRN

## 2021-10-03 MED ORDER — OXYCODONE HCL 5 MG/5ML PO SOLN: 5.0000 mg | Freq: Once | ORAL | Status: DC | PRN

## 2021-10-03 MED ORDER — MIDAZOLAM HCL 2 MG/2ML IJ SOLN
INTRAMUSCULAR | Status: DC | PRN
  Administered 2021-10-03 (×2): 1 mg via INTRAVENOUS

## 2021-10-03 MED ORDER — 0.9 % SODIUM CHLORIDE (POUR BTL) OPTIME
TOPICAL | Status: DC | PRN
  Administered 2021-10-03: 1000 mL

## 2021-10-03 MED ORDER — LIDOCAINE-EPINEPHRINE 1 %-1:100000 IJ SOLN
INTRAMUSCULAR | Status: AC
  Filled 2021-10-03: qty 1

## 2021-10-03 MED ORDER — DICLOFENAC SODIUM 1 % EX GEL
1.0000 "application " | Freq: Two times a day (BID) | CUTANEOUS | Status: DC
  Filled 2021-10-03: qty 100

## 2021-10-03 MED ORDER — ALPRAZOLAM 0.5 MG PO TABS
1.0000 mg | ORAL_TABLET | Freq: Every day | ORAL | Status: DC
Start: 2021-10-03 — End: 2021-10-04
  Administered 2021-10-03: 1 mg via ORAL
  Filled 2021-10-03: qty 2

## 2021-10-03 MED ORDER — CHLORHEXIDINE GLUCONATE CLOTH 2 % EX PADS: 6.0000 | MEDICATED_PAD | Freq: Once | CUTANEOUS | Status: DC

## 2021-10-03 MED ORDER — LIDOCAINE 2% (20 MG/ML) 5 ML SYRINGE
INTRAMUSCULAR | Status: DC | PRN
  Administered 2021-10-03: 60 mg via INTRAVENOUS

## 2021-10-03 MED ORDER — FENTANYL CITRATE (PF) 100 MCG/2ML IJ SOLN
INTRAMUSCULAR | Status: DC | PRN
  Administered 2021-10-03 (×3): 50 ug via INTRAVENOUS

## 2021-10-03 MED ORDER — METOPROLOL SUCCINATE ER 100 MG PO TB24
100.0000 mg | ORAL_TABLET | Freq: Every morning | ORAL | Status: DC
  Administered 2021-10-03 – 2021-10-04 (×2): 100 mg via ORAL
  Filled 2021-10-03 (×2): qty 1

## 2021-10-03 MED ORDER — TAMSULOSIN HCL 0.4 MG PO CAPS
0.4000 mg | ORAL_CAPSULE | Freq: Every day | ORAL | Status: DC
  Administered 2021-10-03: 0.4 mg via ORAL
  Filled 2021-10-03: qty 1

## 2021-10-03 MED ORDER — GABAPENTIN 300 MG PO CAPS
300.0000 mg | ORAL_CAPSULE | Freq: Two times a day (BID) | ORAL | Status: DC
  Administered 2021-10-03 – 2021-10-04 (×2): 300 mg via ORAL
  Filled 2021-10-03 (×3): qty 1

## 2021-10-03 MED ORDER — ONDANSETRON HCL 4 MG/2ML IJ SOLN
INTRAMUSCULAR | Status: AC
  Filled 2021-10-03: qty 2

## 2021-10-03 MED ORDER — CEFAZOLIN SODIUM-DEXTROSE 2-4 GM/100ML-% IV SOLN
2.0000 g | Freq: Three times a day (TID) | INTRAVENOUS | Status: AC
  Administered 2021-10-03 (×2): 2 g via INTRAVENOUS
  Filled 2021-10-03 (×2): qty 100

## 2021-10-03 MED ORDER — FENTANYL CITRATE (PF) 100 MCG/2ML IJ SOLN: 25.0000 ug | INTRAMUSCULAR | Status: DC | PRN

## 2021-10-03 MED ORDER — LIDOCAINE 5 % EX PTCH
1.0000 | MEDICATED_PATCH | CUTANEOUS | Status: DC
  Filled 2021-10-03 (×2): qty 1

## 2021-10-03 MED ORDER — SODIUM CHLORIDE 0.9% FLUSH: 3.0000 mL | INTRAVENOUS | Status: DC | PRN

## 2021-10-03 MED ORDER — DOCUSATE SODIUM 100 MG PO CAPS
100.0000 mg | ORAL_CAPSULE | Freq: Two times a day (BID) | ORAL | Status: DC
  Administered 2021-10-03 – 2021-10-04 (×3): 100 mg via ORAL
  Filled 2021-10-03 (×3): qty 1

## 2021-10-03 MED ORDER — PROPOFOL 10 MG/ML IV BOLUS
INTRAVENOUS | Status: DC | PRN
  Administered 2021-10-03: 110 mg via INTRAVENOUS

## 2021-10-03 MED ORDER — ONDANSETRON HCL 4 MG/2ML IJ SOLN: 4.0000 mg | Freq: Once | INTRAMUSCULAR | Status: DC | PRN

## 2021-10-03 MED ORDER — ORAL CARE MOUTH RINSE: 15.0000 mL | Freq: Once | OROMUCOSAL | Status: AC

## 2021-10-03 MED ORDER — SUGAMMADEX SODIUM 200 MG/2ML IV SOLN
INTRAVENOUS | Status: DC | PRN
Start: 2021-10-03 — End: 2021-10-03
  Administered 2021-10-03: 200 mg via INTRAVENOUS

## 2021-10-03 MED ORDER — AMISULPRIDE (ANTIEMETIC) 5 MG/2ML IV SOLN: 10.0000 mg | Freq: Once | INTRAVENOUS | Status: DC | PRN

## 2021-10-03 MED ORDER — PHENOL 1.4 % MT LIQD: 1.0000 | OROMUCOSAL | Status: DC | PRN

## 2021-10-03 MED ORDER — DEXAMETHASONE SODIUM PHOSPHATE 10 MG/ML IJ SOLN
INTRAMUSCULAR | Status: AC
  Filled 2021-10-03: qty 1

## 2021-10-03 MED ORDER — OXYCODONE HCL 5 MG PO TABS: 5.0000 mg | ORAL_TABLET | Freq: Once | ORAL | Status: DC | PRN

## 2021-10-03 MED ORDER — ACETAMINOPHEN 650 MG RE SUPP: 650.0000 mg | RECTAL | Status: DC | PRN

## 2021-10-03 MED ORDER — ACETAMINOPHEN 500 MG PO TABS
1000.0000 mg | ORAL_TABLET | Freq: Once | ORAL | Status: AC
  Administered 2021-10-03: 1000 mg via ORAL
  Filled 2021-10-03: qty 2

## 2021-10-03 MED ORDER — SODIUM CHLORIDE 0.9 % IV SOLN: 250.0000 mL | INTRAVENOUS | Status: DC

## 2021-10-03 MED ORDER — EPHEDRINE 5 MG/ML INJ
INTRAVENOUS | Status: AC
  Filled 2021-10-03: qty 5

## 2021-10-03 MED ORDER — LIDOCAINE-EPINEPHRINE 1 %-1:100000 IJ SOLN
INTRAMUSCULAR | Status: DC | PRN
  Administered 2021-10-03: 10 mL

## 2021-10-03 MED ORDER — FENTANYL CITRATE (PF) 250 MCG/5ML IJ SOLN
INTRAMUSCULAR | Status: AC
  Filled 2021-10-03: qty 5

## 2021-10-03 MED ORDER — POLYETHYLENE GLYCOL 3350 17 G PO PACK: 17.0000 g | PACK | Freq: Every day | ORAL | Status: DC | PRN

## 2021-10-03 MED ORDER — ONDANSETRON HCL 4 MG/2ML IJ SOLN
INTRAMUSCULAR | Status: DC | PRN
Start: 2021-10-03 — End: 2021-10-03
  Administered 2021-10-03: 4 mg via INTRAVENOUS

## 2021-10-03 MED ORDER — MENTHOL 3 MG MT LOZG
1.0000 | LOZENGE | OROMUCOSAL | Status: DC | PRN
  Filled 2021-10-03: qty 9

## 2021-10-03 MED ORDER — ALBUTEROL SULFATE (2.5 MG/3ML) 0.083% IN NEBU: 3.0000 mL | INHALATION_SOLUTION | Freq: Four times a day (QID) | RESPIRATORY_TRACT | Status: DC | PRN

## 2021-10-03 MED ORDER — LIDOCAINE 2% (20 MG/ML) 5 ML SYRINGE
INTRAMUSCULAR | Status: AC
  Filled 2021-10-03: qty 5

## 2021-10-03 MED ORDER — CEFAZOLIN SODIUM-DEXTROSE 2-4 GM/100ML-% IV SOLN
2.0000 g | INTRAVENOUS | Status: AC
  Administered 2021-10-03: 2 g via INTRAVENOUS
  Filled 2021-10-03: qty 100

## 2021-10-03 MED ORDER — SPIRONOLACTONE 12.5 MG HALF TABLET
12.5000 mg | ORAL_TABLET | Freq: Every day | ORAL | Status: DC
  Administered 2021-10-03 – 2021-10-04 (×2): 12.5 mg via ORAL
  Filled 2021-10-03 (×2): qty 1

## 2021-10-03 SURGICAL SUPPLY — 50 items
BAG COUNTER SPONGE SURGICOUNT (BAG) ×2 IMPLANT
BAND RUBBER #18 3X1/16 STRL (MISCELLANEOUS) ×4 IMPLANT
BLADE CLIPPER SURG (BLADE) IMPLANT
BLADE SURG 11 STRL SS (BLADE) ×2 IMPLANT
BUR PRECISION FLUTE 5.0 (BURR) IMPLANT
BUR PRECISION MATCH 3.0 13 (BURR) ×1 IMPLANT
BUR ROUND PRECISION 4.0 (BURR) ×1 IMPLANT
CANISTER SUCT 3000ML PPV (MISCELLANEOUS) ×2 IMPLANT
DECANTER SPIKE VIAL GLASS SM (MISCELLANEOUS) ×1 IMPLANT
DERMABOND ADVANCED (GAUZE/BANDAGES/DRESSINGS) ×1
DERMABOND ADVANCED .7 DNX12 (GAUZE/BANDAGES/DRESSINGS) ×1 IMPLANT
DRAPE C-ARM 42X72 X-RAY (DRAPES) ×4 IMPLANT
DRAPE LAPAROTOMY 100X72X124 (DRAPES) ×2 IMPLANT
DRAPE MICROSCOPE LEICA (MISCELLANEOUS) ×2 IMPLANT
DRAPE SURG 17X23 STRL (DRAPES) ×2 IMPLANT
DURAPREP 26ML APPLICATOR (WOUND CARE) ×2 IMPLANT
ELECT BLADE 6.5 EXT (BLADE) ×2 IMPLANT
ELECT REM PT RETURN 9FT ADLT (ELECTROSURGICAL) ×2
ELECTRODE REM PT RTRN 9FT ADLT (ELECTROSURGICAL) ×1 IMPLANT
GAUZE 4X4 16PLY ~~LOC~~+RFID DBL (SPONGE) IMPLANT
GAUZE SPONGE 4X4 12PLY STRL (GAUZE/BANDAGES/DRESSINGS) IMPLANT
GLOVE EXAM NITRILE LRG STRL (GLOVE) IMPLANT
GLOVE EXAM NITRILE XL STR (GLOVE) IMPLANT
GLOVE EXAM NITRILE XS STR PU (GLOVE) IMPLANT
GLOVE SURG LTX SZ7.5 (GLOVE) ×2 IMPLANT
GLOVE SURG POLYISO LF SZ7.5 (GLOVE) ×4 IMPLANT
GLOVE SURG UNDER POLY LF SZ7.5 (GLOVE) ×2 IMPLANT
GOWN STRL REUS W/ TWL LRG LVL3 (GOWN DISPOSABLE) ×2 IMPLANT
GOWN STRL REUS W/ TWL XL LVL3 (GOWN DISPOSABLE) IMPLANT
GOWN STRL REUS W/TWL 2XL LVL3 (GOWN DISPOSABLE) IMPLANT
GOWN STRL REUS W/TWL LRG LVL3 (GOWN DISPOSABLE) ×2
GOWN STRL REUS W/TWL XL LVL3 (GOWN DISPOSABLE)
HEMOSTAT POWDER KIT SURGIFOAM (HEMOSTASIS) ×2 IMPLANT
KIT BASIN OR (CUSTOM PROCEDURE TRAY) ×2 IMPLANT
KIT TURNOVER KIT B (KITS) ×2 IMPLANT
NDL SPNL 18GX3.5 QUINCKE PK (NEEDLE) ×1 IMPLANT
NEEDLE HYPO 22GX1.5 SAFETY (NEEDLE) ×2 IMPLANT
NEEDLE SPNL 18GX3.5 QUINCKE PK (NEEDLE) ×2 IMPLANT
NS IRRIG 1000ML POUR BTL (IV SOLUTION) ×2 IMPLANT
PACK LAMINECTOMY NEURO (CUSTOM PROCEDURE TRAY) ×2 IMPLANT
PAD ARMBOARD 7.5X6 YLW CONV (MISCELLANEOUS) ×9 IMPLANT
SPONGE T-LAP 4X18 ~~LOC~~+RFID (SPONGE) IMPLANT
SUT MNCRL AB 3-0 PS2 18 (SUTURE) IMPLANT
SUT MNCRL AB 3-0 PS2 27 (SUTURE) ×1 IMPLANT
SUT VIC AB 0 CT1 18XCR BRD8 (SUTURE) IMPLANT
SUT VIC AB 0 CT1 8-18 (SUTURE)
SUT VIC AB 2-0 CP2 18 (SUTURE) ×2 IMPLANT
TOWEL GREEN STERILE (TOWEL DISPOSABLE) ×2 IMPLANT
TOWEL GREEN STERILE FF (TOWEL DISPOSABLE) ×2 IMPLANT
WATER STERILE IRR 1000ML POUR (IV SOLUTION) ×2 IMPLANT

## 2021-10-03 NOTE — Op Note (Signed)
PATIENT: Peter Mclean  DAY OF SURGERY: 10/03/21   PRE-OPERATIVE DIAGNOSIS:  Lumbar stenosis with lumbar radiculopathy   POST-OPERATIVE DIAGNOSIS:  Lumbar stenosis with lumbar radiculopathy   PROCEDURE:  Minimally invasive L4-5 laminectomy   SURGEON:  Surgeon(s) and Role:    Jadene Pierini, MD - Primary   ANESTHESIA: ETGA   BRIEF HISTORY: This is a 71 year old man who presented with lumbar radiculopathy, left greater than right, with a claudication component. MRI showed severe stenosis at L4-5 that matched his symptoms. His symptoms were not controlled by non-surgical treatment, I therefore recommended a minimally invasive L4-5 laminectomy. This was discussed with the patient as well as risks, benefits, and alternatives and the patient wished to proceed with surgical treatment.   OPERATIVE DETAIL: The patient was taken to the operating room and placed on the OR table in the prone position. A formal time out was performed with two patient identifiers and confirmed the operative site. Anesthesia was induced by the anesthesia team. The operative site was marked, hair was clipped with surgical clippers, the area was then prepped and draped in a sterile fashion. Fluoroscopy was used to identify the surgical level with a spinal needle. A 2cm incision was then marked 2cm off to the left of midline. The fascia was incised sharply and serial dilators were docked to the spinolaminar interface on L4. A final tubular distractor was placed and secured to the table and fluoro again confirmed the correct surgical level. The spinous process, lamina, and facet locations were confirmed for orientation. A high speed drill was then used to perform an L4 laminotomy until the insertion of the ligamentum flavum was identified superiorly. This was then extended to complete the L4 hemilaminotomy by extending medially to the midline and laterally to the lateral insertion of the ligamentum flavum. The table was tilted  and tubular dilator was adjusted to look across midline. An 'over the top' decompression was then performed by dissecting the ligamentum flavum off of the spinous process and lamina and identifying its insertion superiorly on the lamina and laterally at the facet. A #9 suction was then used to protect the thecal sac while the contralateral lamina was removed. With drilling complete, the ligamentum flavum was then completely resected from its insertion and dura was palpated in all directions to confirm good decompression. I had a good view centrally up to the prior insertion of ligamentum flavum, laterally to the bilateral lateral recesses and was able to follow the bilateral traversing roots caudally to their entrance at the foramina bilaterally delimited by the pedicle.   Hemostasis was obtained, the wound was copiously irrigated, and the tube was removed while using the microscope to confirm hemostasis of the muscle edges. All instrument and sponge counts were correct and the incision was then closed in layers. The patient was then returned to anesthesia for emergence. No apparent complications at the completion of the procedure.   EBL:  35mL   DRAINS: none   SPECIMENS: none   Jadene Pierini, MD 10/03/21 7:31 AM

## 2021-10-03 NOTE — Transfer of Care (Signed)
Immediate Anesthesia Transfer of Care Note  Patient: Peter Mclean  Procedure(s) Performed: Lumbar four-five Minimally Invasive  Decompression with Metrx (Spine Cervical)  Patient Location: PACU  Anesthesia Type:General  Level of Consciousness: awake, alert  and oriented  Airway & Oxygen Therapy: Patient Spontanous Breathing and Patient connected to face mask oxygen  Post-op Assessment: Report given to RN and Post -op Vital signs reviewed and stable  Post vital signs: Reviewed and stable  Last Vitals:  Vitals Value Taken Time  BP 152/80 10/03/21 0948  Temp    Pulse 57 10/03/21 0949  Resp 8 10/03/21 0949  SpO2 100 % 10/03/21 0949  Vitals shown include unvalidated device data.  Last Pain:  Vitals:   10/03/21 0555  TempSrc:   PainSc: 8       Patients Stated Pain Goal: 0 (10/03/21 0555)  Complications: No notable events documented.

## 2021-10-03 NOTE — Anesthesia Procedure Notes (Signed)
Procedure Name: Intubation Date/Time: 10/03/2021 7:45 AM Performed by: Genelle Bal, CRNA Pre-anesthesia Checklist: Patient identified, Emergency Drugs available, Suction available and Patient being monitored Patient Re-evaluated:Patient Re-evaluated prior to induction Oxygen Delivery Method: Circle system utilized Preoxygenation: Pre-oxygenation with 100% oxygen Induction Type: IV induction Ventilation: Mask ventilation without difficulty Laryngoscope Size: Miller and 2 Grade View: Grade I Tube type: Oral Tube size: 7.5 mm Number of attempts: 1 Airway Equipment and Method: Stylet and Oral airway Placement Confirmation: ETT inserted through vocal cords under direct vision, positive ETCO2 and breath sounds checked- equal and bilateral Secured at: 21 cm Tube secured with: Tape Dental Injury: Teeth and Oropharynx as per pre-operative assessment

## 2021-10-03 NOTE — H&P (Signed)
Surgical H&P Update  HPI: 71 y.o. man with left greater than right lumbar radiculopathy due to severe canal stenosis. No changes in health since he was last seen. Still having pain / numbness / weakness and wishes to proceed with surgery.  PMHx:  Past Medical History:  Diagnosis Date   Anxiety    Arthritis    Chronic systolic CHF (congestive heart failure) ([place])    a. Dx 09/2013 in setting of AF-RVR; EF 10-15%, possibly tachy-mediated. Cath 09/2013: normal cors. b. TEE: EF 20-25% by echo 10/2013.   Coronary artery disease    Dyslipidemia (high LDL; low HDL)    Elevated liver enzymes    H/O hematuria    History of inguinal hernia    Hypertension    NICM (nonischemic cardiomyopathy) ([facility])    Non-compliance    a. Adm 09/2013 after having stopped his meds.   NSTEMI (non-ST elevated myocardial infarction) ([place]) 11/27/2020   LAD stent   Paroxysmal atrial fibrillation ([facility])    Paroxysmal atrial flutter ([facility])    typical appearing   Pseudoaneurysm following procedure ([place])    a. RLE pseudoaneurysm after cath, spontaneously decreased in size, did not require compression.   Sinus bradycardia    a. HR 40's in ER 11/2013 - digoxin stopped, Coreg decreased.   Tobacco abuse    FamHx:  Family History  Problem Relation Age of Onset   Hyperlipidemia Mother    Hypertension Mother    Heart attack Father    SocHx:  reports that he has been smoking cigarettes. He has a 10.00 pack-year smoking history. He has never used smokeless tobacco. He reports that he does not currently use alcohol. He reports that he does not use drugs.  Physical Exam: Strength 5/5 x4 except left 4+/5 EHL and TA, SILTx4 except left L5 numbness  Assesment/Plan: 71 y.o. man with lumbar radiculopathy 2/2 lumbar stenosis, here for L4-5 MIS decompression. Risks, benefits, and alternatives discussed and the patient would like to continue with surgery.  -OR today -3C post-op  Judith Part, MD 10/03/21 7:27 AM

## 2021-10-03 NOTE — Anesthesia Postprocedure Evaluation (Signed)
Anesthesia Post Note  Patient: Peter Mclean  Procedure(s) Performed: Lumbar four-five Minimally Invasive  Decompression with Metrx (Spine Cervical)     Patient location during evaluation: PACU Anesthesia Type: General Level of consciousness: awake and alert Pain management: pain level controlled Vital Signs Assessment: post-procedure vital signs reviewed and stable Respiratory status: spontaneous breathing, nonlabored ventilation and respiratory function stable Cardiovascular status: blood pressure returned to baseline and stable Postop Assessment: no apparent nausea or vomiting Anesthetic complications: no   No notable events documented.  Last Vitals:  Vitals:   10/03/21 1039 10/03/21 1043  BP: 130/76   Pulse: (!) 53   Resp: 18 20  Temp: 36.6 C   SpO2: 93%     Last Pain:  Vitals:   10/03/21 1230  TempSrc:   PainSc: 3                  Lidia Collum

## 2021-10-03 NOTE — Discharge Summary (Addendum)
Discharge Summary  Date of Admission: 10/03/2021  Date of Discharge: 10/04/21  Attending Physician: Autumn Patty, MD  Hospital Course: Patient was admitted following an uncomplicated L4-5 MIS laminectomy. He was recovered in PACU and transferred to [facility]. His preop symptoms were completely resolved, his hospital course was uncomplicated and the patient was discharged home on 10/04/21. He will follow up in clinic with me in 2 weeks and restart his eliquis in 3 days.  Neurologic exam at discharge:  Strength 5/5 x4 & SILTx4 except preop left L5 numbness and TA/EHL 4+/5 weakness  Discharge diagnosis: Lumbar stenosis  Jadene Pierini, MD 10/03/21 9:44 AM

## 2021-10-04 ENCOUNTER — Encounter (HOSPITAL_COMMUNITY): Payer: Self-pay | Admitting: Neurological Surgery

## 2021-10-04 DIAGNOSIS — M48061 Spinal stenosis, lumbar region without neurogenic claudication: Secondary | ICD-10-CM | POA: Diagnosis not present

## 2021-10-04 MED ORDER — APIXABAN 5 MG PO TABS: 5.0000 mg | ORAL_TABLET | Freq: Two times a day (BID) | ORAL | 5 refills | Status: AC

## 2021-10-04 NOTE — Evaluation (Signed)
Physical Therapy Evaluation Patient Details Name: Peter Mclean MRN: [id number] DOB: Jun 10, 1951 Today's Date: 10/04/2021  History of Present Illness  71 y.o. 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 Pt underwent L4-5 laminectomy on 1/19. PMH includes anxiety, OA, CHF, CAD, HTN, NSTEMI, PAF.  Clinical Impression  Pt presents to PT with deficits in balance, gait, strength, power, endurance. Pt with multiple losses of balance during session, most often without UE support. Pt with anterior loss of balance when standing in room and one lateral loss of balance during stair negotiation. PT provides reinforcement for utilization of RW for all standing activity. Pt also demonstrates scissoring pattern during gait, PT provides cues but pt has difficulty consistently implementing them. Pt will benefit from further gait and balance training to reduce falls risk.       Recommendations for follow up therapy are one component of a multi-disciplinary discharge planning process, led by the attending physician.  Recommendations may be updated based on patient status, additional functional criteria and insurance authorization.  Follow Up Recommendations Outpatient PT (PT recommends outpatient PT however pt is declining)    Assistance Recommended at Discharge Intermittent Supervision/Assistance  Patient can return home with the following  A little help with walking and/or transfers;Help with stairs or ramp for entrance    Equipment Recommendations None recommended by PT (pt ownd a RW)  Recommendations for Other Services       Functional Status Assessment Patient has had a recent decline in their functional status and demonstrates the ability to make significant improvements in function in a reasonable and predictable amount of time.     Precautions / Restrictions Precautions Precautions: Back Precaution Booklet Issued: Yes  (comment) Precaution Comments: reviewed precautions Required Braces or Orthoses:  (no brace needs per order) Restrictions Weight Bearing Restrictions: No      Mobility  Bed Mobility               General bed mobility comments: pt received and left sitting at the edge of bed    Transfers Overall transfer level: Needs assistance Equipment used: None Transfers: Sit to/from Stand Sit to Stand: Supervision                Ambulation/Gait Ambulation/Gait assistance: Min guard Gait Distance (Feet): 300 Feet Assistive device: Rolling walker (2 wheels) Gait Pattern/deviations: Step-through pattern, Scissoring Gait velocity: functional Gait velocity interpretation: 1.31 - 2.62 ft/sec, indicative of limited community ambulator   General Gait Details: pt with slowed step-through gait, scissoring and unable to consistently correct after PT verbal cues  Stairs Stairs: Yes Stairs assistance: Min assist Stair Management: One rail Left, Step to pattern Number of Stairs: 4    Wheelchair Mobility    Modified Rankin (Stroke Patients Only)       Balance Overall balance assessment: Needs assistance Sitting-balance support: No upper extremity supported, Feet supported Sitting balance-Leahy Scale: Good     Standing balance support: No upper extremity supported, During functional activity Standing balance-Leahy Scale: Poor Standing balance comment: pt with loss of balance anteriorly without UE support. No LOB noted with BUE support of walker                             Pertinent Vitals/Pain Pain Assessment Pain Assessment: No/denies pain    Home Living Family/patient expects to be discharged to:: Private residence Living Arrangements: Children Available Help at Discharge: Family;Available 24  hours/day Type of Home: House Home Access: Stairs to enter Entrance Stairs-Rails: Left Entrance Stairs-Number of Steps: 4   Home Layout: One level Home  Equipment: Conservation officer, nature (2 wheels);Cane - single point;Grab bars - tub/shower      Prior Function Prior Level of Function : Independent/Modified Independent;Driving;Working/employed               ADLs Comments: No assist needed for ADL/IADL, continues to work part time     [place]: Right    Extremity/Trunk Assessment   Upper Extremity Assessment Upper Extremity Assessment: Overall WFL for tasks assessed    Lower Extremity Assessment Lower Extremity Assessment: LLE deficits/detail LLE Deficits / Details: LLE grossly 4/5 at hip and knee    Cervical / Trunk Assessment Cervical / Trunk Assessment: Back Surgery  Communication   Communication: No difficulties  Cognition Arousal/Alertness: Awake/alert Behavior During Therapy: Impulsive Overall Cognitive Status: No family/caregiver present to determine baseline cognitive functioning                                 General Comments: likely at baseline however pt has difficulty retaining cues during gait training        General Comments General comments (skin integrity, edema, etc.): VSS on RA    Exercises     Assessment/Plan    PT Assessment Patient needs continued PT services  PT Problem List Decreased strength;Decreased balance;Decreased mobility       PT Treatment Interventions DME instruction;Gait training;Stair training;Functional mobility training;Therapeutic exercise;Balance training;Neuromuscular re-education;Patient/family education    PT Goals (Current goals can be found in the Care Plan section)  Acute Rehab PT Goals Patient Stated Goal: to go home PT Goal Formulation: With patient Time For Goal Achievement: 10/09/21 Potential to Achieve Goals: Good Additional Goals Additional Goal #1: Pt will score >45/56 on BERG balance test to indicate a reduced risk for falls    Frequency Min 5X/week     Co-evaluation               AM-PAC PT "6 Clicks" Mobility   Outcome Measure Help needed turning from your back to your side while in a flat bed without using bedrails?: None Help needed moving from lying on your back to sitting on the side of a flat bed without using bedrails?: None Help needed moving to and from a bed to a chair (including a wheelchair)?: A Little Help needed standing up from a chair using your arms (e.g., wheelchair or bedside chair)?: A Little Help needed to walk in hospital room?: A Little Help needed climbing 3-5 steps with a railing? : A Little 6 Click Score: 20    End of Session   Activity Tolerance: Patient tolerated treatment well Patient left: in bed;with call bell/phone within reach Nurse Communication: Mobility status PT Visit Diagnosis: Unsteadiness on feet (R26.81)    Time: [id number] PT Time Calculation (min) (ACUTE ONLY): 17 min   Charges:   PT Evaluation $PT Eval Low Complexity: [place], PT, DPT Acute Rehabilitation Pager: [phone] Office [phone]   Zenaida Niece 10/04/2021, 9:46 AM

## 2021-10-04 NOTE — Plan of Care (Signed)

## 2021-10-04 NOTE — Evaluation (Signed)
Occupational Therapy Evaluation Patient Details Name: Peter Mclean MRN: [id number] DOB: [birth year] Today's Date: 10/04/2021   History of Present Illness Patient is 71 yo M s/p L4-5 MIS laminectomy surgery: PMH includes: Anxiety,Arthritis, Chronic systolic CHF, Coronary artery disease, Dyslipidemia, History of inguinal hernia, Hypertension, Paroxysmal atrial fibrillation.   Clinical Impression   Patient admitted for the diagnosis and procedure above.  PTA he lives at home with his daughter.  She is available to assist as needed.  He generally did not use an AD for mobility, continues to work part time, and needed no assist with ADL/IADL at home.  Deficits are balance and back discomfort.  Currently he is needing generalized supervision and Min Guard times one for balance support in standing.  Patient encouraged to use he RW at home if needed.  All questions answered, and good understanding of precautions.  No further OT needs in the acute setting.         Recommendations for follow up therapy are one component of a multi-disciplinary discharge planning process, led by the attending physician.  Recommendations may be updated based on patient status, additional functional criteria and insurance authorization.   Follow Up Recommendations  No OT follow up    Assistance Recommended at Discharge Set up Supervision/Assistance  Patient can return home with the following      Functional Status Assessment  Patient has had a recent decline in their functional status and demonstrates the ability to make significant improvements in function in a reasonable and predictable amount of time.  Equipment Recommendations  None recommended by OT    Recommendations for Other Services       Precautions / Restrictions Precautions Precautions: Back Precaution Booklet Issued: Yes (comment) Precaution Comments: reviewed, impulsive, but verbalizes understanding. Restrictions Weight Bearing Restrictions: No       Mobility Bed Mobility Overal bed mobility: Modified Independent                  Transfers Overall transfer level: Needs assistance   Transfers: Sit to/from Stand Sit to Stand: Supervision                  Balance Overall balance assessment: Needs assistance Sitting-balance support: Feet supported Sitting balance-Leahy Scale: Good     Standing balance support: Bilateral upper extremity supported, Reliant on assistive device for balance Standing balance-Leahy Scale: Fair Standing balance comment: Mild dizziness noted                           ADL either performed or assessed with clinical judgement   ADL                                         General ADL Comments: Generalized supervision, with two LOB forward, able to self correct times one.  Without UE support.     Vision Baseline Vision/History: 1 Wears glasses Patient Visual Report: No change from baseline       Perception Perception Perception: Not tested   Praxis Praxis Praxis: Not tested    Pertinent Vitals/Pain Pain Assessment Pain Assessment: Faces Faces Pain Scale: Hurts a little bit Pain Location: incisional Pain Descriptors / Indicators: Tender Pain Intervention(s): Monitored during session     Hand Dominance Right   Extremity/Trunk Assessment Upper Extremity Assessment Upper Extremity Assessment: Overall WFL for tasks assessed   Lower Extremity  Assessment Lower Extremity Assessment: Defer to PT evaluation   Cervical / Trunk Assessment Cervical / Trunk Assessment: Back Surgery   Communication Communication Communication: No difficulties   Cognition Arousal/Alertness: Awake/alert Behavior During Therapy: WFL for tasks assessed/performed, Impulsive Overall Cognitive Status: Within Functional Limits for tasks assessed                                                        Home Living Family/patient expects to be  discharged to:: Private residence Living Arrangements: Children Available Help at Discharge: Family;Available 24 hours/day Type of Home: House Home Access: Stairs to enter [employer] of Steps: 4 Entrance Stairs-Rails: Left Home Layout: One level     Bathroom Shower/Tub: Chief Strategy Officer: Standard     Home Equipment: Agricultural consultant (2 wheels);Cane - single point;Grab bars - tub/shower          Prior Functioning/Environment Prior Level of Function : Independent/Modified Independent;Working/employed;Driving               ADLs Comments: No assist needed for ADL/IADL, continues to work part time        OT Problem List: Impaired balance (sitting and/or standing);Pain      OT Treatment/Interventions:      OT Goals(Current goals can be found in the care plan section) Acute Rehab OT Goals Patient Stated Goal: Return to work OT Goal Formulation: With patient Time For Goal Achievement: 10/18/21 Potential to Achieve Goals: Good  OT Frequency:      Co-evaluation              AM-PAC OT "6 Clicks" Daily Activity     Outcome Measure Help from another person eating meals?: None Help from another person taking care of personal grooming?: None Help from another person toileting, which includes using toliet, bedpan, or urinal?: A Little Help from another person bathing (including washing, rinsing, drying)?: A Little Help from another person to put on and taking off regular upper body clothing?: None Help from another person to put on and taking off regular lower body clothing?: A Little 6 Click Score: 21   End of Session Equipment Utilized During Treatment: Rolling walker (2 wheels)  Activity Tolerance: Patient tolerated treatment well Patient left: in chair;with call bell/phone within reach  OT Visit Diagnosis: Unsteadiness on feet (R26.81)                Time: 4287-6811 OT Time Calculation (min): 19 min Charges:  OT General  Charges $OT Visit: 1 Visit OT Evaluation $OT Eval Moderate Complexity: 1 Mod  10/04/2021  RP, OTR/L  Acute Rehabilitation Services  Office:  [phone]   Suzanna Obey 10/04/2021, 9:13 AM

## 2021-10-04 NOTE — Progress Notes (Signed)
Patient awaiting transport via wheelchair by volunteer for discharge home; in no acute distress nor complaints of pain nor discomfort; incision on his back with honeycomb dressing and is clean, dry and intact; room was checked and accounted for all his belongings; discharge instructions concerning his medications, incision care, follow up appointment and when to call the doctor as needed were all discussed with patient and he expressed understanding on the instructions given.

## 2021-10-04 NOTE — Progress Notes (Signed)
Neurosurgery Service Progress Note  Subjective: No acute events overnight, preop symptoms completely resolved, minimal back soreness, he's very happy   Objective: Vitals:   10/03/21 2015 10/03/21 2044 10/03/21 2320 10/04/21 0448  BP: 125/72  101/75 131/86  Pulse: (!) 48 (!) 50 82 (!) 110  Resp: 18  18 18   Temp: 98.1 F (36.7 C)  97.9 F (36.6 C) 97.7 F (36.5 C)  TempSrc: Oral  Oral Oral  SpO2: 97%  95% 96%  Weight:      Height:        Physical Exam: Strength 5/5 x4 and SILTx4 except baseline L L5 numbness and 4+/5 TA/EHL weakness on the left  Assessment & Plan: 71 y.o. man s/p MIS decompression, recovering well.  -discharge home today  66  10/04/21 7:40 AM

## 2021-11-02 NOTE — Progress Notes (Signed)
Cardiology Office Note Date:  11/02/2021  Patient ID:  Peter Mclean, Peter Mclean Nov 06, 1950, MRN [id number] PCP:  Christain Sacramento, MD  Electrophysiologist:  Dr. Rayann Heman   Chief Complaint: planned follow up  History of Present Illness: Peter Mclean is a 71 y.o. male with history of HTN, NICM, chronic CHF (systolic) with recovered LVEF, bradycardia requiring down-titration of meds, and persistent Afib s/p PVI ablation in 2105, smoker, CAD (NSTEMI March 2022, LAD stent)  Remote visit with Dr. Rayann Heman  He mentions a hx of TIA "some years ago" treated at [place], saw Dr. Mee Hives, neurologist.  Reports on his Eliquis at that time, no changes were made.  In review of notes, Dr. Rayann Heman noted: "I have spoken with the patient's neurologist.  The patient has prior infarct on MRI however Dr Mee Hives (neurology) feels that recent migraine in October could have been a microhemorrhage.  Dr Mee Hives was not certain whether risks/ benefits favored stopping eliquis or not.  Chads2vasc score is at least 4.  I had a long discussion with the patient about options.  I did offer ILR with monitoring for further afib off of anticoagulation as an option.  The patient states that he is currently doing well and favors continuing anticoagulation with eliquis though he does understand risks of bleeding."  PMSxHx reports loop implant though the patient states he never decided to pursue the implant.  Unfortunately I am unable to remove it, (our staff is working on getting this corrected)   He saw Dr. Rayann Heman Aug 2021, doing well,, no symptoms of AF.  Noted some edema, venous insufficiency, recommended support stockings, low NA diet and planned to update his  Recommended 48mof/u. TTE noted LVEF 55-50%, no WMA, RV ok, he was in AF during the study, 115bpm    I saw him 10/26/20 The patient says he thought today's visit was a routine check in. He did see his PMD last week and mentioned to him his HR had a skip, but did not get the  impression he was concerned about it. He feels quite well. He has gone back to work for the last 3 years since his wife passed, works at a [employer] is on his feet always on the move and feels like he has very good exertional capacity, only getting winded when heavy lifting or carrying heavy things. No CP. He denies any cardiac awareness, no palpitations of any kind. No near syncope or syncope. Rarely with banding to standing he will get fleetingly lightheaded. No bleeding or signs of bleeding. His really only physical complaint is intermittently his L knee swells and is painful, nows he has arthritis in both his knees Planned for monitor to establish Afib burden, he was unaware that he was I AFib at the time of the visit  Hospitalized 11/27/20 - 11/30/20 w/NSTEMI, cath w/PCI to LAD DCCV 12/13/20 not successful Hospital visit 12/21/20 with CP, associated with time of an argument, HS Trop 27, 20, low suspicion for instent issues, and suspectedhis AFib and stress, discharged with lasix and increased amio dose noting he was in/out of AFib  Saw Dr. ARayann HemanApril 2022, not an ablation candidate, had rash, Plavix stopped > Brilinta (resolved), he was in AFib, amio continued.  [facility]/21 [facility] discussed to repeat echo in a few months in [place] to re-evaluate his EF  TTE June 2022 recovered LVEF 60-65%  I saw him Jan 2023 He is having terrible back pain. Says the day  after he got ome from his hospital stay in March  while having a BM fekt  a strong POP and pain in his low back. Since then he has had escalating back pain, was able to much/most of his usual activities and uties at work until about 3 months ago.  His back pain has steadily worsened with times that he can not getout of bed, or even roll over in bed without pain. He has some days that are better, was at work yesterday and able to walk around the shop, but more oftne then not very limited in what he is able to do because of the  pain. He has not had any CP, palpitations or cardiac awareness when he was up and around or now He denies bleeding or signs of bleeding For the most part compliant with the Eliquis, though when he went to the ER a couple weeks ago for his pain he held it a week because he thought they may do his surgery sooner. He is instructed to hold 3 days prior to his surgery  HE HAS NOT [place] for 4-5 months, 2/2 cost, says he/his daughter called about it but never heard back In d/w DOD, planned for stress myoview pre-op Myoview was low risk, he was felt to be acceptable cardiac risk for back surgery Planned to refer to Dr. Gasper Sells for general cardiology follow up   TODAY Back is sore, but improving. No CP, palpitations or cardiac awareness He feels dizzy, not lightheaded but off balance.   Timing is unclear, says perhaps a month but then dates back to the time of his PCI He does not feel like he is going to faint, has not had syncope  AF history: 06/13/14, EPS, ablation (PVI, RA/SVC junction, and CTI)  AAD Hx Amiodarone stopped Oct 2015, notes indicate abn LFTs, jaundice, liver failure, ? Gallstone Amiodarone restarted march 2022 during hospitalization w/NSTEMI and RVR   Past Medical History:  Diagnosis Date   Anxiety    Arthritis    Chronic systolic CHF (congestive heart failure) ([place])    a. Dx 09/2013 in setting of AF-RVR; EF 10-15%, possibly tachy-mediated. Cath 09/2013: normal cors. b. TEE: EF 20-25% by echo 10/2013.   Coronary artery disease    Dyslipidemia (high LDL; low HDL)    Elevated liver enzymes    H/O hematuria    History of inguinal hernia    Hypertension    NICM (nonischemic cardiomyopathy) (HCC)    Non-compliance    a. Adm 09/2013 after having stopped his meds.   NSTEMI (non-ST elevated myocardial infarction) ([place]) 11/27/2020   LAD stent   Paroxysmal atrial fibrillation (HCC)    Paroxysmal atrial flutter (HCC)    typical appearing   Pseudoaneurysm  following procedure ([place])    a. RLE pseudoaneurysm after cath, spontaneously decreased in size, did not require compression.   Sinus bradycardia    a. HR 40's in ER 11/2013 - digoxin stopped, Coreg decreased.   Tobacco abuse     Past Surgical History:  Procedure Laterality Date   ABLATION  06/13/2014   PVI and CTI by Dr Rayann Heman   APPENDECTOMY  1970   ATRIAL FIBRILLATION ABLATION N/A 06/13/2014   Procedure: ATRIAL FIBRILLATION ABLATION;  Surgeon: Coralyn Mark, MD;  Location: [facility] CATH LAB;  Service: Cardiovascular;  Laterality: N/A;   CARDIOVERSION N/A 10/17/2013   Procedure: CARDIOVERSION;  Surgeon: Sanda Klein, MD;  Location: [facility] ENDOSCOPY;  Service: Cardiovascular;  Laterality: N/A;   CARDIOVERSION  N/A 12/13/2020   Procedure: CARDIOVERSION;  Surgeon: Fay Records, MD;  Location: [facility] ENDOSCOPY;  Service: Cardiovascular;  Laterality: N/A;   CHOLECYSTECTOMY     CORONARY STENT INTERVENTION N/A 11/29/2020   Procedure: CORONARY STENT INTERVENTION;  Surgeon: Lorretta Harp, MD;  Location: [place] CV LAB;  Service: Cardiovascular;  Laterality: N/A;   HERNIA REPAIR  1992 & 2011   INTRAVASCULAR IMAGING/OCT N/A 11/29/2020   Procedure: INTRAVASCULAR IMAGING/OCT;  Surgeon: Lorretta Harp, MD;  Location: [place] CV LAB;  Service: Cardiovascular;  Laterality: N/A;   LEFT AND RIGHT HEART CATHETERIZATION WITH CORONARY ANGIOGRAM N/A 10/13/2013   Procedure: LEFT AND RIGHT HEART CATHETERIZATION WITH CORONARY ANGIOGRAM;  Surgeon: Leonie Man, MD;  Location: [facility] CATH LAB;  Service: Cardiovascular;  Laterality: N/A;   LEFT HEART CATH AND CORONARY ANGIOGRAPHY N/A 11/29/2020   Procedure: LEFT HEART CATH AND CORONARY ANGIOGRAPHY;  Surgeon: Lorretta Harp, MD;  Location: [place] CV LAB;  Service: Cardiovascular;  Laterality: N/A;   LUMBAR LAMINECTOMY/ DECOMPRESSION WITH MET-RX N/A 10/03/2021   Procedure: Lumbar four-five Minimally Invasive  Decompression with Metrx;  Surgeon: Judith Part, MD;  Location: [place];  Service: Neurosurgery;  Laterality: N/A;   PSEUDOANERYSM COMPRESSION  11/11/2013   Procedure: PSEUDOANERYSM COMPRESSION;  Surgeon: Fay Records, MD;  Location: [facility] CATH LAB;  Service: Cardiovascular;;   TEE WITHOUT CARDIOVERSION N/A 10/17/2013   Procedure: TRANSESOPHAGEAL ECHOCARDIOGRAM (TEE);  Surgeon: Sanda Klein, MD;  Location: [facility] ENDOSCOPY;  Service: Cardiovascular;  Laterality: N/A;   TEE WITHOUT CARDIOVERSION N/A 06/12/2014   Procedure: TRANSESOPHAGEAL ECHOCARDIOGRAM (TEE);  Surgeon: Fay Records, MD;  Location: [facility] ENDOSCOPY;  Service: Cardiovascular;  Laterality: N/A;    Current Outpatient Medications  Medication Sig Dispense Refill   albuterol (VENTOLIN HFA) 108 (90 Base) MCG/ACT inhaler 1-2 puffs every 6 (six) hours as needed for wheezing.     ALPRAZolam (XANAX) 1 MG tablet Take 1 mg by mouth at bedtime.     amiodarone (PACERONE) 200 MG tablet Take 1 tablet (200 mg total) by mouth daily. (Patient taking differently: Take 200 mg by mouth every morning.) 90 tablet 1   apixaban (ELIQUIS) 5 MG TABS tablet Take 1 tablet (5 mg total) by mouth 2 (two) times daily. Restart on 10/06/21 60 tablet 5   atorvastatin (LIPITOR) 80 MG tablet Take 1 tablet (80 mg total) by mouth daily. (Patient taking differently: Take 80 mg by mouth every morning.) 90 tablet 3   diclofenac Sodium (VOLTAREN) 1 % GEL Apply 2 g topically 4 (four) times daily. (Patient taking differently: Apply 1 application topically 2 (two) times daily.) 50 g 0   gabapentin (NEURONTIN) 300 MG capsule Take 300 mg by mouth 2 (two) times daily.     HYDROcodone-acetaminophen (NORCO/VICODIN) 5-325 MG tablet Take 1 tablet by mouth every 4 (four) hours as needed for pain.     lidocaine (LIDODERM) 5 % Place 1 patch onto the skin daily. Remove & Discard patch within 12 hours or as directed by MD (Patient taking differently: Place 1 patch onto the skin daily as needed (pain). Remove & Discard patch within 12 hours  or as directed by MD) 30 patch 0   metoprolol succinate (TOPROL-XL) 100 MG 24 hr tablet TAKE 1 TABLET (100 MG TOTAL) BY MOUTH IN THE MORNING AND AT BEDTIME. (Patient taking differently: 100 mg every morning.) 180 tablet 3   nitroGLYCERIN (NITROSTAT) 0.4 MG SL tablet Place 1 tablet (0.4 mg total) under the tongue every  5 (five) minutes as needed for chest pain. 25 tablet 3   spironolactone (ALDACTONE) 25 MG tablet Take 0.5 tablets (12.5 mg total) by mouth daily. (Patient taking differently: Take 12.5 mg by mouth every morning.) 30 tablet 11   tamsulosin (FLOMAX) 0.4 MG CAPS capsule Take 0.4 mg by mouth daily after supper.     No current facility-administered medications for this visit.    Allergies:   Tramadol and Clopidogrel   Social History:  The patient  reports that he has been smoking cigarettes. He has a 10.00 pack-year smoking history. He has never used smokeless tobacco. He reports that he does not currently use alcohol. He reports that he does not use drugs.   Family History:  The patient's family history includes Heart attack in his father; Hyperlipidemia in his mother; Hypertension in his mother.  ROS:  Please see the history of present illness.  All other systems are reviewed and otherwise negative.   PHYSICAL EXAM: VS:  There were no vitals taken for this visit. BMI: There is no height or weight on file to calculate BMI. Well nourished, well developed, in no acute distress  HEENT: normocephalic, atraumatic  Neck: no JVD, carotid bruits or masses Cardiac: RRR; no significant murmurs, no rubs, or gallops Lungs: CTA b/l, no wheezing, rhonchi or rales  Abd: soft, nontender MS: no deformity or atrophy, though has thin body habitus Ext: no edema  Skin: warm and dry, no rash Neuro:  No gross deficits appreciated Psych: euthymic mood, full affect   EKG:  done today and reviewed by myself SB 50bpm, borderline 1st degree AVblock, 228m  09/27/21: stress myoview   The study is  normal. The study is low risk.   No ST deviation was noted.   LV perfusion is normal. There is no evidence of ischemia. There is no evidence of infarction.   Left ventricular function is normal. Nuclear stress EF: 64 %. The left ventricular ejection fraction is normal (55-65%). End diastolic cavity size is normal.   Prior study not available for comparison.   Reduced apical counts with normal wall motion consistent with apical thinning artifact.  Normal study without ischemia or infarction.  Normal LVEF, 64%.  This is a low-risk study.    02/15/2021: TTE IMPRESSIONS   1. Left ventricular ejection fraction, by estimation, is 60 to 65%. The  left ventricle has normal function. The left ventricle has no regional  wall motion abnormalities. Left ventricular diastolic function could not  be evaluated.   2. Right ventricular systolic function is mildly reduced. The right  ventricular size is normal. Tricuspid regurgitation signal is inadequate  for assessing PA pressure.   3. The mitral valve is normal in structure. Trivial mitral valve  regurgitation. No evidence of mitral stenosis.   4. The aortic valve is normal in structure. Aortic valve regurgitation is  not visualized. No aortic stenosis is present.    Left heart cath 11/29/20: Mid LAD lesion is 99% stenosed. Prox LAD to Mid LAD lesion is 50% stenosed. Post intervention, there is a 0% residual stenosis. Post intervention, there is a 0% residual stenosis. A drug-eluting stent was successfully placed using a STENT RESOLUTE ONYX 3.0X30. There is mild to moderate left ventricular systolic dysfunction. The left ventricular ejection fraction is 45-50% by visual estimate. LV end diastolic pressure is normal.   IMPRESSION: Mr. WPopoffhad successful orbital atherectomy followed by OCT guided PCI and drug-eluting stenting.  This revealed a ruptured plaque in the proximal LAD  most likely responsible for his non-STEMI with a 99% calcified plaque  just distal to this.  The final angiographic result was reduction of a 99% calcified lesion to 0% residual TIMI-3 flow.  The patient tolerated the procedure well.  The guidewire and catheter removed.  The sheath was removed and a TR band was placed on the right wrist to achieve patent hemostasis.  The patient did receive a bolus of Aggrastat and he will have a 6-hour infusion given the thrombus demonstrated on OCT.  He will need dual antiplatelet therapy uninterrupted for 12 months.  Echo 11/29/20:  1. Left ventricular ejection fraction, by estimation, is 30 to 35%. The  left ventricle has moderately decreased function. The left ventricle  demonstrates global hypokinesis. Left ventricular diastolic function could  not be evaluated. There is of the  left ventricular,.   2. Right ventricular systolic function is normal. The right ventricular  size is normal.   3. The mitral valve is normal in structure. Mild mitral valve  regurgitation.   4. The aortic valve is normal in structure. Aortic valve regurgitation is  not visualized. No aortic stenosis is present.   05/25/20: TTE IMPRESSIONS   1. Left ventricular ejection fraction, by estimation, is 55 to 60%. The  left ventricle has normal function. The left ventricle has no regional  wall motion abnormalities. Left ventricular diastolic function could not  be evaluated.   2. Right ventricular systolic function is normal. The right ventricular  size is normal.   3. The mitral valve is normal in structure. Trivial mitral valve  regurgitation. No evidence of mitral stenosis.   4. The aortic valve is normal in structure. Aortic valve regurgitation is  not visualized. No aortic stenosis is present.   5. The inferior vena cava is normal in size with greater than 50%  respiratory variability, suggesting right atrial pressure of 3 mmHg.   Comparison(s): No prior Echocardiogram. Atrial fibrillation with average  ventricular rate 115 bpm at rest during  the study.    05/01/15: TTE Study Conclusions - Left ventricle: The cavity size was normal. Wall thickness was   normal. Systolic function was normal. Wall motion was normal;   there were no regional wall motion abnormalities. - Left atrium: The atrium was mildly dilated. - Right ventricle: The cavity size was normal. Wall thickness was   normal. Systolic function was normal. - Tricuspid valve: There was trivial regurgitation. - Pulmonary arteries: PA peak pressure: 21 mm Hg (S).   06/13/14:EPS/ablation CONCLUSIONS: 1. Sinus rhythm upon presentation.   2. 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  There was a right middle pulmonary vein which was isolated with the right inferior pulmonary vein.  The right superior pulmonary vein appeared to be the culprit vein and afib was terminated with isolation of this vein.  Additional right atrial ablation was performed at the junction of the SVC and RA. 3. Successful electrical isolation and anatomical encircling of all four pulmonary veins with radiofrequency current.    4. Cavo-tricuspid isthmus ablation was performed with complete bidirectional isthmus block achieved.  5. No inducible arrhythmias following ablation both on and off of dobutamine 6. No early apparent complications.    Recent Labs: 11/27/2020: Magnesium 1.7 12/21/2020: B Natriuretic Peptide 336.3 09/27/2021: ALT 32; BUN 21; Creatinine, Ser 1.19; Potassium 4.9; Sodium 139; TSH 2.500 10/01/2021: Hemoglobin 13.9; Platelets 207  11/28/2020: Cholesterol 152; HDL 36; LDL Cholesterol 103; Total  CHOL/HDL Ratio 4.2; Triglycerides 64; VLDL 13   CrCl cannot be calculated (Patient's most recent lab result is older than the maximum 21 days allowed.).   Wt Readings from Last 3 Encounters:  10/03/21 135 lb (61.2 kg)  10/01/21 135 lb 3.2 oz (61.3 kg)  09/27/21 135 lb (61.2 kg)     Other studies  reviewed: Additional studies/records reviewed today include: summarized above  ASSESSMENT AND PLAN:  1. Persistant AFib     CHA2DS2Vasc is 5, on Eliquis, appropriately dosed      on amiodarone, maintaining SR     Labs are UTD  He is taking the Toprol 169m DAILY only and has been for many months With HR at 50 will reduce to 567mdaily Recheck on his BP by myself was 142/76      2. NICM/ICM     June 2022, normalized LVEF      On BB/aldactone, no exam findings or symptoms to suggest fluid OL      Stress test noted improved LVEF  3. HTN     As above  4. CAD S/p NSTEMI/PCI march 2022 Discussed with Dr. BeGwenlyn Foundiven he did his cath, given he had been so many months off DAPT and nearing the 1 year mark,  Will resume ASA alone post back surgery He sees cardiology in April  5. HLD Not addressed today  6. Dizzy, off balance Suspect meds, gabapentin, pain med BP is ok, bradycardic, reduce toprol as above     Disposition: EP in 55m11moooner if needed, keep cardiology in April as scheduled     Current medicines are reviewed at length with the patient today.  The patient did not have any concerns regarding medicines.  SigVenetia NightA-C 11/02/2021 10:21 AM     [facility] [address] [place] [phone])  [phone])

## 2021-11-05 ENCOUNTER — Encounter: Payer: Self-pay | Admitting: Physician Assistant

## 2021-11-05 ENCOUNTER — Other Ambulatory Visit: Payer: Self-pay

## 2021-11-05 ENCOUNTER — Ambulatory Visit: Payer: Medicare HMO | Admitting: Physician Assistant

## 2021-11-05 VITALS — BP 152/80 | HR 50 | Ht 67.0 in | Wt 137.0 lb

## 2021-11-05 DIAGNOSIS — I480 Paroxysmal atrial fibrillation: Secondary | ICD-10-CM | POA: Diagnosis not present

## 2021-11-05 DIAGNOSIS — I428 Other cardiomyopathies: Secondary | ICD-10-CM

## 2021-11-05 DIAGNOSIS — Z79899 Other long term (current) drug therapy: Secondary | ICD-10-CM | POA: Diagnosis not present

## 2021-11-05 DIAGNOSIS — I2510 Atherosclerotic heart disease of native coronary artery without angina pectoris: Secondary | ICD-10-CM | POA: Diagnosis not present

## 2021-11-05 DIAGNOSIS — I10 Essential (primary) hypertension: Secondary | ICD-10-CM

## 2021-11-05 DIAGNOSIS — R42 Dizziness and giddiness: Secondary | ICD-10-CM

## 2021-11-05 MED ORDER — METOPROLOL SUCCINATE ER 100 MG PO TB24: 50.0000 mg | ORAL_TABLET | Freq: Every day | ORAL | 3 refills | Status: AC

## 2021-11-05 NOTE — Patient Instructions (Signed)
Medication Instructions:   START TAKING 81 MG BABY ASPIRIN ONCE A DAY  START  TAKING  METOPROLOL 50 MG ONCE A DAY   *If you need a refill on your cardiac medications before your next appointment, please call your pharmacy*   Lab Work: NONE ORDERED  TODAY   If you have labs (blood work) drawn today and your tests are completely normal, you will receive your results only by: MyChart Message (if you have MyChart) OR A paper copy in the mail If you have any lab test that is abnormal or we need to change your treatment, we will call you to review the results.   Testing/Procedures: NONE ORDERED  TODAY   Follow-Up: At [facility], you and your health needs are our priority.  As part of our continuing mission to provide you with exceptional heart care, we have created designated Provider Care Teams.  These Care Teams include your primary Cardiologist (physician) and Advanced Practice Providers (APPs -  Physician Assistants and Nurse Practitioners) who all work together to provide you with the care you need, when you need it.  We recommend signing up for the patient portal called "MyChart".  Sign up information is provided on this After Visit Summary.  MyChart is used to connect with patients for Virtual Visits (Telemedicine).  Patients are able to view lab/test results, encounter notes, upcoming appointments, etc.  Non-urgent messages can be sent to your provider as well.   To learn more about what you can do with MyChart, go to [email or web address].    Your next appointment:   6 month(s)  The format for your next appointment:   In Person  Provider:   You may see Dr. Lalla Brothers .  or one of the following Advanced Practice Providers on your designated Care Team:   Francis Dowse, [place] "Mardelle Matte" Lanna Poche, [place]    Other Instructions

## 2021-11-14 ENCOUNTER — Other Ambulatory Visit: Payer: Self-pay

## 2021-11-14 MED ORDER — ATORVASTATIN CALCIUM 80 MG PO TABS: 80.0000 mg | ORAL_TABLET | Freq: Every day | ORAL | 3 refills | Status: DC

## 2021-11-25 ENCOUNTER — Other Ambulatory Visit: Payer: Self-pay | Admitting: General Practice

## 2021-12-31 NOTE — Progress Notes (Signed)
?Cardiology Office Note:   ? ?Date:  01/01/2022  ? ?ID:  Peter Mclean, DOB 08-Nov-1950, MRN [id number] ? ?PCP:  Christain Sacramento, MD ?  ?[facility] Providers ?Cardiologist:  Thompson Grayer, MD ?Electrophysiology APP:  Sherran Needs, NP    ? ?Referring MD: Christain Sacramento, MD  ? ?CC:  CAD f/u ? ?History of Present Illness:   ? ?Peter Mclean is a 71 y.o. male with a hx of CAD with pLAD NSTEMI 11/2021, HFrEF EF (10%-> 30-35% at NSTEMI-> normal 6/22) persistent AF s/p PVI ablation in 2015, former smoking, and possible remote stroke.  Plavix allergy Rash able to tolerate Brilinta but with some skin peeling.  Amiodarone liver concern in the past but tolerated well 2022. ? ?Patient notes that he is doing well.   ?Had Normal Stress test 2023 then Back surgery this March.  Soreness has improved.  He is back to work.  Has decreased range of motion. ?There are no interval hospital/ED visit.   ? ?No chest pain or pressure .  No SOB/DOE and no PND/Orthopnea.  No weight gain or leg swelling.  No palpitations or syncope  No bleeding issues but easy bruising.  Notes bilateral LE edema.  Notes bilteral LE pain with walking. ? ?AMB BP: ~128/86 ? ? ?Past Medical History:  ?Diagnosis Date  ? Anxiety   ? Arthritis   ? Chronic systolic CHF (congestive heart failure) ([place])   ? a. Dx 09/2013 in setting of AF-RVR; EF 10-15%, possibly tachy-mediated. Cath 09/2013: normal cors. b. TEE: EF 20-25% by echo 10/2013.  ? Coronary artery disease   ? Dyslipidemia (high LDL; low HDL)   ? Elevated liver enzymes   ? H/O hematuria   ? History of inguinal hernia   ? Hypertension   ? NICM (nonischemic cardiomyopathy) ([place])   ? Non-compliance   ? a. Adm 09/2013 after having stopped his meds.  ? NSTEMI (non-ST elevated myocardial infarction) ([place]) 11/27/2020  ? LAD stent  ? Paroxysmal atrial fibrillation ([facility])   ? Paroxysmal atrial flutter ([place])   ? typical appearing  ? Pseudoaneurysm following procedure ([place])   ? a. RLE pseudoaneurysm after cath, spontaneously  decreased in size, did not require compression.  ? Sinus bradycardia   ? a. HR 40's in ER 11/2013 - digoxin stopped, Coreg decreased.  ? Tobacco abuse   ? ? ?Past Surgical History:  ?Procedure Laterality Date  ? ABLATION  06/13/2014  ? PVI and CTI by Dr Rayann Heman  ? APPENDECTOMY  1970  ? ATRIAL FIBRILLATION ABLATION N/A 06/13/2014  ? Procedure: ATRIAL FIBRILLATION ABLATION;  Surgeon: Coralyn Mark, MD;  Location: [facility];  Service: Cardiovascular;  Laterality: N/A;  ? CARDIOVERSION N/A 10/17/2013  ? Procedure: CARDIOVERSION;  Surgeon: Sanda Klein, MD;  Location: [facility];  Service: Cardiovascular;  Laterality: N/A;  ? CARDIOVERSION N/A 12/13/2020  ? Procedure: CARDIOVERSION;  Surgeon: Fay Records, MD;  Location: [facility];  Service: Cardiovascular;  Laterality: N/A;  ? CHOLECYSTECTOMY    ? CORONARY STENT INTERVENTION N/A 11/29/2020  ? Procedure: CORONARY STENT INTERVENTION;  Surgeon: Lorretta Harp, MD;  Location: [facility] CV [facility];  Service: Cardiovascular;  Laterality: N/A;  ? [place] & 2011  ? INTRAVASCULAR IMAGING/OCT N/A 11/29/2020  ? Procedure: INTRAVASCULAR IMAGING/OCT;  Surgeon: Lorretta Harp, MD;  Location: [facility];  Service: Cardiovascular;  Laterality: N/A;  ? LEFT AND RIGHT HEART CATHETERIZATION WITH CORONARY ANGIOGRAM N/A 10/13/2013  ?  Procedure: LEFT AND RIGHT HEART CATHETERIZATION WITH CORONARY ANGIOGRAM;  Surgeon: Leonie Man, MD;  Location: [facility] C[facility] LAB;  Service: Cardiovascular;  Laterality: N/A;  ? LEFT HEART CATH AND CORONARY ANGIOGRAPHY N/A 11/29/2020  ? Procedure: LEFT HEART CATH AND CORONARY ANGIOGRAPHY;  Surgeon: Lorretta Harp, MD;  Location: [facility] CV LAB;  Service: Cardiovascular;  Laterality: N/A;  ? LUMBAR LAMINECTOMY/ DECOMPRESSION WITH MET-RX N/A 10/03/2021  ? Procedure: Lumbar four-five Minimally Invasive  Decompression with Metrx;  Surgeon: Judith Part, MD;  Location: [facility];  Service: Neurosurgery;  Laterality: N/A;  ?  PSEUDOANERYSM COMPRESSION  11/11/2013  ? Procedure: PSEUDOANERYSM COMPRESSION;  Surgeon: Fay Records, MD;  Location: [facility] CATH LAB;  Service: Cardiovascular;;  ? TEE WITHOUT CARDIOVERSION N/A 10/17/2013  ? Procedure: TRANSESOPHAGEAL ECHOCARDIOGRAM (TEE);  Surgeon: Sanda Klein, MD;  Location: [facility];  Service: Cardiovascular;  Laterality: N/A;  ? TEE WITHOUT CARDIOVERSION N/A 06/12/2014  ? Procedure: TRANSESOPHAGEAL ECHOCARDIOGRAM (TEE);  Surgeon: Fay Records, MD;  Location: [facility];  Service: Cardiovascular;  Laterality: N/A;  ? ? ?Current Medications: ?Current Meds  ?Medication Sig  ? albuterol (VENTOLIN HFA) 108 (90 Base) MCG/ACT inhaler 1-2 puffs every 6 (six) hours as needed for wheezing.  ? ALPRAZolam (XANAX) 1 MG tablet Take 1 mg by mouth at bedtime.  ? amiodarone (PACERONE) 200 MG tablet Take 1 tablet (200 mg total) by mouth daily.  ? apixaban (ELIQUIS) 5 MG TABS tablet Take 1 tablet (5 mg total) by mouth 2 (two) times daily. Restart on 10/06/21  ? atorvastatin (LIPITOR) 80 MG tablet Take 1 tablet (80 mg total) by mouth daily.  ? gabapentin (NEURONTIN) 300 MG capsule Take 300 mg by mouth 3 (three) times daily.  ? HYDROcodone-acetaminophen (NORCO/VICODIN) 5-325 MG tablet Take 1 tablet by mouth every 4 (four) hours as needed for pain.  ? metoprolol succinate (TOPROL-XL) 100 MG 24 hr tablet Take 0.5 tablets (50 mg total) by mouth daily. Take with or immediately following a meal.  ? nitroGLYCERIN (NITROSTAT) 0.4 MG SL tablet PLACE 1 TABLET UNDER THE TONGUE EVERY 5 MINUTES AS NEEDED FOR CHEST PAIN.  ? spironolactone (ALDACTONE) 25 MG tablet Take 0.5 tablets (12.5 mg total) by mouth daily.  ? tamsulosin (FLOMAX) 0.4 MG CAPS capsule Take 0.4 mg by mouth daily after supper.  ? [DISCONTINUED] aspirin EC 81 MG tablet Take 81 mg by mouth daily. Swallow whole.  ?  ? ?Allergies:   Tramadol and Clopidogrel  ? ?Social History  ? ?Socioeconomic History  ? Marital status: Widowed  ?  Spouse name: Not on file  ?  Number of children: 2  ? Years of education: Not on file  ? Highest education level: Not on file  ?Occupational History  ? Occupation: Administrator, sports in [place]  ?  Comment: Engineer, civil (consulting)  ? Occupation:    ? Occupation:    ?Tobacco Use  ? Smoking status: Some Days  ?  Packs/day: 0.25  ?  Years: 40.00  ?  Pack years: 10.00  ?  Types: Cigarettes  ?  Passive exposure: Never  ? Smokeless tobacco: Never  ? Tobacco comments:  ?  Has cut back to 2-3 cigarettes per day.  ?Vaping Use  ? Vaping Use: Never used  ?Substance and Sexual Activity  ? Alcohol use: Not Currently  ? Drug use: Never  ? Sexual activity: Not on file  ?Other Topics Concern  ? Not on file  ?Social History Narrative  ? Lives in [place]   ? ?  Social Determinants of Health  ? ?Financial Resource Strain: Not on file  ?Food Insecurity: Not on file  ?Transportation Needs: Not on file  ?Physical Activity: Not on file  ?Stress: Not on file  ?Social Connections: Not on file  ?  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 ?Family History: ?The patient's family history includes Heart attack in his father; Hyperlipidemia in his mother; Hypertension in his mother. ? ?ROS:   ?Please see the history of present illness.    ? All other systems reviewed and are negative. ? ?EKGs/Labs/Other Studies Reviewed:   ? ?The following studies were reviewed today: ? ?Recent Labs: ?09/27/2021: ALT 32; BUN 21; Creatinine, Ser 1.19; Potassium 4.9; Sodium 139; TSH 2.500 ?10/01/2021: Hemoglobin 13.9; Platelets 207  ?Recent Lipid Panel ?   ?Component Value Date/Time  ? CHOL 152 11/28/2020 0107  ? TRIG 64 11/28/2020 0107  ? HDL 36 (L) 11/28/2020 0107  ? CHOLHDL 4.2 11/28/2020 0107  ? VLDL 13 11/28/2020 0107  ? [place] 103 (H) 11/28/2020 0107  ? ?    ? ?Physical Exam:   ? ?VS:  BP 124/68   Pulse (!) 59   Ht _0  (1.676 m)   Wt 141 lb (64 kg)   SpO2 99%   BMI 22.76 kg/m?    ? ?Wt Readings from Last 3 Encounters:  ?01/01/22 141 lb (64 kg)  ?11/05/21 137 lb (62.1  kg)  ?10/03/21 135 lb (61.2 kg)  ?  ? ?GEN:  Well nourished, well developed in no acute distress ?HEENT: Normal ?NECK: No JVD; ?LYMPHATICS: No lymphadenopathy ?CARDIAC: RRR, no murmurs, rubs, gallops ?RESPIRAT

## 2022-01-01 ENCOUNTER — Ambulatory Visit: Payer: Medicare HMO | Admitting: Internal Medicine

## 2022-01-01 ENCOUNTER — Encounter: Payer: Self-pay | Admitting: Internal Medicine

## 2022-01-01 VITALS — BP 124/68 | HR 59 | Ht 66.0 in | Wt 141.0 lb

## 2022-01-01 DIAGNOSIS — I480 Paroxysmal atrial fibrillation: Secondary | ICD-10-CM | POA: Diagnosis not present

## 2022-01-01 DIAGNOSIS — I2510 Atherosclerotic heart disease of native coronary artery without angina pectoris: Secondary | ICD-10-CM | POA: Insufficient documentation

## 2022-01-01 DIAGNOSIS — I739 Peripheral vascular disease, unspecified: Secondary | ICD-10-CM | POA: Diagnosis not present

## 2022-01-01 DIAGNOSIS — I5089 Other heart failure: Secondary | ICD-10-CM | POA: Insufficient documentation

## 2022-01-01 NOTE — Patient Instructions (Signed)
Medication Instructions:  ?Your physician has recommended you make the following change in your medication:  ?STOP: Aspirin ? ?*If you need a refill on your cardiac medications before your next appointment, please call your pharmacy* ? ? ?Lab Work: ?TODAY: FLP, BNP, CMP ?If you have labs (blood work) drawn today and your tests are completely normal, you will receive your results only by: ?MyChart Message (if you have MyChart) OR ?A paper copy in the mail ?If you have any lab test that is abnormal or we need to change your treatment, we will call you to review the results. ? ? ?Testing/Procedures: ?Your physician has requested that you have an ankle brachial index (ABI). During this test an ultrasound and blood pressure cuff are used to evaluate the arteries that supply the arms and legs with blood. Allow thirty minutes for this exam. There are no restrictions or special instructions.  ?Your physician has requested that you have a lower extremity arterial duplex. This test is an ultrasound of the arteries in the legs. It looks at arterial blood flow in the legs.  There are no restrictions or special instructions  ? ? ?Follow-Up: ?At [facility], you and your health needs are our priority.  As part of our continuing mission to provide you with exceptional heart care, we have created designated Provider Care Teams.  These Care Teams include your primary Cardiologist (physician) and Advanced Practice Providers (APPs -  Physician Assistants and Nurse Practitioners) who all work together to provide you with the care you need, when you need it. ?  ? ?Your next appointment:   ?6 month(s) ? ?The format for your next appointment:   ?In Person ? ?Provider:   ?Riley Lam, MD or Ronie Spies, PA-C, Jacolyn Reedy, PA-C, or Eligha Bridegroom, NP      ? ?Important Information About Sugar ? ? ? ? ?  ?

## 2022-01-02 LAB — COMPREHENSIVE METABOLIC PANEL
ALT: 32 IU/L (ref 0–44)
AST: 28 IU/L (ref 0–40)
Albumin/Globulin Ratio: 1.5 (ref 1.2–2.2)
Albumin: 3.8 g/dL (ref 3.8–4.8)
Alkaline Phosphatase: 143 IU/L — ABNORMAL HIGH (ref 44–121)
BUN/Creatinine Ratio: 19 (ref 10–24)
BUN: 21 mg/dL (ref 8–27)
Bilirubin Total: 0.6 mg/dL (ref 0.0–1.2)
CO2: 25 mmol/L (ref 20–29)
Calcium: 9.2 mg/dL (ref 8.6–10.2)
Chloride: 104 mmol/L (ref 96–106)
Creatinine, Ser: 1.09 mg/dL (ref 0.76–1.27)
Globulin, Total: 2.5 g/dL (ref 1.5–4.5)
Glucose: 85 mg/dL (ref 70–99)
Potassium: 5.1 mmol/L (ref 3.5–5.2)
Sodium: 141 mmol/L (ref 134–144)
Total Protein: 6.3 g/dL (ref 6.0–8.5)
eGFR: 73 mL/min/{1.73_m2} (ref >59)

## 2022-01-02 LAB — LIPID PANEL
Chol/HDL Ratio: 2.5 ratio (ref 0.0–5.0)
Cholesterol, Total: 104 mg/dL (ref 100–199)
HDL: 41 mg/dL (ref >39)
LDL Chol Calc (NIH): 52 mg/dL (ref 0–99)
Triglycerides: 44 mg/dL (ref 0–149)
VLDL Cholesterol Cal: 11 mg/dL (ref 5–40)

## 2022-01-02 LAB — PRO B NATRIURETIC PEPTIDE: NT-Pro BNP: 526 pg/mL — ABNORMAL HIGH (ref 0–376)

## 2022-01-10 ENCOUNTER — Telehealth: Payer: Self-pay

## 2022-01-10 DIAGNOSIS — I5089 Other heart failure: Secondary | ICD-10-CM

## 2022-01-10 DIAGNOSIS — I2510 Atherosclerotic heart disease of native coronary artery without angina pectoris: Secondary | ICD-10-CM

## 2022-01-10 MED ORDER — FUROSEMIDE 20 MG PO TABS: 20.0000 mg | ORAL_TABLET | Freq: Every day | ORAL | 3 refills | Status: DC

## 2022-01-10 NOTE — Telephone Encounter (Signed)
-----   Message from Christell Constant, MD sent at 01/05/2022  4:04 PM EDT ----- ?Results: ?BNP slightly elevated ?Plan: ?Lasix 20 mg PO daily ?CMP in two weeks ? ?Christell Constant, MD ? ?

## 2022-01-10 NOTE — Telephone Encounter (Signed)
The patient has been notified of the result and verbalized understanding.  All questions (if any) were answered. ?Macie Burows, RN 01/10/2022 4:00 PM   ?Pt will have f/u labs drawn on 01/22/22 at [facility] location either before or after [facility].  ?

## 2022-01-22 ENCOUNTER — Ambulatory Visit (HOSPITAL_COMMUNITY)
Admission: RE | Admit: 2022-01-22 | Discharge: 2022-01-22 | Disposition: A | Discharge status: Home / Self Care | Payer: Medicare HMO | Source: Ambulatory Visit | Attending: Cardiovascular Disease | Admitting: Cardiovascular Disease

## 2022-01-22 DIAGNOSIS — I739 Peripheral vascular disease, unspecified: Secondary | ICD-10-CM | POA: Diagnosis present

## 2022-01-22 DIAGNOSIS — I2510 Atherosclerotic heart disease of native coronary artery without angina pectoris: Secondary | ICD-10-CM | POA: Insufficient documentation

## 2022-02-11 ENCOUNTER — Other Ambulatory Visit (HOSPITAL_COMMUNITY): Payer: Self-pay | Admitting: Nurse Practitioner

## 2022-02-11 NOTE — Telephone Encounter (Signed)
Rx prescribed by Donna. Please address 

## 2022-04-04 ENCOUNTER — Telehealth: Payer: Self-pay | Admitting: Internal Medicine

## 2022-04-04 IMAGING — MR MR LUMBAR SPINE W/O CM
4 of 5 series · 27 of 48 positions shown · non-contrast
Comparison: Lumbar spine radiographs 10/19/2017

CLINICAL DATA: Low back pain extending into the left lower
extremity for 4 months.

EXAM:
MRI LUMBAR SPINE WITHOUT CONTRAST
TECHNIQUE: Multiplanar, multisequence MR imaging of the lumbar spine was
performed. No intravenous contrast was administered.

[Series 2: T2 · sagittal · 4.0mm · 1.09mm/px · 6 of 17 slices shown (1 of 2)]
[im 1/17]
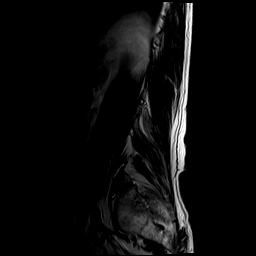
[im 4/17]
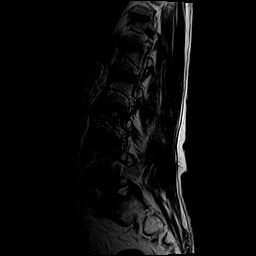
[im 7/17]
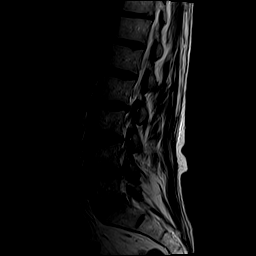
[im 10/17]
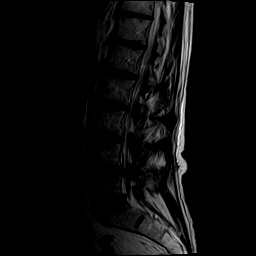
[im 13/17]
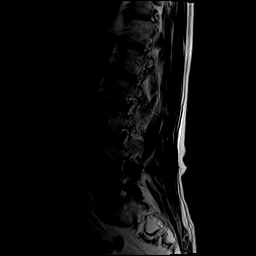
[im 17/17]
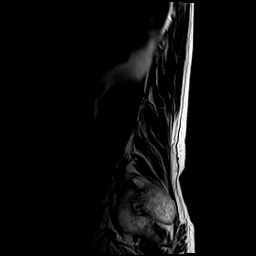

[Series 4: T1 · sagittal · 4.0mm · 1.09mm/px · 6 of 17 slices shown (1 of 2)]
[im 1/17]
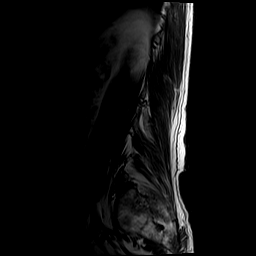
[im 4/17]
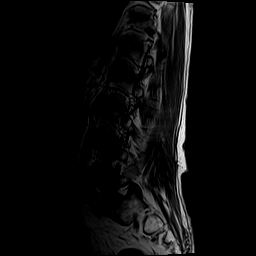
[im 7/17]
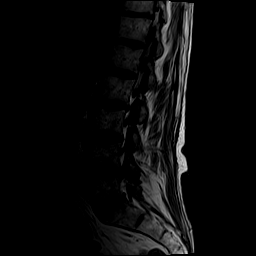
[im 10/17]
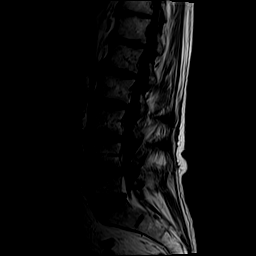
[im 13/17]
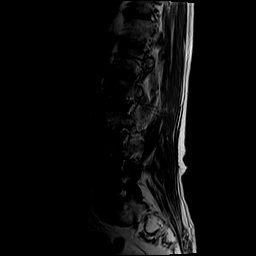
[im 17/17]
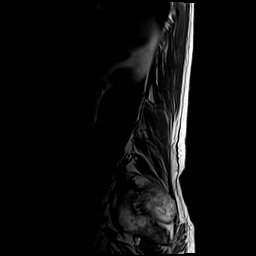

[Series 5: T2 · axial · 4.0mm · 0.39mm/px · z∈[-41,+186]mm · 9 of 45 slices shown (2 of 2)]
[im 1/45]
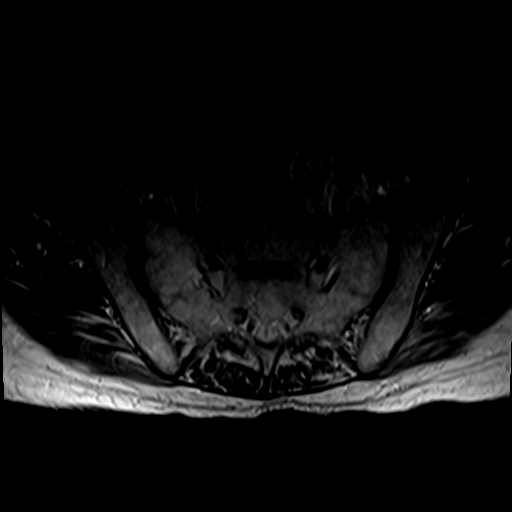
[im 7/45]
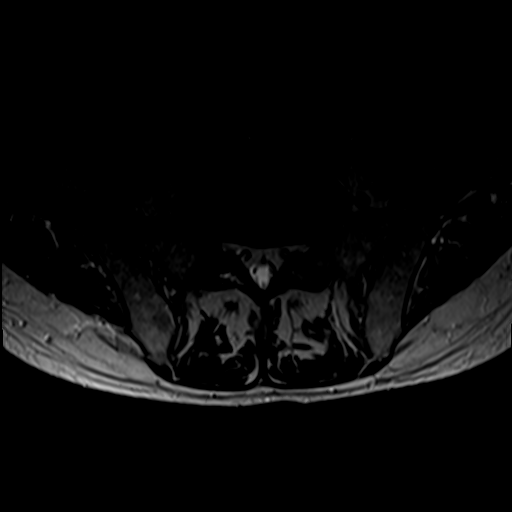
[im 13/45]
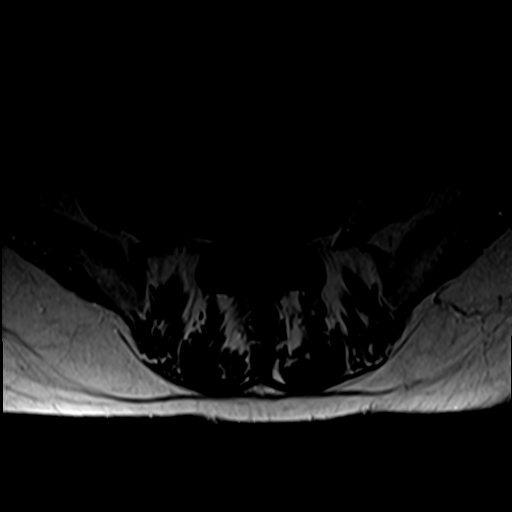
[im 19/45]
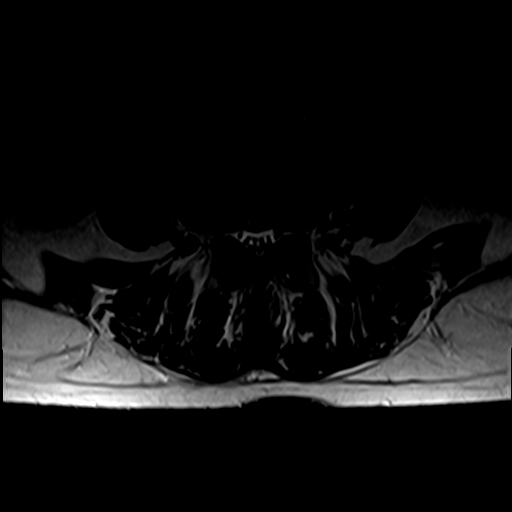
[im 23/45]
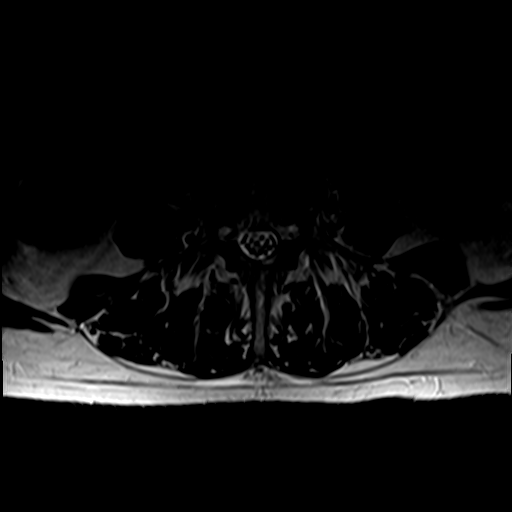
[im 26/45]
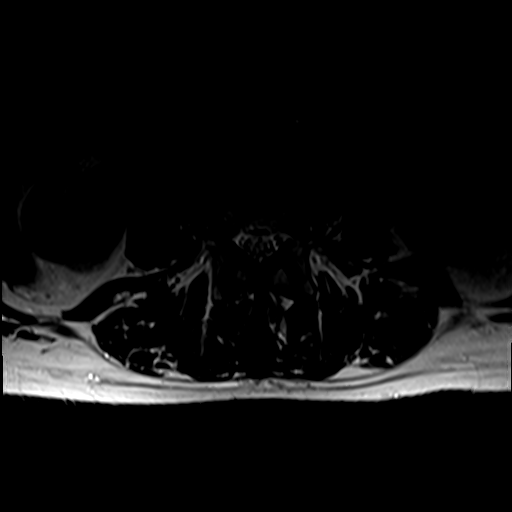
[im 32/45]
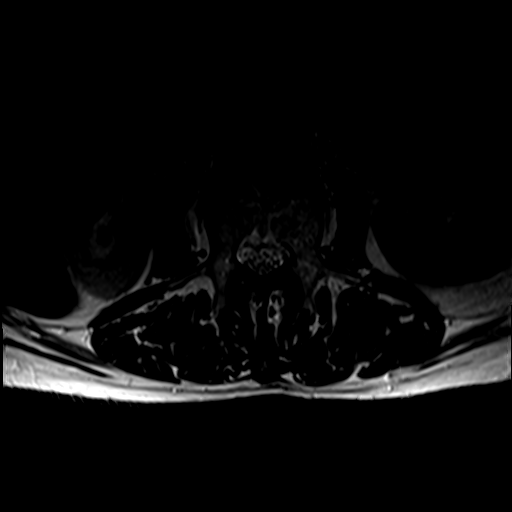
[im 38/45]
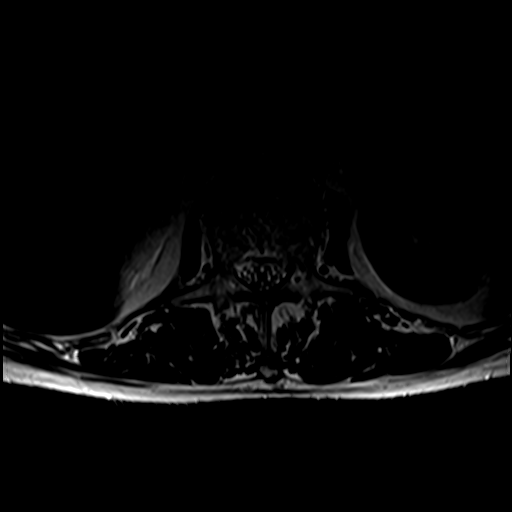
[im 45/45]
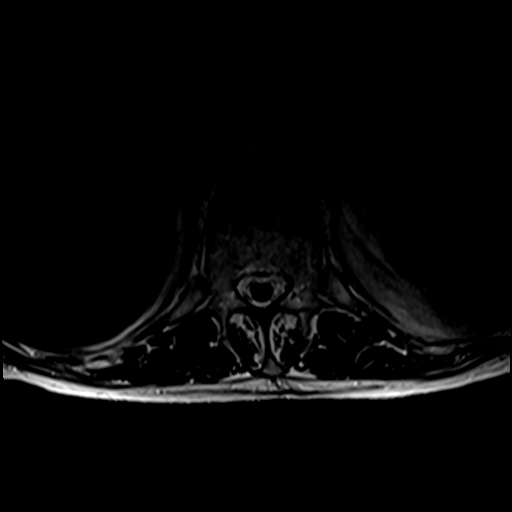

[Series 6: T1 · axial · 4.0mm · 0.39mm/px · z∈[-41,+153]mm · 6 of 45 slices shown (2 of 2)]
[im 1/45]
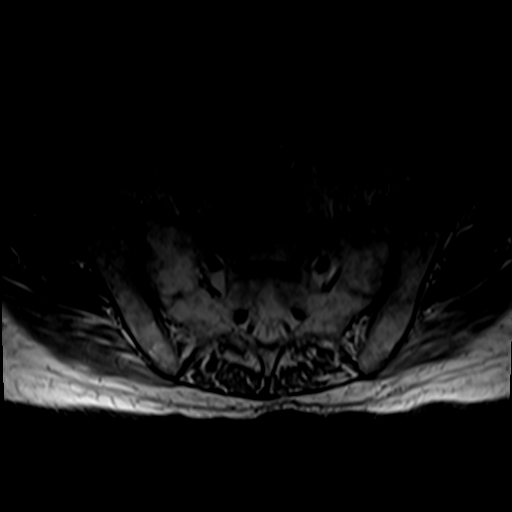
[im 7/45]
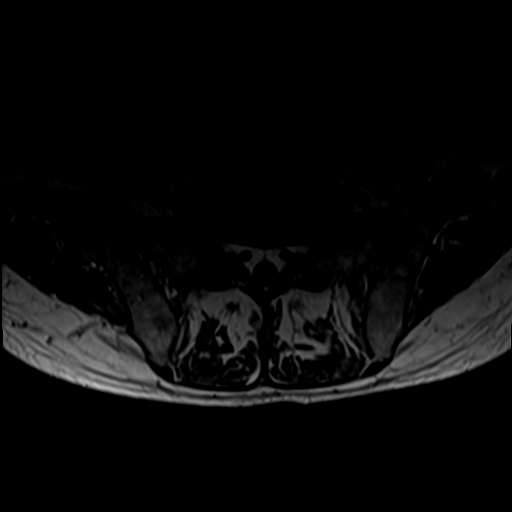
[im 13/45]
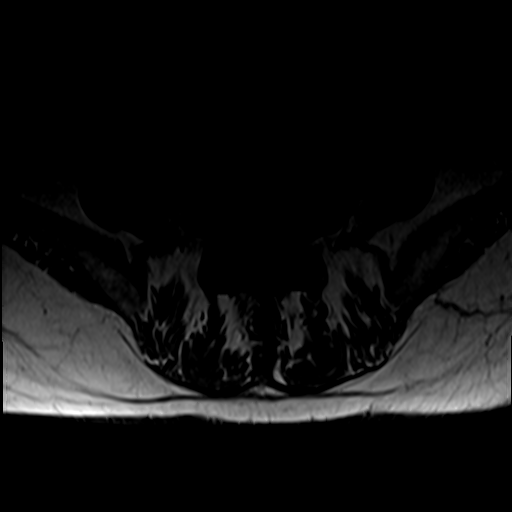
[im 19/45]
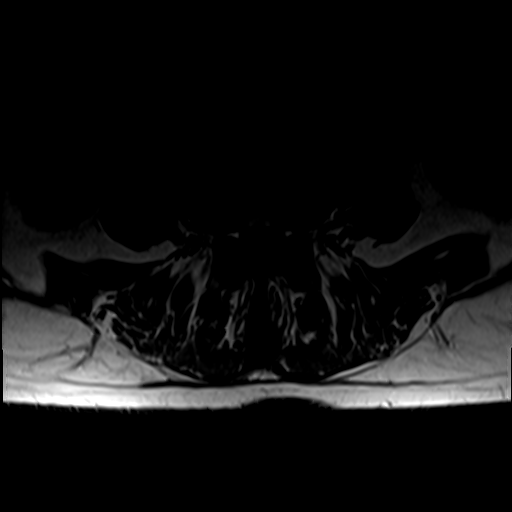
[im 23/45]
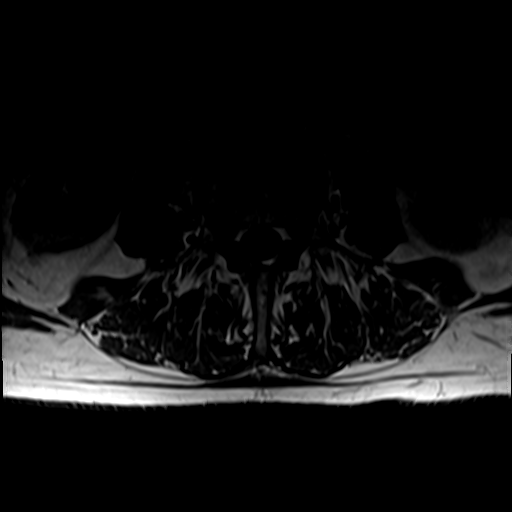
[im 38/45]
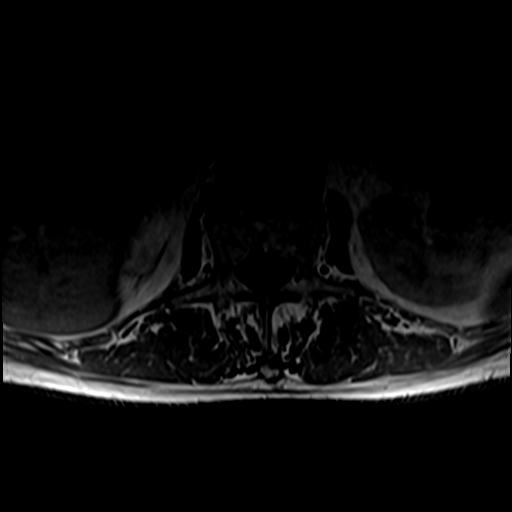

[27 of 48 positions shown; findings below may reference images not displayed]

FINDINGS: Segmentation: 5 non rib-bearing lumbar type vertebral bodies are
present. The lowest fully formed vertebral body is L5.

Alignment: Slight retrolisthesis at L2-3 is stable. No other
significant listhesis is present.

Vertebrae:  Marrow signal and vertebral body heights are normal.

Conus medullaris and cauda equina: Conus extends to the L1 level.
Conus and cauda equina appear normal.

Paraspinal and other soft tissues: Limited imaging the abdomen is
unremarkable. There is no significant adenopathy. No solid organ
lesions are present.

Disc levels:

T12-L1: Mild facet hypertrophy is present bilaterally. No
significant disc protrusion or stenosis is present.

L1-2: Mild facet hypertrophy is present. No significant disc
protrusion or stenosis is present.

L2-3: A mild broad-based disc protrusion is present. Short pedicles
and facet hypertrophy contribute to mild foraminal narrowing
bilaterally, left greater than right.

L3-4: Short pedicles noted. No focal disc protrusion or stenosis
present.

L4-5: A left paramedian disc protrusion is present. Short pedicles
and moderate facet hypertrophy contribute 2 moderate central canal
stenosis with left greater than right subarticular narrowing.
Moderate foraminal narrowing is also present, left greater than
right.

L5-S1: A shallow central disc protrusion is present. No significant
stenosis is present.
IMPRESSION: 1. Left paramedian disc protrusion with moderate central canal
stenosis and left greater than right subarticular narrowing at L4-5.
This is the most significant left-sided disease.
2. Moderate foraminal narrowing bilaterally at L4-5 is also worse on
the left.
3. Mild foraminal narrowing bilaterally at L2-3 is worse on the
left.
4. Shallow central disc protrusion at L5-S1 without significant
stenosis.
5. In addition to disc disease and facet hypertrophy, congenital
stenosis is present with short pedicles.

## 2022-04-04 NOTE — Telephone Encounter (Signed)
Left a message to call back.

## 2022-04-04 NOTE — Telephone Encounter (Signed)
Called Peter Mclean back in regards to furosemide and spironolactone potentially causing gout.  Peter Mclean reports went to [facility] 04/03/22 for hand and feet swelling.  Peter Mclean expresses CT scan performed at [facility].  Reports has had numbness and tingling in toes since had back surgery.  Larey Seat out of tub about 1.5 months ago.  Has seen orthopedic since fall for hand/wrist swelling.  Peter Mclean denies SOB, has swelling to bilateral hands right worse than left and mild swelling to BLE.  Takes furosemide and spironolactone as ordered.  Does not have a recent BP reading.   Notes for Urgent Care visit are in Care everywhere.  Will route message to MD to review.

## 2022-04-04 NOTE — Telephone Encounter (Signed)
Pt c/o medication issue:  1. Name of Medication:   furosemide (LASIX) 20 MG tablet    spironolactone (ALDACTONE) 25 MG tablet    2. How are you currently taking this medication (dosage and times per day)?   3. Are you having a reaction (difficulty breathing--STAT)? NO  4. What is your medication issue? Daughter states that pt went to Urgent Care and the provider there stated that above medications may be causing pt to have Gout. Daughter would like a callback regarding this matter . Please advise

## 2022-04-08 NOTE — Progress Notes (Unsigned)
Primary Care Provider:  Christain Sacramento, MD Primary Cardiologist:  Thompson Grayer, MD Primary Electrophysiologist: None  Chief Complaint    Peter Mclean is a 71 y.o. male with PMH of CAD s/p NSTEMI treated with DES to mLAD 11/2020, NICM, chronic systolic CHF, HTN, HLD persistent AF s/p PVI in 2015, tobacco abuse, anxiety who presents today for complaint of symptoms with Lasix and spironolactone.  Past Medical History    Past Medical History:  Diagnosis Date   Anxiety    Arthritis    Chronic systolic CHF (congestive heart failure) ([place])    a. Dx 09/2013 in setting of AF-RVR; EF 10-15%, possibly tachy-mediated. Cath 09/2013: normal cors. b. TEE: EF 20-25% by echo 10/2013.   Coronary artery disease    Dyslipidemia (high LDL; low HDL)    Elevated liver enzymes    H/O hematuria    History of inguinal hernia    Hypertension    NICM (nonischemic cardiomyopathy) (HCC)    Non-compliance    a. Adm 09/2013 after having stopped his meds.   NSTEMI (non-ST elevated myocardial infarction) ([place]) 11/27/2020   LAD stent   Paroxysmal atrial fibrillation (HCC)    Paroxysmal atrial flutter (HCC)    typical appearing   Pseudoaneurysm following procedure ([place])    a. RLE pseudoaneurysm after cath, spontaneously decreased in size, did not require compression.   Sinus bradycardia    a. HR 40's in ER 11/2013 - digoxin stopped, Coreg decreased.   Tobacco abuse    Past Surgical History:  Procedure Laterality Date   ABLATION   06/13/2014    PVI and CTI by Dr Rayann Heman   APPENDECTOMY   1970   ATRIAL FIBRILLATION ABLATION N/A 06/13/2014    Procedure: ATRIAL FIBRILLATION ABLATION;  Surgeon: Coralyn Mark, MD;  Location: [place] CATH LAB;  Service: Cardiovascular;  Laterality: N/A;   CARDIOVERSION N/A 10/17/2013    Procedure: CARDIOVERSION;  Surgeon: Sanda Klein, MD;  Location: [place] ENDOSCOPY;  Service: Cardiovascular;  Laterality: N/A;   CARDIOVERSION N/A 12/13/2020    Procedure: CARDIOVERSION;  Surgeon: Fay Records, MD;  Location: [facility] ENDOSCOPY;  Service: Cardiovascular;  Laterality: N/A;   CHOLECYSTECTOMY       CORONARY STENT INTERVENTION N/A 11/29/2020    Procedure: CORONARY STENT INTERVENTION;  Surgeon: Lorretta Harp, MD;  Location: [place] CV LAB;  Service: Cardiovascular;  Laterality: N/A;   HERNIA REPAIR   1992 & 2011   INTRAVASCULAR IMAGING/OCT N/A 11/29/2020    Procedure: INTRAVASCULAR IMAGING/OCT;  Surgeon: Lorretta Harp, MD;  Location: [place] CV LAB;  Service: Cardiovascular;  Laterality: N/A;   LEFT AND RIGHT HEART CATHETERIZATION WITH CORONARY ANGIOGRAM N/A 10/13/2013    Procedure: LEFT AND RIGHT HEART CATHETERIZATION WITH CORONARY ANGIOGRAM;  Surgeon: Leonie Man, MD;  Location: [facility] CATH LAB;  Service: Cardiovascular;  Laterality: N/A;   LEFT HEART CATH AND CORONARY ANGIOGRAPHY N/A 11/29/2020    Procedure: LEFT HEART CATH AND CORONARY ANGIOGRAPHY;  Surgeon: Lorretta Harp, MD;  Location: [place] CV LAB;  Service: Cardiovascular;  Laterality: N/A;   LUMBAR LAMINECTOMY/ DECOMPRESSION WITH MET-RX N/A 10/03/2021    Procedure: Lumbar four-five Minimally Invasive  Decompression with Metrx;  Surgeon: Judith Part, MD;  Location: [place];  Service: Neurosurgery;  Laterality: N/A;   PSEUDOANERYSM COMPRESSION   11/11/2013    Procedure: PSEUDOANERYSM COMPRESSION;  Surgeon: Fay Records, MD;  Location: [facility] CATH LAB;  Service: Cardiovascular;;   TEE WITHOUT CARDIOVERSION N/A 10/17/2013  Procedure: TRANSESOPHAGEAL ECHOCARDIOGRAM (TEE);  Surgeon: Sanda Klein, MD;  Location: [facility] ENDOSCOPY;  Service: Cardiovascular;  Laterality: N/A;   TEE WITHOUT CARDIOVERSION N/A 06/12/2014    Procedure: TRANSESOPHAGEAL ECHOCARDIOGRAM (TEE);  Surgeon: Fay Records, MD;  Location: [facility] ENDOSCOPY;  Service: Cardiovascular;  Laterality: N/A;      Current Medications: Active Medications      Current Meds  Medication Sig   albuterol (VENTOLIN HFA) 108 (90 Base) MCG/ACT inhaler 1-2  puffs every 6 (six) hours as needed for wheezing.   ALPRAZolam (XANAX) 1 MG tablet Take 1 mg by mouth at bedtime.   amiodarone (PACERONE) 200 MG tablet Take 1 tablet (200 mg total) by mouth daily.   apixaban (ELIQUIS) 5 MG TABS tablet Take 1 tablet (5 mg total) by mouth 2 (two) times daily. Restart on 10/06/21   atorvastatin (LIPITOR) 80 MG tablet Take 1 tablet (80 mg total) by mouth daily.   gabapentin (NEURONTIN) 300 MG capsule Take 300 mg by mouth 3 (three) times daily.   HYDROcodone-acetaminophen (NORCO/VICODIN) 5-325 MG tablet Take 1 tablet by mouth every 4 (four) hours as needed for pain.   metoprolol succinate (TOPROL-XL) 100 MG 24 hr tablet Take 0.5 tablets (50 mg total) by mouth daily. Take with or immediately following a meal.   nitroGLYCERIN (NITROSTAT) 0.4 MG SL tablet PLACE 1 TABLET UNDER THE TONGUE EVERY 5 MINUTES AS NEEDED FOR CHEST PAIN.   spironolactone (ALDACTONE) 25 MG tablet Take 0.5 tablets (12.5 mg total) by mouth daily.   tamsulosin (FLOMAX) 0.4 MG CAPS capsule Take 0.4 mg by mouth daily after supper.   [DISCONTINUED] aspirin EC 81 MG tablet Take 81 mg by mouth daily. Swallow whole.        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   ROS:   Please see the history of present illness.     All  other systems reviewed and are negative.   EKGs/Labs/Other Studies Reviewed:     The following studies were reviewed today:   Recent Labs: 09/27/2021: ALT 32; BUN 21; Creatinine, Ser 1.19; Potassium 4.9; Sodium 139; TSH 2.500 10/01/2021: Hemoglobin 13.9; Platelets 207  Recent Lipid Panel Labs (Brief)          Component Value Date/Time    CHOL 152 11/28/2020 0107    TRIG 64 11/28/2020 0107    HDL 36 (L) 11/28/2020 0107    CHOLHDL 4.2 11/28/2020 0107    VLDL 13 11/28/2020 0107    LDLCALC 103 (H) 11/28/2020 0107            Physical Exam:     Today's Vitals   04/09/22 1017  BP: 102/60  Pulse: (!) 57  SpO2: 97%  Weight: 132 lb (59.9 kg)  Height: '5\' 6"'  (1.676 m)   Body mass index is 21.31 kg/m.    Physical Exam: GEN:  Well nourished, well developed in no acute distress HEENT: Normal NECK: No JVD; LYMPHATICS: No lymphadenopathy CARDIAC: RRR, no murmurs, rubs, gallops RESPIRATORY:  Clear to auscultation without rales, wheezing or rhonchi  ABDOMEN: Soft, non-tender, non-distended MUSCULOSKELETAL:  Non pitting edema; No deformity  SKIN: Warm and dry NEUROLOGIC:  Alert and oriented x 3 PSYCHIATRIC:  Normal affect   Assessment and Plan:  1.  Coronary artery disease: -s/p NSTEMI treated with DES to mLAD 11/2020 -Patient reports no anginal symptoms today or shortness of breath -Continue GDMT with Lipitor  80 mg daily and Toprol 50 mg daily, patient not on aspirin therapy due to Eliquis for atrial fibrillation  2. HFimEF/NICM: -Last 2D echo was completed on 02/2021 with EF of 60-65% improved from 30-35% and March 2022 -Patient is euvolemic on examination today and weight is down 9 pounds since last appointment -Reduce Lasix to 20 mg every other day to rule out possible gout flares -Continue current GDMT with spironolactone 12.5 mg daily and Toprol 50 mg daily -Patient advised to follow-up with PCP regarding treatment for gout flares Low sodium diet, fluid restriction <2L, and daily weights encouraged. Educated to contact our office for weight gain of 2 lbs overnight or 5 lbs in one week.   3.  Persistent atrial fibrillation: -Patient reports no bleeding with CHA2DS2-VASc of 5 on Eliquis 5 mg twice daily -PVI isolation in 2015 by Dr. Rayann Heman with no recurrence of AF -Continue amiodarone 200 mg daily and Toprol 50 mg daily  4.  HTN: -Patient's blood pressure today was 102/60 -Continue antihypertensive regimen as noted above  -5.  HLD: -Most recent LDL was 52 with low goal of 70 on 12/2021 -Continue Lipitor 80 mg daily  Disposition: Follow-up with Dr. Gasper Sells as scheduled    Medication Adjustments/Labs and Tests Ordered: Current medicines are reviewed at length with the patient today.  Concerns regarding medicines are outlined above.   Signed, Mable Fill, Marissa Nestle, NP 04/09/2022, [place]

## 2022-04-09 ENCOUNTER — Encounter: Payer: Self-pay | Admitting: Nurse Practitioner

## 2022-04-09 ENCOUNTER — Ambulatory Visit: Payer: Medicare HMO | Admitting: Nurse Practitioner

## 2022-04-09 VITALS — BP 102/60 | HR 57 | Ht 66.0 in | Wt 132.0 lb

## 2022-04-09 DIAGNOSIS — I10 Essential (primary) hypertension: Secondary | ICD-10-CM | POA: Diagnosis not present

## 2022-04-09 DIAGNOSIS — I4819 Other persistent atrial fibrillation: Secondary | ICD-10-CM

## 2022-04-09 DIAGNOSIS — I428 Other cardiomyopathies: Secondary | ICD-10-CM

## 2022-04-09 DIAGNOSIS — E785 Hyperlipidemia, unspecified: Secondary | ICD-10-CM

## 2022-04-09 DIAGNOSIS — I2510 Atherosclerotic heart disease of native coronary artery without angina pectoris: Secondary | ICD-10-CM

## 2022-04-09 MED ORDER — FUROSEMIDE 20 MG PO TABS: 20.0000 mg | ORAL_TABLET | ORAL | 3 refills | Status: DC

## 2022-04-09 NOTE — Patient Instructions (Addendum)
Medication Instructions:  Your physician has recommended you make the following change in your medication:   REDUCE the Lasix to every other day  *If you need a refill on your cardiac medications before your next appointment, please call your pharmacy*   Lab Work: None ordered  If you have labs (blood work) drawn today and your tests are completely normal, you will receive your results only by: MyChart Message (if you have MyChart) OR A paper copy in the mail If you have any lab test that is abnormal or we need to change your treatment, we will call you to review the results.   Testing/Procedures: None ordered   Follow-Up: At [facility], you and your health needs are our priority.  As part of our continuing mission to provide you with exceptional heart care, we have created designated Provider Care Teams.  These Care Teams include your primary Cardiologist (physician) and Advanced Practice Providers (APPs -  Physician Assistants and Nurse Practitioners) who all work together to provide you with the care you need, when you need it.  We recommend signing up for the patient portal called "MyChart".  Sign up information is provided on this After Visit Summary.  MyChart is used to connect with patients for Virtual Visits (Telemedicine).  Patients are able to view lab/test results, encounter notes, upcoming appointments, etc.  Non-urgent messages can be sent to your provider as well.   To learn more about what you can do with MyChart, go to [email or web address].    Your next appointment:   3 month(s)  The format for your next appointment:   In Person  Provider:     Weigh yourself daily  Watch your salt intake   Other Instructions Gout  Gout is painful swelling of your joints. Gout is a type of arthritis. It is caused by having too much uric acid in your body. Uric acid is a chemical that is made when your body breaks down substances called purines. If your body has too much  uric acid, sharp crystals can form and build up in your joints. This causes pain and swelling. Gout attacks can happen quickly and be very painful (acute gout). Over time, the attacks can affect more joints and happen more often (chronic gout). What are the causes? Gout is caused by too much uric acid in your blood. This can happen because: Your kidneys do not remove enough uric acid from your blood. Your body makes too much uric acid. You eat too many foods that are high in purines. These foods include organ meats, some seafood, and beer. Trauma or stress can bring on an attack. What increases the risk? Having a family history of gout. Being male and middle-aged. Being male and having gone through menopause. Having an organ transplant. Taking certain medicines. Having certain conditions, such as: Being very overweight (obese). Lead poisoning. Kidney disease. A skin condition called psoriasis. Other risks include: Losing weight too quickly. Not having enough water in the body (being dehydrated). Drinking alcohol, especially beer. Drinking beverages that are sweetened with a type of sugar called fructose. What are the signs or symptoms? An attack of acute gout often starts at night and usually happens in just one joint. The most common place is the big toe. Other joints that may be affected include joints of the feet, ankle, knee, fingers, wrist, or elbow. Symptoms may include: Very bad pain. Warmth. Swelling. Stiffness. Tenderness. The affected joint may be very painful to touch. Shiny, red, or purple  skin. Chills and fever. Chronic gout may cause symptoms more often. More joints may be involved. You may also have white or yellow lumps (tophi) on your hands or feet or in other areas near your joints. How is this treated? Treatment for an acute attack may include medicines for pain and swelling, such as: NSAIDs, such as ibuprofen. Steroids taken by mouth or injected into a  joint. Colchicine. This can be given by mouth or through an IV tube. Treatment to prevent future attacks may include: Taking small doses of NSAIDs or colchicine daily. Using a medicine that reduces uric acid levels in your blood, such as allopurinol. Making changes to your diet. You may need to see a food expert (dietitian) about what to eat and drink to prevent gout. Follow these instructions at home: During a gout attack  If told, put ice on the painful area. To do this: Put ice in a plastic bag. Place a towel between your skin and the bag. Leave the ice on for 20 minutes, 2-3 times a day. Take off the ice if your skin turns bright red. This is very important. If you cannot feel pain, heat, or cold, you have a greater risk of damage to the area. Raise the painful joint above the level of your heart as often as you can. Rest the joint as much as possible. If the joint is in your leg, you may be given crutches. Follow instructions from your doctor about what you cannot eat or drink. Avoiding future gout attacks Eat a low-purine diet. Avoid foods and drinks such as: Liver. Kidney. Anchovies. Asparagus. Herring. Mushrooms. Mussels. Beer. Stay at a healthy weight. If you want to lose weight, talk with your doctor. Do not lose weight too fast. Start or continue an exercise plan as told by your doctor. Eating and drinking Avoid drinks sweetened by fructose. Drink enough fluids to keep your pee (urine) pale yellow. If you drink alcohol: Limit how much you have to: 0-1 drink a day for women who are not pregnant. 0-2 drinks a day for men. Know how much alcohol is in a drink. In the [place] one drink equals one 12 oz bottle of beer (355 mL), one 5 oz glass of wine (148 mL), or one 1 oz glass of hard liquor (44 mL). General instructions Take over-the-counter and prescription medicines only as told by your doctor. Ask your doctor if you should avoid driving or using machines while you are  taking your medicine. Return to your normal activities when your doctor says that it is safe. Keep all follow-up visits. Where to find more information [employer]: www.niams.http://www.myers.net/ Contact a doctor if: You have another gout attack. You still have symptoms of a gout attack after 10 days of treatment. You have problems (side effects) because of your medicines. You have chills or a fever. You have burning pain when you pee (urinate). You have pain in your lower back or belly. Get help right away if: You have very bad pain. Your pain cannot be controlled. You cannot pee. Summary Gout is painful swelling of the joints. The most common site of pain is the big toe, but it can affect other joints. Medicines and avoiding some foods can help to prevent and treat gout attacks. This information is not intended to replace advice given to you by your health care provider. Make sure you discuss any questions you have with your health care provider. Document Revised: 06/05/2021 Document Reviewed: 06/05/2021 Elsevier Patient  Education  2023 Elsevier Inc.  

## 2022-04-09 NOTE — Telephone Encounter (Signed)
Pt had OV with Alden Server, NP today.  Issue was addressed.   "-Reduce Lasix to 20 mg every other day to rule out possible gout flares -Continue current GDMT with spironolactone 12.5 mg daily and Toprol 50 mg daily -Patient advised to follow-up with PCP regarding treatment for gout flares Low sodium diet, fluid restriction <2L, and daily weights encouraged. Educated to contact our office for weight gain of 2 lbs overnight or 5 lbs in one week. "

## 2022-06-24 NOTE — Progress Notes (Deleted)
Cardiology Office Note:    Date:  06/24/2022   ID:  Peter Mclean, DOB February 14, 1951, MRN [id number]  PCP:  Christain Sacramento, MD   [facility] Providers Cardiologist:  Thompson Grayer, MD Electrophysiology APP:  Sherran Needs, NP     Referring MD: Christain Sacramento, MD   CC:  CAD f/u  History of Present Illness:    Peter Mclean is a 71 y.o. male with a hx of CAD with pLAD NSTEMI 11/2021, HFrEF EF (10%-> 30-35% at NSTEMI-> normal 6/22) persistent AF s/p PVI ablation in 2015, former smoking, and possible remote stroke.  Plavix allergy Rash able to tolerate Brilinta but with some skin peeling.  Amiodarone liver concern in the past but tolerated well 2022. 2023: LVEF improved on GDMT.  At APP visit with reduced diuretic dose.  Patient notes that he is doing ***.   Since last visit notes *** . There are no*** interval hospital/ED visit.    No chest pain or pressure ***.  No SOB/DOE*** and no PND/Orthopnea***.  No weight gain or leg swelling***.  No palpitations or syncope ***.  Ambulatory blood pressure ***.    Past Medical History:  Diagnosis Date   Anxiety    Arthritis    Chronic systolic CHF (congestive heart failure) ([place])    a. Dx 09/2013 in setting of AF-RVR; EF 10-15%, possibly tachy-mediated. Cath 09/2013: normal cors. b. TEE: EF 20-25% by echo 10/2013.   Coronary artery disease    Dyslipidemia (high LDL; low HDL)    Elevated liver enzymes    H/O hematuria    History of inguinal hernia    Hypertension    NICM (nonischemic cardiomyopathy) (HCC)    Non-compliance    a. Adm 09/2013 after having stopped his meds.   NSTEMI (non-ST elevated myocardial infarction) ([place]) 11/27/2020   LAD stent   Paroxysmal atrial fibrillation (HCC)    Paroxysmal atrial flutter (HCC)    typical appearing   Pseudoaneurysm following procedure ([place])    a. RLE pseudoaneurysm after cath, spontaneously decreased in size, did not require compression.   Sinus bradycardia    a. HR 40's in ER 11/2013 -  digoxin stopped, Coreg decreased.   Tobacco abuse     Past Surgical History:  Procedure Laterality Date   ABLATION  06/13/2014   PVI and CTI by Dr Rayann Heman   APPENDECTOMY  1970   ATRIAL FIBRILLATION ABLATION N/A 06/13/2014   Procedure: ATRIAL FIBRILLATION ABLATION;  Surgeon: Coralyn Mark, MD;  Location: [facility] CATH LAB;  Service: Cardiovascular;  Laterality: N/A;   CARDIOVERSION N/A 10/17/2013   Procedure: CARDIOVERSION;  Surgeon: Sanda Klein, MD;  Location: [facility] ENDOSCOPY;  Service: Cardiovascular;  Laterality: N/A;   CARDIOVERSION N/A 12/13/2020   Procedure: CARDIOVERSION;  Surgeon: Fay Records, MD;  Location: [facility] ENDOSCOPY;  Service: Cardiovascular;  Laterality: N/A;   CHOLECYSTECTOMY     CORONARY STENT INTERVENTION N/A 11/29/2020   Procedure: CORONARY STENT INTERVENTION;  Surgeon: Lorretta Harp, MD;  Location: [place] CV LAB;  Service: Cardiovascular;  Laterality: N/A;   HERNIA REPAIR  1992 & 2011   INTRAVASCULAR IMAGING/OCT N/A 11/29/2020   Procedure: INTRAVASCULAR IMAGING/OCT;  Surgeon: Lorretta Harp, MD;  Location: [place] CV LAB;  Service: Cardiovascular;  Laterality: N/A;   LEFT AND RIGHT HEART CATHETERIZATION WITH CORONARY ANGIOGRAM N/A 10/13/2013   Procedure: LEFT AND RIGHT HEART CATHETERIZATION WITH CORONARY ANGIOGRAM;  Surgeon: Leonie Man, MD;  Location: [facility] CATH LAB;  Service: Cardiovascular;  Laterality: N/A;   LEFT HEART CATH AND CORONARY ANGIOGRAPHY N/A 11/29/2020   Procedure: LEFT HEART CATH AND CORONARY ANGIOGRAPHY;  Surgeon: Lorretta Harp, MD;  Location: [place] CV LAB;  Service: Cardiovascular;  Laterality: N/A;   LUMBAR LAMINECTOMY/ DECOMPRESSION WITH MET-RX N/A 10/03/2021   Procedure: Lumbar four-five Minimally Invasive  Decompression with Metrx;  Surgeon: Judith Part, MD;  Location: [place];  Service: Neurosurgery;  Laterality: N/A;   PSEUDOANERYSM COMPRESSION  11/11/2013   Procedure: PSEUDOANERYSM COMPRESSION;  Surgeon: Fay Records, MD;  Location: [facility] CATH LAB;  Service: Cardiovascular;;   TEE WITHOUT CARDIOVERSION N/A 10/17/2013   Procedure: TRANSESOPHAGEAL ECHOCARDIOGRAM (TEE);  Surgeon: Sanda Klein, MD;  Location: [facility] ENDOSCOPY;  Service: Cardiovascular;  Laterality: N/A;   TEE WITHOUT CARDIOVERSION N/A 06/12/2014   Procedure: TRANSESOPHAGEAL ECHOCARDIOGRAM (TEE);  Surgeon: Fay Records, MD;  Location: [facility] ENDOSCOPY;  Service: Cardiovascular;  Laterality: N/A;    Current Medications: No outpatient medications have been marked as taking for the 06/25/22 encounter (Appointment) with Werner Lean, MD.     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  ROS:   Please see the  history of present illness.     All other systems reviewed and are negative.  EKGs/Labs/Other Studies Reviewed:    The following studies were reviewed today:  LEFT HEART CATH AND CORONARY ANGIOGRAPHY, LEFT HEART CATH AND CORONARY ANGIOGRAPHY, LEFT HEART CATH AND CORONARY ANGIOGRAPHY 11/29/2020  Narrative Images from the original result were not included.   Mid LAD lesion is 99% stenosed.  Prox LAD to Mid LAD lesion is 50% stenosed.  Post intervention, there is a 0% residual stenosis.  Post intervention, there is a 0% residual stenosis.  A drug-eluting stent was successfully placed using a STENT RESOLUTE ONYX 3.0X30.  There is mild to moderate left ventricular systolic dysfunction.  The left ventricular ejection fraction is 45-50% by visual estimate.  LV end diastolic pressure is normal.  Koleman Marling is a 71 y.o. male   GATED  SPECT MYO PERF W/LEXISCAN STRESS 1D 09/27/2021  Narrative   The study is normal. The study is low risk.   No ST deviation was noted.   LV perfusion is normal. There is no evidence of ischemia. There is no evidence of infarction.   Left ventricular function is normal. Nuclear stress EF: 64 %. The left ventricular ejection fraction is normal (55-65%). End diastolic cavity size is normal.   Prior study not available for comparison.  Reduced apical counts with normal wall motion consistent with apical thinning artifact. Normal study without ischemia or infarction. Normal LVEF, 64%. This is a low-risk study.   ECHO COMPLETE WO IMAGING ENHANCING AGENT 02/15/2021  Narrative ECHOCARDIOGRAM REPORT    Patient Name:   Peter Mclean Date of Exam: 02/15/2021 Medical Rec #:  [id number]      Height:       67.0 in Accession #:    [id number]     Weight:       133.7 lb Date of Birth:  1951-04-06       BSA:          1.704 m Patient Age:    6 years       BP:           128/86 mmHg Patient Gender: M              HR:           82 bpm. Exam Location:   [place]  Procedure: 2D Echo, Cardiac Doppler and Color Doppler  Indications:    T90.30 Systolic Dysfunction  History:        Patient has prior history of Echocardiogram examinations, most recent 11/29/2020. Risk Factors:Current Smoker, Hypertension and Dyslipidemia. Anxiety. Chronic systolic heart failure. Paroxysmal atrial fibrillation. Nonischemic cardiomyopathy.  Sonographer:    Diamond Nickel RCS Referring Phys: [id number]   1. Left ventricular ejection fraction, by estimation, is 60 to 65%. The left ventricle has normal function. The left ventricle has no regional wall motion abnormalities. Left ventricular diastolic function could not be evaluated. 2. Right ventricular systolic function is mildly reduced. The right ventricular size is normal. Tricuspid regurgitation signal is inadequate for assessing PA pressure. 3. The mitral valve is normal in structure. Trivial mitral valve regurgitation. No evidence of mitral stenosis. 4. The aortic valve is normal in structure. Aortic valve regurgitation is not visualized. No aortic stenosis is present.  FINDINGS Left Ventricle: Left ventricular ejection fraction, by estimation, is 60 to 65%. The left ventricle has normal function. The left ventricle has no regional wall motion abnormalities. The left ventricular internal cavity size was normal in size. There is no left ventricular hypertrophy. Left ventricular diastolic function could not be evaluated due to atrial fibrillation. Left ventricular diastolic function could not be evaluated.  Right Ventricle: The right ventricular size is normal. No increase in right ventricular wall thickness. Right ventricular systolic function is mildly reduced. Tricuspid regurgitation signal is inadequate for assessing PA pressure.  Left Atrium: Left atrial size was normal in size.  Right Atrium: Right atrial size was normal in size.  Pericardium: There is no evidence of  pericardial effusion.  Mitral Valve: The mitral valve is normal in structure. Trivial mitral valve regurgitation. No evidence of mitral valve stenosis.  Tricuspid Valve: The tricuspid valve is normal in structure. Tricuspid valve regurgitation is trivial. No evidence of tricuspid stenosis.  Aortic Valve: The aortic valve is normal in structure. Aortic valve regurgitation is not  visualized. No aortic stenosis is present.  Pulmonic Valve: The pulmonic valve was normal in structure. Pulmonic valve regurgitation is not visualized. No evidence of pulmonic stenosis.  Aorta: The aortic root is normal in size and structure.  Venous: The inferior vena cava was not well visualized.  IAS/Shunts: No atrial level shunt detected by color flow Doppler.   LEFT VENTRICLE PLAX 2D LVIDd:         3.90 cm LVIDs:         2.90 cm LV PW:         0.80 cm LV IVS:        0.90 cm LVOT diam:     1.95 cm LV SV:         42 LV SV Index:   25 LVOT Area:     2.99 cm   RIGHT VENTRICLE RV Basal diam:  2.40 cm TAPSE (M-mode): 1.5 cm  LEFT ATRIUM             Index       RIGHT ATRIUM           Index LA diam:        3.50 cm 2.05 cm/m  RA Area:     12.20 cm LA Vol (A2C):   36.7 ml 21.54 ml/m RA Volume:   27.80 ml  16.31 ml/m LA Vol (A4C):   28.9 ml 16.96 ml/m LA Biplane Vol: 33.1 ml 19.42 ml/m AORTIC VALVE LVOT Vmax:   76.13 cm/s LVOT Vmean:  45.533 cm/s LVOT VTI:    0.140 m  AORTA Ao Root diam: 3.20 cm   SHUNTS Systemic VTI:  0.14 m Systemic Diam: 1.95 cm   LONG TERM MONITOR (3-7 DAYS) INTERPRETATION 11/28/2020  Narrative Sinus rhythm with afib events observed Atrial Fibrillation occurred (62% burden), ranging from 69-187 bpm (avg of 108 bpm), the longest lasting 11 hours 34 mins with an avg rate of 104 bpm.     Recent Labs: 09/27/2021: TSH 2.500 10/01/2021: Hemoglobin 13.9; Platelets 207 01/01/2022: ALT 32; BUN 21; Creatinine, Ser 1.09; NT-Pro BNP 526; Potassium 5.1; Sodium 141  Recent  Lipid Panel    Component Value Date/Time   CHOL 104 01/01/2022 1008   TRIG 44 01/01/2022 1008   HDL 41 01/01/2022 1008   CHOLHDL 2.5 01/01/2022 1008   CHOLHDL 4.2 11/28/2020 0107   VLDL 13 11/28/2020 0107   LDLCALC 52 01/01/2022 1008        Physical Exam:    VS:  There were no vitals taken for this visit.    Wt Readings from Last 3 Encounters:  04/09/22 132 lb (59.9 kg)  01/01/22 141 lb (64 kg)  11/05/21 137 lb (62.1 kg)    Gen: *** distress, *** obese/well nourished/malnourished   Neck: No JVD, *** carotid bruit Ears: *** Frank Sign Cardiac: No Rubs or Gallops, *** Murmur, ***cardia, *** radial pulses Respiratory: Clear to auscultation bilaterally, *** effort, ***  respiratory rate GI: Soft, nontender, non-distended *** MS: No *** edema; *** moves all extremities Integument: Skin feels *** Neuro:  At time of evaluation, alert and oriented to person/place/time/situation *** Psych: Normal affect, patient feels ***   ASSESSMENT:    No diagnosis found.  PLAN:    Coronary Artery Disease; Obstructive Persistent atrial fibrillation - on Eliquis with elevated CHADSVASC 5 - continue statin, goal LDL < 55 lipids  - continue BB  (succinate 50 mg PO daily) - continue nitrates and PRN (none needed)    Heart Failure Improved Ejection  Fraction  Gout - NYHA class II, Stage C, likely euvolemic, etiology from AF  - Diuretic regimen: *** - Discussed the importance of fluid restriction of < 2 L, salt restriction, and checking daily weights   - SGLT2i would be cost prohibitive presently - will continue 50 mg succinate (low heart rates on 100 dose) - aldactone *** mg with low threshold to increase dose - EF recovered without use or ARB and ARNI; will defer presently - BNP and CMP today  Six months me or APP       Medication Adjustments/Labs and Tests Ordered: Current medicines are reviewed at length with the patient today.  Concerns regarding medicines are outlined above.   No orders of the defined types were placed in this encounter.  No orders of the defined types were placed in this encounter.   There are no Patient Instructions on file for this visit.   Signed, Werner Lean, MD  06/24/2022 12:23 PM    [place]

## 2022-06-25 ENCOUNTER — Ambulatory Visit: Payer: Medicare HMO | Admitting: Internal Medicine

## 2022-07-23 ENCOUNTER — Other Ambulatory Visit: Payer: Self-pay

## 2022-07-23 DIAGNOSIS — Z125 Encounter for screening for malignant neoplasm of prostate: Secondary | ICD-10-CM

## 2022-07-28 ENCOUNTER — Ambulatory Visit: Payer: Medicare HMO

## 2022-09-09 ENCOUNTER — Emergency Department (HOSPITAL_COMMUNITY): Payer: Medicare HMO

## 2022-09-09 ENCOUNTER — Other Ambulatory Visit: Payer: Self-pay

## 2022-09-09 ENCOUNTER — Emergency Department (HOSPITAL_COMMUNITY)
Admission: EM | Admit: 2022-09-09 | Discharge: 2022-09-10 | Disposition: A | Discharge status: Home / Self Care | Payer: Medicare HMO | Attending: Emergency Medicine | Admitting: Emergency Medicine

## 2022-09-09 DIAGNOSIS — M5416 Radiculopathy, lumbar region: Secondary | ICD-10-CM | POA: Insufficient documentation

## 2022-09-09 DIAGNOSIS — E860 Dehydration: Secondary | ICD-10-CM | POA: Insufficient documentation

## 2022-09-09 DIAGNOSIS — Z79899 Other long term (current) drug therapy: Secondary | ICD-10-CM | POA: Diagnosis not present

## 2022-09-09 DIAGNOSIS — W19XXXA Unspecified fall, initial encounter: Secondary | ICD-10-CM | POA: Diagnosis not present

## 2022-09-09 DIAGNOSIS — Z7901 Long term (current) use of anticoagulants: Secondary | ICD-10-CM | POA: Insufficient documentation

## 2022-09-09 DIAGNOSIS — G8929 Other chronic pain: Secondary | ICD-10-CM

## 2022-09-09 DIAGNOSIS — M5441 Lumbago with sciatica, right side: Secondary | ICD-10-CM | POA: Diagnosis not present

## 2022-09-09 DIAGNOSIS — M5442 Lumbago with sciatica, left side: Secondary | ICD-10-CM | POA: Insufficient documentation

## 2022-09-09 DIAGNOSIS — S0990XA Unspecified injury of head, initial encounter: Secondary | ICD-10-CM | POA: Insufficient documentation

## 2022-09-09 DIAGNOSIS — R531 Weakness: Secondary | ICD-10-CM

## 2022-09-09 LAB — CBC
HCT: 41.7 % (ref 39.0–52.0)
Hemoglobin: 13.5 g/dL (ref 13.0–17.0)
MCH: 29.2 pg (ref 26.0–34.0)
MCHC: 32.4 g/dL (ref 30.0–36.0)
MCV: 90.1 fL (ref 80.0–100.0)
Platelets: 238 10*3/uL (ref 150–400)
RBC: 4.63 MIL/uL (ref 4.22–5.81)
RDW: 14.9 % (ref 11.5–15.5)
WBC: 11.3 10*3/uL — ABNORMAL HIGH (ref 4.0–10.5)
nRBC: 0 % (ref 0.0–0.2)

## 2022-09-09 LAB — BASIC METABOLIC PANEL
Anion gap: 10 (ref 5–15)
BUN: 17 mg/dL (ref 8–23)
CO2: 27 mmol/L (ref 22–32)
Calcium: 8.3 mg/dL — ABNORMAL LOW (ref 8.9–10.3)
Chloride: 104 mmol/L (ref 98–111)
Creatinine, Ser: 1.23 mg/dL (ref 0.61–1.24)
GFR, Estimated: >60 mL/min (ref >60)
Glucose, Bld: 108 mg/dL — ABNORMAL HIGH (ref 70–99)
Potassium: 4 mmol/L (ref 3.5–5.1)
Sodium: 141 mmol/L (ref 135–145)

## 2022-09-09 LAB — URINALYSIS, ROUTINE W REFLEX MICROSCOPIC
Bacteria, UA: NONE SEEN
Bilirubin Urine: NEGATIVE
Glucose, UA: NEGATIVE mg/dL
Hgb urine dipstick: NEGATIVE
Ketones, ur: NEGATIVE mg/dL
Leukocytes,Ua: NEGATIVE
Nitrite: NEGATIVE
Protein, ur: 30 mg/dL — AB
Specific Gravity, Urine: 1.018 (ref 1.005–1.030)
pH: 6 (ref 5.0–8.0)

## 2022-09-09 LAB — CBG MONITORING, ED: Glucose-Capillary: 118 mg/dL — ABNORMAL HIGH (ref 70–99)

## 2022-09-09 LAB — TROPONIN I (HIGH SENSITIVITY)
Troponin I (High Sensitivity): 6 ng/L (ref <18)
Troponin I (High Sensitivity): 7 ng/L (ref <18)

## 2022-09-09 MED ORDER — SODIUM CHLORIDE 0.9 % IV BOLUS
1000.0000 mL | Freq: Once | INTRAVENOUS | Status: AC
  Administered 2022-09-09: 1000 mL via INTRAVENOUS

## 2022-09-09 NOTE — ED Provider Notes (Signed)
[facility] EMERGENCY DEPARTMENT Provider Note   CSN: [id number] Arrival date & time: 09/09/22  1518     History  Chief Complaint  Patient presents with   Weakness   Chest Pain    Peter Mclean is a 71 y.o. male  presenting to ED for low back pain and difficulty standing due to pain in his lower back that makes his legs feel weak. Pt states symptoms have been worse for the last 2 weeks but he has had symptoms for at least 9 months ago and was diagnosed with sciatica. He states pain has been so bad it caused him to fall down about 3 days ago on his right side. He also admits to hitting his head at that time. He is anticoagulated due to history of atrial fibrillation. He has baseline paresthesias to lower extremities but reports this is unchanged. He denies bowel or bladder dysfunction, dysuria, hematuria, decreased or increased urine output, chest pain, shortness of breath, nausea, vomiting, or diarrhea. Pt denies loss of consciousness with his recent fall. He reports he had L4-L5 laminectomy in January of this year but this did not resolve his pain and it has only continued to flare up and become worse. Reports due to his pain he has decreased appetite but this is nothing new for him and has been going on all year. He states he has discussed this with his primary care and has tried several medications for pain and nausea but he has no significant relief with any of them and no longer wants to try medications but would instead like to see if any further interventions can be done to control his symptoms.     Home Medications Prior to Admission medications   Medication Sig Start Date End Date Taking? Authorizing Provider  albuterol (VENTOLIN HFA) 108 (90 Base) MCG/ACT inhaler 1-2 puffs every 6 (six) hours as needed for wheezing. 10/18/20   [provider]  ALPRAZolam Prudy Feeler) 1 MG tablet Take 1 mg by mouth at bedtime.    [provider]  amiodarone (PACERONE)  200 MG tablet TAKE 1 TABLET BY MOUTH EVERY DAY 02/11/22   Chandrasekhar, Mahesh A, MD  apixaban (ELIQUIS) 5 MG TABS tablet Take 1 tablet (5 mg total) by mouth 2 (two) times daily. Restart on 10/06/21 10/04/21   Jadene Pierini, MD  atorvastatin (LIPITOR) 80 MG tablet Take 1 tablet (80 mg total) by mouth daily. 11/14/21   Duke, Roe Rutherford, PA  famotidine (PEPCID) 20 MG tablet Take 20 mg by mouth 2 (two) times daily. 04/02/22   [provider]  furosemide (LASIX) 20 MG tablet Take 1 tablet (20 mg total) by mouth every other day. 04/09/22   Gaston Islam., NP  gabapentin (NEURONTIN) 300 MG capsule Take 300 mg by mouth 3 (three) times daily. 09/18/19   [provider]  HYDROcodone-acetaminophen (NORCO/VICODIN) 5-325 MG tablet Take 1 tablet by mouth every 4 (four) hours as needed for pain. 09/13/21   [provider]  metoprolol succinate (TOPROL-XL) 100 MG 24 hr tablet Take 0.5 tablets (50 mg total) by mouth daily. Take with or immediately following a meal. 11/05/21   Sheilah Pigeon, PA-C  nitroGLYCERIN (NITROSTAT) 0.4 MG SL tablet PLACE 1 TABLET UNDER THE TONGUE EVERY 5 MINUTES AS NEEDED FOR CHEST PAIN. 11/25/21   Ronney Asters, NP  spironolactone (ALDACTONE) 25 MG tablet Take 0.5 tablets (12.5 mg total) by mouth daily. 12/21/20 01/01/22  Barrett, Joline Salt, PA-C  tamsulosin (FLOMAX) 0.4 MG CAPS capsule Take 0.4 mg by mouth daily after supper. 02/27/21   [provider]      Allergies    Tramadol and Clopidogrel    Review of Systems   Review of Systems  Constitutional:  Positive for activity change and appetite change. Negative for chills, diaphoresis and fever.  HENT:  Negative for congestion, ear pain, rhinorrhea, sore throat, trouble swallowing and voice change.   Eyes:  Negative for pain and visual disturbance.  Respiratory:  Negative for cough, choking, chest tightness, shortness of breath and wheezing.   Cardiovascular:  Negative for chest pain,  palpitations and leg swelling.  Gastrointestinal:  Positive for nausea. Negative for abdominal pain, constipation, diarrhea and vomiting.  Genitourinary:  Negative for difficulty urinating, dysuria, flank pain, frequency, hematuria, testicular pain and urgency.  Musculoskeletal:  Positive for back pain and gait problem. Negative for neck pain and neck stiffness.  Skin:  Negative for color change and rash.  Neurological:  Positive for weakness and numbness. Negative for dizziness, seizures, syncope, speech difficulty and headaches.  Psychiatric/Behavioral:  Negative for confusion.   All other systems reviewed and are negative.   Physical Exam Updated Vital Signs BP 105/79   Pulse 88   Temp 97.9 F (36.6 C) (Oral)   Resp 19   SpO2 96%  Physical Exam Vitals and nursing note reviewed.  Constitutional:      General: He is not in acute distress.    Appearance: Normal appearance. He is not ill-appearing, toxic-appearing or diaphoretic.  HENT:     Head: Normocephalic and atraumatic.     Mouth/Throat:     Mouth: Mucous membranes are moist.  Eyes:     Conjunctiva/sclera: Conjunctivae normal.  Cardiovascular:     Rate and Rhythm: Normal rate. Rhythm irregular.     Heart sounds: Normal heart sounds. No murmur heard. Pulmonary:     Effort: Pulmonary effort is normal.     Breath sounds: Normal breath sounds. No decreased breath sounds, wheezing, rhonchi or rales.  Abdominal:     General: Abdomen is flat.     Palpations: Abdomen is soft.     Tenderness: There is no abdominal tenderness. There is no guarding or rebound.  Musculoskeletal:        General: Normal range of motion.     Cervical back: Normal range of motion and neck supple.     Right lower leg: No tenderness. No edema.     Left lower leg: No tenderness. No edema.  Skin:    General: Skin is warm and dry.     Capillary Refill: Capillary refill takes 2 to 3 seconds.  Neurological:     General: No focal deficit present.      Mental Status: He is alert and oriented to person, place, and time. Mental status is at baseline.     Cranial Nerves: No cranial nerve deficit.     Motor: No weakness.  Psychiatric:        Mood and Affect: Mood normal.        Behavior: Behavior normal.     ED Results / Procedures / Treatments   Labs (all labs ordered are listed, but only abnormal results are displayed) Labs Reviewed  BASIC METABOLIC PANEL - Abnormal; Notable for the following components:      Result Value   Glucose, Bld 108 (*)    Calcium 8.3 (*)    All other components within normal limits  CBC - Abnormal; Notable  for the following components:   WBC 11.3 (*)    All other components within normal limits  URINALYSIS, ROUTINE W REFLEX MICROSCOPIC - Abnormal; Notable for the following components:   Color, Urine AMBER (*)    Protein, ur 30 (*)    All other components within normal limits  CBG MONITORING, ED - Abnormal; Notable for the following components:   Glucose-Capillary 118 (*)    All other components within normal limits  TROPONIN I (HIGH SENSITIVITY)  TROPONIN I (HIGH SENSITIVITY)    EKG EKG Interpretation  Date/Time:  Tuesday September 09 2022 15:45:04 EST Ventricular Rate:  100 PR Interval:    QRS Duration: 102 QT Interval:  400 QTC Calculation: 516 R Axis:   130 Text Interpretation: Atrial fibrillation Incomplete right bundle branch block Right ventricular hypertrophy Prolonged QT Abnormal ECG When compared with ECG of 14-Mar-2021 14:20, PREVIOUS ECG IS PRESENT Confirmed by Kommor, Madison ([id number]) on 09/10/2022 12:46:18 PM  Radiology Narrative & Impression  CLINICAL DATA:  Lumbar radiculopathy, symptoms persist with > 6 wks treatment   EXAM: CT LUMBAR SPINE WITHOUT CONTRAST   TECHNIQUE: Multidetector CT imaging of the lumbar spine was performed without intravenous contrast administration. Multiplanar CT image reconstructions were also generated.   RADIATION DOSE REDUCTION: This exam was  performed according to the departmental dose-optimization program which includes automated exposure control, adjustment of the mA and/or kV according to patient size and/or use of iterative reconstruction technique.   COMPARISON:  MRI 04/27/2021, CT 04/03/2022   FINDINGS: Segmentation: 5 lumbar type vertebrae.   Alignment: Normal.   Vertebrae: No acute fracture. Interval decompression at the L4-5 level with left laminectomy defect. No lytic or sclerotic bone lesion.   Paraspinal and other soft tissues: Aortic atherosclerosis. No acute findings.   Disc levels: Mild multilevel intervertebral disc height loss and mild lower lumbar facet joint arthropathy. Mild-to-moderate bilateral foraminal narrowing at L4-5. No findings to suggest high-grade canal stenosis by CT.   IMPRESSION: 1. Interval decompression at the L4-5 level. Mild-to-moderate bilateral foraminal narrowing at L4-5. No findings to suggest high-grade canal stenosis by CT. 2. Mild multilevel lumbar spondylosis without evidence of interval progression compared to the previous study. 3. Aortic atherosclerosis (ICD10-I70.0).      Procedures None  Medications Ordered in ED Medications  sodium chloride 0.9 % bolus 1,000 mL (0 mLs Intravenous Stopped 09/09/22 2239)    ED Course/ Medical Decision Making/ A&P                           Medical Decision Making Amount and/or Complexity of Data Reviewed Labs: ordered. Decision-making details documented in ED Course. Radiology: ordered. Decision-making details documented in ED Course. ECG/medicine tests: ordered. Decision-making details documented in ED Course.   71 year old male presenting to ED for acute on chronic back pain and lower extremity weakness. Pt has long-standing history of chronic back pain with sciatica and underwent laminectomy for symptoms earlier this year. Pt reports no significant improvement following surgery and was unhappy with neurosurgical team  and results. He reports with his pain he most often stays in bed and has not been eating and drinking well due to not wanting to move with his pain. On my initial evaluation, pt mildly hypotensive so 1L bolus given with good response. Pt remained normotensive following this and reported significant improvement in how he was feeling. With my initial exam and all repeat exams, pt remained neurologically intact with an otherwise unremarkable  exam. Nursing staff ambulated with pt around ED without pt experiencing any difficulty with ambulation or significant pain limiting his activity level. Of note, he also maintained oxygen saturation throughout ED stay while I was overseeing his care and with ambulation. For unknown reason, pt was placed on oxygen by EMS (per his report) but does not normally require oxygen though does have it available to use at home as needed. With pt reporting recent fall and head injury, I did obtain CT brain to rule out acute intracranial abnormality. This scan was negative and no signs of acute head trauma on exam. CT lumbar spine showed mild to moderate bilateral foraminal narrowing but with no high grade canal stenosis and normal neurological exam suspect this is progression of his chronic disease. Discussed with pt importance of neurosurgery follow-up. As he was unhappy with previous team, information provided for new neurosurgeon. Pt pleased with ED care and reports feeling significantly better and unsurprised symptoms are related to his chronic condition. He expressed desire to be discharged home and was agreeable to following up with neurosurgery. Daughter also updated on pt condition and findings via phone call. Aware pt should follow-up with neurosurgery. Vital signs stable. All pt and daughter's questions were answered prior to discharge with strict return precautions given.           Final Clinical Impression(s) / ED Diagnoses Final diagnoses:  Lumbar radiculopathy   Chronic bilateral low back pain with bilateral sciatica  Generalized weakness  Dehydration    Rx / DC Orders ED Discharge Orders     None         Tonette Lederer, PA-C 09/18/22 1610    Gloris Manchester, MD 09/24/22 [phone]

## 2022-09-09 NOTE — ED Provider Triage Note (Signed)
Emergency Medicine Provider Triage Evaluation Note  Peter Mclean , a 71 y.o. male  was evaluated in triage.  Pt complains of generalized weakness, chest pain, bilateral lower extremity numbness.  Patient denies urinary incontinence, retention, fecal incontinence, saddle anesthesia.  Patient does endorse multiple falls with the 7 falls over the past few weeks.  Patient states he fell twice over the past 24 hours including hitting his head when falling today.  He denies headache, loss of consciousness, blood thinner usage.  Endorses generalized weakness which has been ongoing for a few weeks.  He states he has bilateral lower extremity tingling/numbness and has had the same since having surgery for sciatica approximately 9 months ago.  Review of Systems  Positive: As above Negative: As above  Physical Exam  BP 94/74 (BP Location: Left Arm)   Pulse 96   Temp 97.7 F (36.5 C) (Oral)   Resp 16   SpO2 100%  Gen:   Awake, no distress   Resp:  Normal effort  MSK:   Moves extremities without difficulty  Other:    Medical Decision Making  Medically screening exam initiated at 3:48 PM.  Appropriate orders placed.  [name] was informed that the remainder of the evaluation will be completed by another provider, this initial triage assessment does not replace that evaluation, and the importance of remaining in the ED until their evaluation is complete.     Darrick Grinder, PA-C 09/09/22 1550

## 2022-09-09 NOTE — ED Notes (Signed)
Called pt x2 for vitals, no response. °

## 2022-09-09 NOTE — ED Notes (Signed)
Ambulated with minimal assistance 

## 2022-09-09 NOTE — ED Triage Notes (Addendum)
Pt to triage via [facility] from home.  Reports history of sciatica with back surgery 9 months ago.  Intermittent numbness and tingling to bilateral lower extremities.  Reports increased generalized weakness x 2-3 weeks with intermittent chest tightness.  Denies chest pain at present.  States he fell this afternoon due to weakness.    EMS- 20g IV RAC NS 300 CBG 225 2 L [place]

## 2022-09-10 NOTE — Discharge Instructions (Addendum)
Thank you for letting us take care of you today.  As discussed, your labs, imaging, and physical exam were reassuring and we believe you are safe to discharge home. You do have some chronic changes in your lower back that are worse than in the past and because of this I recommend you follow-up with neurosurgery to see if they would like to do any type of further intervention. I have provided this contact information for you or you may follow-up with the neurosurgeon who did your previous surgery.  Please stay well hydrated and eat and drink regularly as we discussed. Your blood pressure was low but responded well to fluids in the emergency department so I suspect you are dehydrated due to lack of nutrition from not eating and drinking.   Please follow-up with your primary care provider in the next few days to discuss your visit today.  Should you develop fever, shortness of breath, chest pain, urinating or having a bowel movement on yourself, weakness on one side or another, loss of consciousness, or other concerns, please be re-evaluated at the nearest emergency department.

## 2022-10-23 ENCOUNTER — Encounter (HOSPITAL_COMMUNITY): Payer: Self-pay | Admitting: *Deleted

## 2022-11-03 ENCOUNTER — Other Ambulatory Visit: Payer: Self-pay

## 2022-11-03 MED ORDER — ATORVASTATIN CALCIUM 80 MG PO TABS: 80.0000 mg | ORAL_TABLET | Freq: Every day | ORAL | 1 refills | Status: DC

## 2022-11-03 MED ORDER — ATORVASTATIN CALCIUM 80 MG PO TABS: 80.0000 mg | ORAL_TABLET | Freq: Every day | ORAL | 0 refills | Status: AC

## 2023-03-08 ENCOUNTER — Other Ambulatory Visit (HOSPITAL_COMMUNITY): Payer: Self-pay | Admitting: Internal Medicine

## 2023-04-04 ENCOUNTER — Other Ambulatory Visit (HOSPITAL_COMMUNITY): Payer: Self-pay | Admitting: Internal Medicine

## 2023-04-07 ENCOUNTER — Other Ambulatory Visit: Payer: Self-pay | Admitting: Nurse Practitioner

## 2023-04-17 ENCOUNTER — Other Ambulatory Visit (HOSPITAL_COMMUNITY): Payer: Self-pay | Admitting: Internal Medicine

## 2023-06-13 ENCOUNTER — Other Ambulatory Visit (HOSPITAL_COMMUNITY): Payer: Self-pay | Admitting: Internal Medicine

## 2023-06-24 ENCOUNTER — Other Ambulatory Visit (HOSPITAL_COMMUNITY): Payer: Self-pay | Admitting: Nurse Practitioner

## 2023-07-08 ENCOUNTER — Other Ambulatory Visit: Payer: Self-pay

## 2023-07-08 ENCOUNTER — Emergency Department (HOSPITAL_BASED_OUTPATIENT_CLINIC_OR_DEPARTMENT_OTHER): Payer: Medicare HMO

## 2023-07-08 ENCOUNTER — Encounter (HOSPITAL_BASED_OUTPATIENT_CLINIC_OR_DEPARTMENT_OTHER): Payer: Self-pay | Admitting: *Deleted

## 2023-07-08 ENCOUNTER — Emergency Department (HOSPITAL_BASED_OUTPATIENT_CLINIC_OR_DEPARTMENT_OTHER): Payer: Medicare HMO | Admitting: Radiology

## 2023-07-08 ENCOUNTER — Emergency Department (HOSPITAL_BASED_OUTPATIENT_CLINIC_OR_DEPARTMENT_OTHER)
Admission: EM | Admit: 2023-07-08 | Discharge: 2023-07-08 | Disposition: A | Discharge status: Home / Self Care | Payer: Medicare HMO | Attending: Emergency Medicine | Admitting: Emergency Medicine

## 2023-07-08 DIAGNOSIS — M542 Cervicalgia: Secondary | ICD-10-CM | POA: Insufficient documentation

## 2023-07-08 DIAGNOSIS — M25552 Pain in left hip: Secondary | ICD-10-CM | POA: Diagnosis not present

## 2023-07-08 DIAGNOSIS — W01198A Fall on same level from slipping, tripping and stumbling with subsequent striking against other object, initial encounter: Secondary | ICD-10-CM | POA: Diagnosis not present

## 2023-07-08 DIAGNOSIS — I509 Heart failure, unspecified: Secondary | ICD-10-CM | POA: Diagnosis not present

## 2023-07-08 DIAGNOSIS — W19XXXA Unspecified fall, initial encounter: Secondary | ICD-10-CM

## 2023-07-08 DIAGNOSIS — I6782 Cerebral ischemia: Secondary | ICD-10-CM | POA: Diagnosis not present

## 2023-07-08 DIAGNOSIS — S5012XA Contusion of left forearm, initial encounter: Secondary | ICD-10-CM | POA: Insufficient documentation

## 2023-07-08 DIAGNOSIS — Z8673 Personal history of transient ischemic attack (TIA), and cerebral infarction without residual deficits: Secondary | ICD-10-CM | POA: Diagnosis not present

## 2023-07-08 DIAGNOSIS — Z7901 Long term (current) use of anticoagulants: Secondary | ICD-10-CM | POA: Diagnosis not present

## 2023-07-08 DIAGNOSIS — S59912A Unspecified injury of left forearm, initial encounter: Secondary | ICD-10-CM | POA: Diagnosis present

## 2023-07-08 MED ORDER — ACETAMINOPHEN 325 MG PO TABS
650.0000 mg | ORAL_TABLET | Freq: Once | ORAL | Status: AC
  Administered 2023-07-08: 650 mg via ORAL
  Filled 2023-07-08: qty 2

## 2023-07-08 NOTE — ED Triage Notes (Signed)
Pt is here for evaluation post fall on hardwood floor this am, he reports that he struck his head.  Daughter brought him in and she states that he has had frequent "about 200 in the past year".  Pt denies any LOC with fall this am.  Pt also reports that he had throat pain and has seen an ENT for this.  Pt is on [place].

## 2023-07-08 NOTE — ED Provider Notes (Signed)
[place] EMERGENCY DEPARTMENT AT [facility] Provider Note   CSN: [id number] Arrival date & time: 07/08/23  1938     History  Chief Complaint  Patient presents with   Peter Mclean is a 72 y.o. male with a history of CHF, atrial fibrillation, and lumbar stenosis with neurogenic claudication who presents the ED today after a fall.  Patient reports he fell today on hardwood floor and struck his head.  Patient denies LOC but admits to Eliquis use.  He reports pain to his head and neck from today's fall.  Patient states that yesterday he fell on concrete and hurt his right arm and left hip.  Patient states he lives with his daughter and uses a cane or walker "most of the time."  Denies dizziness, weakness, changes to vision or speech.      Home Medications Prior to Admission medications   Medication Sig Start Date End Date Taking? Authorizing Provider  albuterol (VENTOLIN HFA) 108 (90 Base) MCG/ACT inhaler 1-2 puffs every 6 (six) hours as needed for wheezing. 10/18/20   [provider]  ALPRAZolam Prudy Feeler) 1 MG tablet Take 1 mg by mouth at bedtime.    [provider]  amiodarone (PACERONE) 200 MG tablet Take 1 tablet (200 mg total) by mouth daily. Pt needs to schedule appt with provider for a 90 day supply - 3rd and final attempt 06/24/23   Gaston Islam., NP  apixaban (ELIQUIS) 5 MG TABS tablet Take 1 tablet (5 mg total) by mouth 2 (two) times daily. Restart on 10/06/21 10/04/21   Jadene Pierini, MD  atorvastatin (LIPITOR) 80 MG tablet Take 1 tablet (80 mg total) by mouth daily. 11/03/22   Duke, Roe Rutherford, PA  famotidine (PEPCID) 20 MG tablet Take 20 mg by mouth 2 (two) times daily. 04/02/22   [provider]  furosemide (LASIX) 20 MG tablet TAKE 1 TABLET BY MOUTH EVERY OTHER DAY 04/07/23   Gaston Islam., NP  gabapentin (NEURONTIN) 300 MG capsule Take 300 mg by mouth 3 (three) times daily. 09/18/19   [provider]   HYDROcodone-acetaminophen (NORCO/VICODIN) 5-325 MG tablet Take 1 tablet by mouth every 4 (four) hours as needed for pain. 09/13/21   [provider]  metoprolol succinate (TOPROL-XL) 100 MG 24 hr tablet Take 0.5 tablets (50 mg total) by mouth daily. Take with or immediately following a meal. 11/05/21   Sheilah Pigeon, PA-C  nitroGLYCERIN (NITROSTAT) 0.4 MG SL tablet PLACE 1 TABLET UNDER THE TONGUE EVERY 5 MINUTES AS NEEDED FOR CHEST PAIN. 11/25/21   Ronney Asters, NP  spironolactone (ALDACTONE) 25 MG tablet Take 0.5 tablets (12.5 mg total) by mouth daily. 12/21/20 01/01/22  Barrett, Joline Salt, PA-C  tamsulosin (FLOMAX) 0.4 MG CAPS capsule Take 0.4 mg by mouth daily after supper. 02/27/21   [provider]      Allergies    Tramadol and Clopidogrel    Review of Systems   Review of Systems  Constitutional:        Fall  All other systems reviewed and are negative.   Physical Exam Updated Vital Signs BP 112/81   Pulse 65   Temp 98.3 F (36.8 C) (Oral)   Resp 16   SpO2 95%  Physical Exam Vitals and nursing note reviewed.  Constitutional:      General: He is not in acute distress.    Appearance: Normal appearance.  HENT:     Head:  Normocephalic and atraumatic.     Mouth/Throat:     Mouth: Mucous membranes are moist.  Eyes:     Conjunctiva/sclera: Conjunctivae normal.     Pupils: Pupils are equal, round, and reactive to light.  Neck:     Comments: Midline tenderness, step-off or deformity.  Range of motion intact. Cardiovascular:     Rate and Rhythm: Normal rate and regular rhythm.     Pulses: Normal pulses.     Heart sounds: Normal heart sounds.  Pulmonary:     Effort: Pulmonary effort is normal.     Breath sounds: Normal breath sounds.  Abdominal:     Palpations: Abdomen is soft.     Tenderness: There is no abdominal tenderness.  Musculoskeletal:        General: Tenderness present.     Cervical back: Normal range of motion. Tenderness present.      Comments: Tenderness to palpation of the right upper extremity, range of motion limited due to pain. Left hip tenderness without obvious deformity. Otherwise ROM and strength of extremities intact.  Skin:    General: Skin is warm and dry.     Findings: Bruising present. No rash.     Comments: Bruising on left forearm from prior fall  Neurological:     General: No focal deficit present.     Mental Status: He is alert.     Sensory: No sensory deficit.     Motor: No weakness.  Psychiatric:        Mood and Affect: Mood normal.        Behavior: Behavior normal.    ED Results / Procedures / Treatments   Labs (all labs ordered are listed, but only abnormal results are displayed) Labs Reviewed - No data to display  EKG None  Radiology DG Pelvis Portable  Result Date: 07/08/2023 CLINICAL DATA:  Status post fall.  Pain. EXAM: PORTABLE PELVIS 1-2 VIEWS COMPARISON:  None Available. FINDINGS: The cortical margins of the bony pelvis are intact. No fracture. Pubic symphysis and sacroiliac joints are congruent. Both femoral heads are well-seated in the respective acetabula. IMPRESSION: No pelvic fracture. Electronically Signed   By: Narda Rutherford M.D.   On: 07/08/2023 23:18   DG Forearm Right  Result Date: 07/08/2023 CLINICAL DATA:  Status post fall.  Pain. EXAM: RIGHT FOREARM - 2 VIEW COMPARISON:  None Available. FINDINGS: There is no evidence of fracture or other focal bone lesions. No elbow or wrist dislocation. Soft tissues are unremarkable. IMPRESSION: No fracture of the right forearm. Electronically Signed   By: Narda Rutherford M.D.   On: 07/08/2023 23:17   DG Shoulder Right  Result Date: 07/08/2023 CLINICAL DATA:  Status post fall with right arm pain. EXAM: RIGHT HUMERUS - 2+ VIEW; RIGHT SHOULDER - 2+ VIEW COMPARISON:  None Available. FINDINGS: Shoulder: No fracture or dislocation. Mild acromioclavicular degenerative change. Mild soft tissue edema seen laterally. Humerus: No acute  fracture of the humerus. Elbow alignment is suboptimally assessed on provided views, but no evidence of elbow dislocation. Slight soft tissue edema laterally. IMPRESSION: 1. No fracture or dislocation of the right shoulder or humerus. 2. Mild soft tissue edema laterally. Electronically Signed   By: Narda Rutherford M.D.   On: 07/08/2023 23:17   DG Humerus Right  Result Date: 07/08/2023 CLINICAL DATA:  Status post fall with right arm pain. EXAM: RIGHT HUMERUS - 2+ VIEW; RIGHT SHOULDER - 2+ VIEW COMPARISON:  None Available. FINDINGS: Shoulder: No fracture or dislocation. Mild acromioclavicular degenerative  change. Mild soft tissue edema seen laterally. Humerus: No acute fracture of the humerus. Elbow alignment is suboptimally assessed on provided views, but no evidence of elbow dislocation. Slight soft tissue edema laterally. IMPRESSION: 1. No fracture or dislocation of the right shoulder or humerus. 2. Mild soft tissue edema laterally. Electronically Signed   By: Narda Rutherford M.D.   On: 07/08/2023 23:17   CT Cervical Spine Wo Contrast  Result Date: 07/08/2023 CLINICAL DATA:  Neck trauma. Patient fell this morning, striking head. EXAM: CT CERVICAL SPINE WITHOUT CONTRAST TECHNIQUE: Multidetector CT imaging of the cervical spine was performed without intravenous contrast. Multiplanar CT image reconstructions were also generated. RADIATION DOSE REDUCTION: This exam was performed according to the departmental dose-optimization program which includes automated exposure control, adjustment of the mA and/or kV according to patient size and/or use of iterative reconstruction technique. COMPARISON:  CT neck 10/07/2022 FINDINGS: Alignment: Normal alignment. Skull base and vertebrae: No acute fracture. No primary bone lesion or focal pathologic process. Soft tissues and spinal canal: No prevertebral fluid or swelling. No visible canal hematoma. Disc levels: Degenerative changes with disc space narrowing and  endplate osteophyte formation throughout. Degenerative changes in the facet joints. Mild uncovertebral spurring causes some bone encroachment upon neural foramina bilaterally. Upper chest: Emphysematous changes in the lung apices. Other: None. IMPRESSION: Normal alignment. No acute displaced fractures identified. Multilevel degenerative changes. Electronically Signed   By: Burman Nieves M.D.   On: 07/08/2023 22:10   CT Head Wo Contrast  Result Date: 07/08/2023 CLINICAL DATA:  Patient fell today striking the head.  On Eliquis. EXAM: CT HEAD WITHOUT CONTRAST TECHNIQUE: Contiguous axial images were obtained from the base of the skull through the vertex without intravenous contrast. RADIATION DOSE REDUCTION: This exam was performed according to the departmental dose-optimization program which includes automated exposure control, adjustment of the mA and/or kV according to patient size and/or use of iterative reconstruction technique. COMPARISON:  09/09/2022 FINDINGS: Brain: Diffuse cerebral atrophy. Ventricular dilatation consistent with central atrophy. Focal low-attenuation in the right inferior cerebellum consistent with old infarct, unchanged. Low-attenuation changes in the deep white matter consistent with small vessel ischemia. No abnormal extra-axial fluid collections. No mass effect or midline shift. Gray-white matter junctions are distinct. Basal cisterns are not effaced. No acute intracranial hemorrhage. Vascular: No hyperdense vessel or unexpected calcification. Skull: Normal. Negative for fracture or focal lesion. Sinuses/Orbits: Paranasal sinuses and mastoid air cells are clear. Other: None. IMPRESSION: No acute intracranial abnormalities. Chronic atrophy and small vessel ischemic changes. Old right cerebellar infarct. Electronically Signed   By: Burman Nieves M.D.   On: 07/08/2023 22:07   DG Ribs Unilateral W/Chest Left  Result Date: 07/08/2023 CLINICAL DATA:  Left rib pain after a fall.  Frequent falls over the last year. EXAM: LEFT RIBS AND CHEST - 3+ VIEW COMPARISON:  09/09/2022 FINDINGS: Normal heart size and pulmonary vascularity. No focal airspace disease or consolidation in the lungs. No blunting of costophrenic angles. No pneumothorax. Mediastinal contours appear intact. Degenerative changes in the spine. Left ribs appear intact. No acute displaced fractures are identified. No focal bone lesion or bone destruction. Soft tissues are unremarkable. IMPRESSION: No evidence of active pulmonary disease.  Negative left ribs. Electronically Signed   By: Burman Nieves M.D.   On: 07/08/2023 22:04    Procedures Procedures: not indicated.   Medications Ordered in ED Medications  acetaminophen (TYLENOL) tablet 650 mg (650 mg Oral Given 07/08/23 2222)    ED Course/ Medical Decision Making/ A&P  Medical Decision Making Amount and/or Complexity of Data Reviewed Radiology: ordered.  Risk OTC drugs.   This patient presents to the ED for concern of fall, this involves an extensive number of treatment options, and is a complaint that carries with it a high risk of complications and morbidity.   Differential diagnosis includes: SAH, SDH, concussion, fracture, dislocation, contusion, hematoma, etc.   Comorbidities  See HPI above   Additional History  Additional history obtained from previous records.   Cardiac Monitoring / EKG  The patient was maintained on a cardiac monitor.  I personally viewed and interpreted the cardiac monitored which showed: atrial fibrillation with a heart rate of 104 bpm.   Imaging Studies  I ordered imaging studies including CT head and cervical spine, left rib x-ray, right arm and shoulder x-ray, pelvis x-ray  I independently visualized and interpreted imaging which showed:  No acute findings on head and neck CT. Old right cerebellar infarct. Negative left rib imaging. Swelling of the soft tissue of the  right lateral shoulder and forearm otherwise no acute fractures or dislocations. Right forearm and pelvis imaging are unremarkable. I agree with the radiologist interpretation   Problem List / ED Course / Critical Interventions / Medication Management  Fall I ordered medications including: Acetaminophen for pain  Reevaluation of the patient after these medicines showed that the patient improved I have reviewed the patients home medicines and have made adjustments as needed   Social Determinants of Health  Physical activity   Test / Admission - Considered  Discussed findings with patient.  He is hemodynamically stable and safe for discharge home Return precautions provided.       Final Clinical Impression(s) / ED Diagnoses Final diagnoses:  Fall, initial encounter    Rx / DC Orders ED Discharge Orders     None         Maxwell Marion, PA-C 07/08/23 2326    Elayne Snare K, DO 07/08/23 2328

## 2023-07-08 NOTE — ED Provider Triage Note (Signed)
Emergency Medicine Provider Triage Evaluation Note  Peter Mclean , a 72 y.o. male  was evaluated in triage.  Pt complains of fall.  Patient fell backwards this a.m. and struck the back of his head.  Patient complains of pain in his head pain in his neck and pain in his ribs.  Patient is currently on a blood thinner.  Has had multiple recent falls.  Review of Systems  Positive: Headache Negative: Positive consciousness  Physical Exam  BP 129/78 (BP Location: Left Arm)   Pulse 99   Temp 98.3 F (36.8 C) (Oral)   Resp 16   SpO2 93%  Gen:   Awake, no distress   Resp:  Normal effort  MSK:   Moves extremities without difficulty  Other:    Medical Decision Making  Medically screening exam initiated at 8:01 PM.  Appropriate orders placed.  [name] was informed that the remainder of the evaluation will be completed by another provider, this initial triage assessment does not replace that evaluation, and the importance of remaining in the ED until their evaluation is complete.     Elson Areas, [place] 07/08/23 2001

## 2023-07-08 NOTE — Discharge Instructions (Signed)
Ridging of your head and neck is unremarkable.  No signs of intracranial injury.  You do have some soft tissue swelling of your shoulder and right upper arm otherwise imaging is unremarkable.  Take over-the-counter analgesics like Tylenol as needed for pain.  You can apply ice to your shoulder and upper arm to help reduce swelling and pain.  Follow-up with your PCP in 3 to 5 days for reevaluation of your symptoms.  Return to the ED if your symptoms worsen in the interim

## 2023-07-09 ENCOUNTER — Other Ambulatory Visit: Payer: Self-pay | Admitting: Nurse Practitioner

## 2023-07-21 ENCOUNTER — Other Ambulatory Visit (HOSPITAL_COMMUNITY): Payer: Self-pay | Admitting: Internal Medicine

## 2023-12-04 ENCOUNTER — Other Ambulatory Visit (HOSPITAL_COMMUNITY): Payer: Self-pay | Admitting: Nurse Practitioner

## 2024-01-05 ENCOUNTER — Other Ambulatory Visit (HOSPITAL_COMMUNITY): Payer: Self-pay | Admitting: Nurse Practitioner

## 2024-03-11 ENCOUNTER — Other Ambulatory Visit (HOSPITAL_COMMUNITY): Payer: Self-pay | Admitting: Nurse Practitioner

## 2024-03-15 NOTE — Telephone Encounter (Signed)
 Pt of Dr. Santo. Last seen by Jackee Alberts PA on 04/09/22. Past his 3rd attempt and no F/U is scheduled. Does Dr. Santo want to refill? Please advise.

## 2024-04-14 ENCOUNTER — Other Ambulatory Visit: Payer: Self-pay | Admitting: Nurse Practitioner

## 2024-05-18 ENCOUNTER — Other Ambulatory Visit: Payer: Self-pay | Admitting: Nurse Practitioner

## 2024-06-10 ENCOUNTER — Other Ambulatory Visit: Payer: Self-pay | Admitting: Nurse Practitioner

## 2024-07-10 ENCOUNTER — Other Ambulatory Visit: Payer: Self-pay | Admitting: Nurse Practitioner
